# Patient Record
Sex: Female | Born: 1945 | Race: White | Hispanic: No | Marital: Married | Smoking: Never smoker
Health system: Southern US, Community
[De-identification: ages and names within clinical notes are randomized; demographics above are authoritative.]

## PROBLEM LIST (undated history)

## (undated) DIAGNOSIS — I10 Essential (primary) hypertension: Secondary | ICD-10-CM

## (undated) DIAGNOSIS — I890 Lymphedema, not elsewhere classified: Secondary | ICD-10-CM

## (undated) DIAGNOSIS — E7800 Pure hypercholesterolemia, unspecified: Secondary | ICD-10-CM

## (undated) DIAGNOSIS — E785 Hyperlipidemia, unspecified: Secondary | ICD-10-CM

## (undated) DIAGNOSIS — L03119 Cellulitis of unspecified part of limb: Secondary | ICD-10-CM

## (undated) DIAGNOSIS — I509 Heart failure, unspecified: Secondary | ICD-10-CM

## (undated) DIAGNOSIS — L409 Psoriasis, unspecified: Secondary | ICD-10-CM

## (undated) DIAGNOSIS — R011 Cardiac murmur, unspecified: Secondary | ICD-10-CM

## (undated) DIAGNOSIS — R7989 Other specified abnormal findings of blood chemistry: Secondary | ICD-10-CM

## (undated) HISTORY — DX: Cardiac murmur, unspecified: R01.1

## (undated) HISTORY — PX: ANKLE FRACTURE SURGERY: SHX122

## (undated) HISTORY — DX: Hyperlipidemia, unspecified: E78.5

## (undated) HISTORY — PX: CATARACT EXTRACTION: SUR2

## (undated) HISTORY — DX: Lymphedema, not elsewhere classified: I89.0

## (undated) HISTORY — PX: ABDOMINAL HERNIA REPAIR: SHX539

## (undated) HISTORY — PX: CHOLECYSTECTOMY: SHX55

## (undated) HISTORY — DX: Essential (primary) hypertension: I10

## (undated) HISTORY — DX: Heart failure, unspecified: I50.9

## (undated) HISTORY — DX: Pure hypercholesterolemia, unspecified: E78.00

## (undated) HISTORY — DX: Psoriasis, unspecified: L40.9

## (undated) HISTORY — DX: Cellulitis of unspecified part of limb: L03.119

## (undated) HISTORY — DX: Other specified abnormal findings of blood chemistry: R79.89

## (undated) HISTORY — PX: ABDOMINAL HYSTERECTOMY: SHX81

---

## 2009-09-24 ENCOUNTER — Inpatient Hospital Stay (HOSPITAL_COMMUNITY): Admission: EM | Admit: 2009-09-24 | Discharge: 2009-09-29 | Payer: Self-pay | Admitting: Emergency Medicine

## 2011-02-09 LAB — COMPREHENSIVE METABOLIC PANEL WITH GFR
AST: 22 U/L (ref 0–37)
Albumin: 3.7 g/dL (ref 3.5–5.2)
Alkaline Phosphatase: 84 U/L (ref 39–117)
BUN: 8 mg/dL (ref 6–23)
Chloride: 107 meq/L (ref 96–112)
GFR calc Af Amer: >60 mL/min (ref >60)
Potassium: 3.4 meq/L — ABNORMAL LOW (ref 3.5–5.1)
Total Bilirubin: 1.8 mg/dL — ABNORMAL HIGH (ref 0.3–1.2)

## 2011-02-09 LAB — MAGNESIUM: Magnesium: 2 mg/dL (ref 1.5–2.5)

## 2011-02-09 LAB — DIFFERENTIAL
Basophils Absolute: 0 10*3/uL (ref 0.0–0.1)
Basophils Relative: 0 % (ref 0–1)
Eosinophils Absolute: 0 10*3/uL (ref 0.0–0.7)
Eosinophils Relative: 0 % (ref 0–5)
Lymphocytes Relative: 11 % — ABNORMAL LOW (ref 12–46)
Lymphs Abs: 1 10*3/uL (ref 0.7–4.0)
Monocytes Absolute: 0.7 10*3/uL (ref 0.1–1.0)
Monocytes Relative: 8 % (ref 3–12)

## 2011-02-09 LAB — COMPREHENSIVE METABOLIC PANEL
ALT: 25 U/L (ref 0–35)
CO2: 26 mEq/L (ref 19–32)
Calcium: 10.1 mg/dL (ref 8.4–10.5)
Creatinine, Ser: 0.66 mg/dL (ref 0.4–1.2)
GFR calc non Af Amer: >60 mL/min (ref >60)
Glucose, Bld: 135 mg/dL — ABNORMAL HIGH (ref 70–99)
Sodium: 140 mEq/L (ref 135–145)
Total Protein: 7.8 g/dL (ref 6.0–8.3)

## 2011-02-09 LAB — URINE CULTURE

## 2011-02-09 LAB — CBC
HCT: 42 % (ref 36.0–46.0)
Hemoglobin: 14.5 g/dL (ref 12.0–15.0)
MCV: 91.9 fL (ref 78.0–100.0)
MCV: 91.9 fL (ref 78.0–100.0)
Platelets: 160 10*3/uL (ref 150–400)
RBC: 4.56 MIL/uL (ref 3.87–5.11)
RDW: 14.1 % (ref 11.5–15.5)
WBC: 8.7 10*3/uL (ref 4.0–10.5)
WBC: 9 10*3/uL (ref 4.0–10.5)

## 2011-02-09 LAB — BASIC METABOLIC PANEL
BUN: 8 mg/dL (ref 6–23)
Creatinine, Ser: 0.63 mg/dL (ref 0.4–1.2)
GFR calc non Af Amer: >60 mL/min (ref >60)

## 2011-02-09 LAB — URINALYSIS, ROUTINE W REFLEX MICROSCOPIC
Nitrite: POSITIVE — AB
Specific Gravity, Urine: 1.023 (ref 1.005–1.030)
pH: 6 (ref 5.0–8.0)

## 2011-02-09 LAB — PROTIME-INR: INR: 1.11 (ref 0.00–1.49)

## 2011-02-09 LAB — URINE MICROSCOPIC-ADD ON

## 2011-02-09 LAB — APTT: aPTT: 28 seconds (ref 24–37)

## 2011-11-05 IMAGING — CR DG ABDOMEN 1V
1 series · 1 of 1 positions shown · non-contrast
Comparison: 09/27/2009

CLINICAL DATA: Small bowel obstruction.

ABDOMEN - 1 VIEW

[w abdomen upright *]
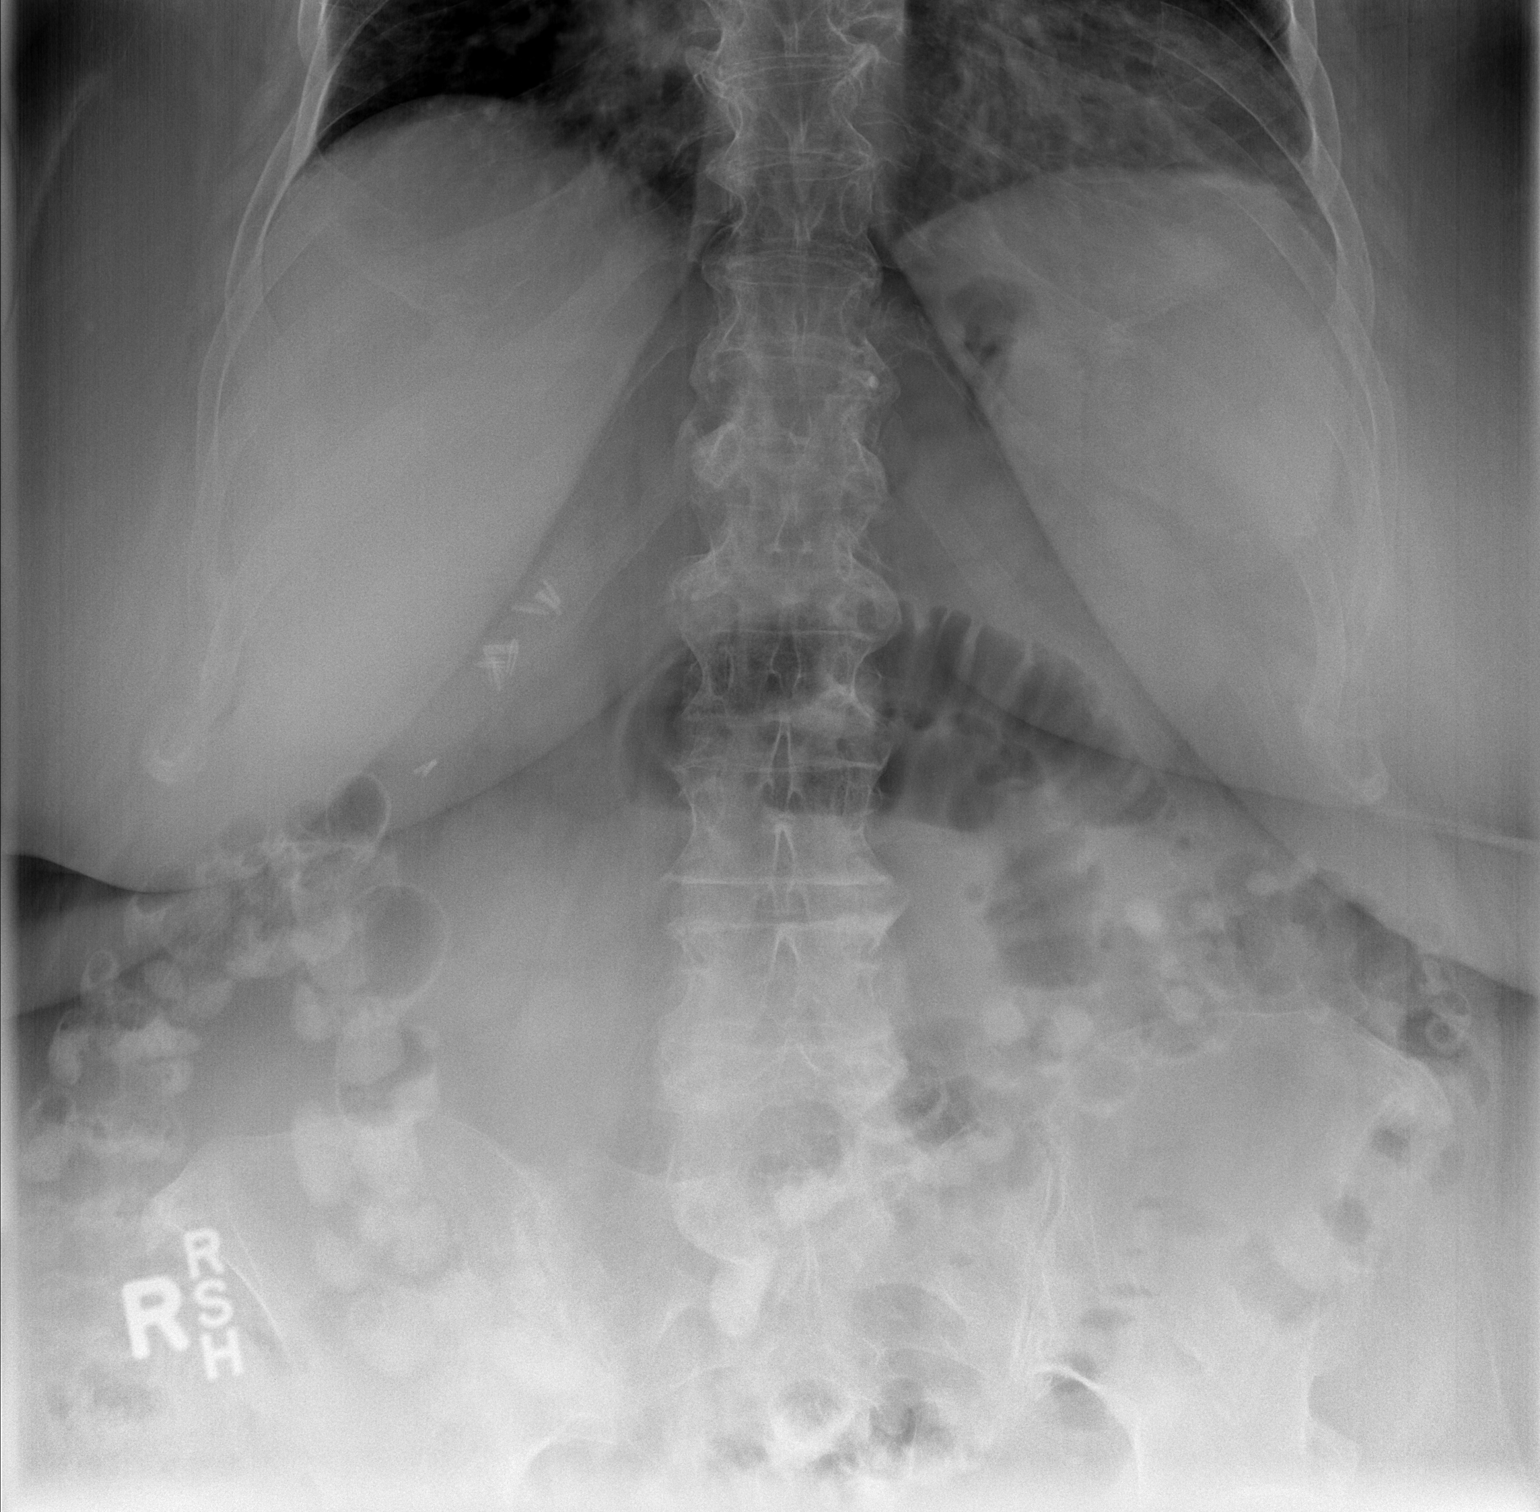

[1 of 1 positions shown; findings below may reference images not displayed]

FINDINGS: Dilated loops of small bowel are seen in the mid abdomen,
with associated air fluid levels.  Colon is nondistended and
contains oral contrast.  Surgical clips are seen in the right upper
quadrant.
IMPRESSION: Bowel gas pattern suggests persistent partial small bowel
obstruction.

## 2021-05-12 ENCOUNTER — Ambulatory Visit (INDEPENDENT_AMBULATORY_CARE_PROVIDER_SITE_OTHER): Payer: Medicare Other | Admitting: Dermatology

## 2021-05-12 ENCOUNTER — Other Ambulatory Visit: Payer: Self-pay

## 2021-05-12 ENCOUNTER — Encounter: Payer: Self-pay | Admitting: Dermatology

## 2021-05-12 DIAGNOSIS — C4492 Squamous cell carcinoma of skin, unspecified: Secondary | ICD-10-CM

## 2021-05-12 DIAGNOSIS — L409 Psoriasis, unspecified: Secondary | ICD-10-CM | POA: Diagnosis not present

## 2021-05-12 DIAGNOSIS — L281 Prurigo nodularis: Secondary | ICD-10-CM | POA: Diagnosis not present

## 2021-05-12 DIAGNOSIS — D0472 Carcinoma in situ of skin of left lower limb, including hip: Secondary | ICD-10-CM

## 2021-05-12 DIAGNOSIS — D485 Neoplasm of uncertain behavior of skin: Secondary | ICD-10-CM

## 2021-05-12 HISTORY — DX: Squamous cell carcinoma of skin, unspecified: C44.92

## 2021-05-12 MED ORDER — CLOBETASOL PROPIONATE 0.05 % EX LOTN: 1.0000 "application " | TOPICAL_LOTION | Freq: Every day | CUTANEOUS | 0 refills | Status: DC

## 2021-05-12 MED ORDER — MUPIROCIN 2 % EX OINT: 1.0000 "application " | TOPICAL_OINTMENT | Freq: Every day | CUTANEOUS | 0 refills | Status: DC

## 2021-05-12 MED ORDER — CALCIPOTRIENE 0.005 % EX CREA: TOPICAL_CREAM | Freq: Every day | CUTANEOUS | 0 refills | Status: DC

## 2021-05-12 NOTE — Patient Instructions (Signed)

## 2021-05-20 ENCOUNTER — Telehealth: Payer: Self-pay | Admitting: *Deleted

## 2021-05-20 NOTE — Telephone Encounter (Signed)
Path to patient made surgery with Dr.Tafeen.

## 2021-05-20 NOTE — Telephone Encounter (Signed)
-----   Message from [name], MD sent at 05/20/2021  6:37 AM EDT ----- Schedule surgery with Dr. Darene Lamer

## 2021-05-20 NOTE — Telephone Encounter (Signed)
No Voicemail set up.

## 2021-05-26 ENCOUNTER — Encounter: Payer: Self-pay | Admitting: Dermatology

## 2021-05-26 NOTE — Progress Notes (Signed)
   New Patient   Subjective  Carrie Guzman is a 75 y.o. female who presents for the following: New Patient (Initial Visit) (Patient here today for psoriasis x 10 years current treatment Clobetasol Lotion and Calcipotriene Cream (given by Dermatologist in [place]) per patient the treatment does help. Psoriasis on upper back. No personal history or family history of atypical moles, melanoma or non mole skin cancer. ).  Psoriasis mainly on back of scalp neck, nonhealing crust on left leg. Location:  Duration:  Quality:  Associated Signs/Symptoms: Modifying Factors:  Severity:  Timing: Context:    The following portions of the chart were reviewed this encounter and updated as appropriate:      Objective  Well appearing patient in no apparent distress; mood and affect are within normal limits. 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     A full examination was performed including scalp, head, eyes, ears, nose, lips, neck, chest, axillae, abdomen, back, buttocks, bilateral upper extremities, bilateral lower extremities, hands, feet, fingers, toes, fingernails, and toenails. All findings within normal limits unless otherwise noted below.  Is beneath undergarments not fully examined.   Assessment & Plan  Neoplasm of uncertain behavior of skin Left Lower Leg - Anterior  Skin / nail biopsy Type of biopsy: tangential   Informed consent: discussed and consent obtained   Timeout: patient name, date of birth, surgical site, and procedure verified   Procedure prep:  Patient was prepped and draped in usual sterile fashion (Non sterile) Prep type:  Chlorhexidine Anesthesia: the lesion was anesthetized in a standard fashion   Anesthetic:  1%  lidocaine w/ epinephrine 1-100,000 local infiltration Instrument used: flexible razor blade   Hemostasis achieved with: ferric subsulfate   Outcome: patient tolerated procedure well   Post-procedure details: sterile dressing applied and wound care instructions given   Dressing type: pressure dressing and petrolatum    Specimen 1 - Surgical pathology Differential Diagnosis: R/O BCC vs SCC  Check Margins: No  Related Medications mupirocin ointment (BACTROBAN) 2 % Apply 1 application topically daily.  Prurigo nodularis Left Cymba  Discussed the difficulty of stopping a chronic scratch-itch cycle.  Use topical samples of Pandel x 3 samples given Lot # [id number] Exp 08/22  Psoriasis Neck - Posterior  Again discussed how scratching will prevent psoriasis from improving.  Okay use of clobetasol daily for 1 month and taper if improved.  Avoid use on face.  Related Medications Clobetasol Propionate 0.05 % lotion Apply 1 application topically daily.  calcipotriene (DOVONOX) 0.005 % cream Apply topically daily.

## 2021-07-22 ENCOUNTER — Other Ambulatory Visit: Payer: Self-pay

## 2021-07-22 ENCOUNTER — Encounter: Payer: Self-pay | Admitting: Dermatology

## 2021-07-22 ENCOUNTER — Ambulatory Visit (INDEPENDENT_AMBULATORY_CARE_PROVIDER_SITE_OTHER): Payer: Medicare Other | Admitting: Dermatology

## 2021-07-22 DIAGNOSIS — D099 Carcinoma in situ, unspecified: Secondary | ICD-10-CM

## 2021-07-22 DIAGNOSIS — D0472 Carcinoma in situ of skin of left lower limb, including hip: Secondary | ICD-10-CM

## 2021-07-22 NOTE — Patient Instructions (Signed)

## 2021-08-02 ENCOUNTER — Encounter: Payer: Self-pay | Admitting: Dermatology

## 2021-08-02 NOTE — Progress Notes (Signed)
   Follow-Up Visit   Subjective  Carrie Guzman is a 75 y.o. female who presents for the following: Procedure (Here for treatment- left lower leg- anterior- cis x 1).  Carcinoma in situ left shin Location:  Duration:  Quality:  Associated Signs/Symptoms: Modifying Factors:  Severity:  Timing: Context:   Objective  Well appearing patient in no apparent distress; mood and affect are within normal limits. Left Lower Leg - Anterior Lesion identified by Dr.Adayah Arocho and nurse in room.      A focused examination was performed including legs, arms.. Relevant physical exam findings are noted in the Assessment and Plan.   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  Destruction of lesion Complexity: simple   Destruction method: electrodesiccation and curettage   Informed consent: discussed and consent obtained   Timeout:  patient name, date of birth, surgical site, and procedure verified Anesthesia: the lesion was anesthetized in a standard fashion   Anesthetic:  1% lidocaine w/ epinephrine 1-100,000 local infiltration Curettage performed in three different directions: Yes   Curettage cycles:  3 Lesion length (cm):  1.5 Lesion width (cm):  1.5 Margin per side (cm):  0 Final wound size (cm):  1.5 Hemostasis achieved with:  ferric subsulfate Outcome: patient tolerated procedure well with no complications   Post-procedure details: sterile dressing applied and wound care instructions given   Dressing type: pressure dressing and petrolatum   Additional details:  Wound innoculated with 5 fluorouracil solution.      I, [name], MD, have reviewed all documentation for this visit.  The documentation on 08/02/21 for the exam, diagnosis, procedures, and orders are all accurate and complete.

## 2021-08-04 ENCOUNTER — Telehealth: Payer: Self-pay | Admitting: Dermatology

## 2021-08-04 DIAGNOSIS — L409 Psoriasis, unspecified: Secondary | ICD-10-CM

## 2021-08-04 NOTE — Telephone Encounter (Signed)
At 07/22/21 ov asked ST for refill of Calcipotriene but it wasn't sent in to [facility], [employer].

## 2021-08-05 MED ORDER — CALCIPOTRIENE 0.005 % EX CREA: TOPICAL_CREAM | Freq: Every day | CUTANEOUS | 0 refills | Status: DC

## 2021-08-26 ENCOUNTER — Ambulatory Visit (INDEPENDENT_AMBULATORY_CARE_PROVIDER_SITE_OTHER): Payer: Medicare Other | Admitting: Dermatology

## 2021-08-26 ENCOUNTER — Encounter: Payer: Self-pay | Admitting: Dermatology

## 2021-08-26 ENCOUNTER — Other Ambulatory Visit: Payer: Self-pay

## 2021-08-26 DIAGNOSIS — S81802D Unspecified open wound, left lower leg, subsequent encounter: Secondary | ICD-10-CM

## 2021-09-11 ENCOUNTER — Encounter: Payer: Self-pay | Admitting: Dermatology

## 2021-09-11 NOTE — Progress Notes (Signed)
   Follow-Up Visit   Subjective  Carrie Guzman is a 74 y.o. female who presents for the following: Follow-up (4 week Follow up left lower leg 9/15 treatment cis ).  Check healing of CIS surgery and chronic leg swelling on my exam Location:  Duration:  Quality:  Associated Signs/Symptoms: Modifying Factors:  Severity:  Timing: Context:   Objective  Well appearing patient in no apparent distress; mood and affect are within normal limits. Left Lower Leg - Anterior Follow up cis surgery from September        A focused examination was performed including the leg. Relevant physical exam findings are noted in the Assessment and Plan.   Assessment & Plan    Wound of left leg, subsequent encounter Left Lower Leg - Anterior  No signs of infection continue use of Mupirocin. Follow up in 1 month      I, [name], MD, have reviewed all documentation for this visit.  The documentation on 09/11/21 for the exam, diagnosis, procedures, and orders are all accurate and complete.

## 2021-10-04 ENCOUNTER — Ambulatory Visit (INDEPENDENT_AMBULATORY_CARE_PROVIDER_SITE_OTHER): Payer: Medicare Other | Admitting: Dermatology

## 2021-10-04 ENCOUNTER — Other Ambulatory Visit: Payer: Self-pay

## 2021-10-04 ENCOUNTER — Encounter: Payer: Self-pay | Admitting: Dermatology

## 2021-10-04 DIAGNOSIS — L409 Psoriasis, unspecified: Secondary | ICD-10-CM | POA: Diagnosis not present

## 2021-10-04 DIAGNOSIS — Z4800 Encounter for change or removal of nonsurgical wound dressing: Secondary | ICD-10-CM

## 2021-10-04 MED ORDER — CLOBETASOL PROP EMOLLIENT BASE 0.05 % EX CREA: TOPICAL_CREAM | CUTANEOUS | 6 refills | Status: DC

## 2021-10-22 ENCOUNTER — Encounter: Payer: Self-pay | Admitting: Dermatology

## 2021-10-22 NOTE — Progress Notes (Signed)
° °  Follow-Up Visit   Subjective  Carrie Guzman is a 75 y.o. female who presents for the following: Skin Problem (Follow up on left lower leg- seems better Tx-mupirocin cream-).  Recheck spot on lower leg, discussed psoriasis. Location:  Duration:  Quality:  Associated Signs/Symptoms: Modifying Factors:  Severity:  Timing: Context:   Objective  Well appearing patient in no apparent distress; mood and affect are within normal limits. Mid Back Patchy small plaque psoriasis, less than 5% body surface area.  Left Lower Leg - Anterior No ulcerations- wound improved    A focused examination was performed including arms, legs, head, neck, back,. Relevant physical exam findings are noted in the Assessment and Plan.   Assessment & Plan    Psoriasis Mid Back  Clobetasol daily after bathing for up to 4 weeks; avoid use on face and body folds.  If improves taper use.  Follow-up for status report by phone in 1 month.  Clobetasol Prop Emollient Base (CLOBETASOL PROPIONATE E) 0.05 % emollient cream - Mid Back Apply to affected area qd- not for face, folds or groin  Related Medications Clobetasol Propionate 0.05 % lotion Apply 1 application topically daily.  calcipotriene (DOVONOX) 0.005 % cream Apply topically daily.  Encounter for change or removal of nonsurgical wound dressing Left Lower Leg - Anterior  Recheck if there is recurrent ulceration, bleeding, growth.      I, Carrie Monarch, MD, have reviewed all documentation for this visit.  The documentation on 10/22/21 for the exam, diagnosis, procedures, and orders are all accurate and complete.

## 2023-06-21 ENCOUNTER — Encounter (HOSPITAL_BASED_OUTPATIENT_CLINIC_OR_DEPARTMENT_OTHER): Payer: Self-pay | Admitting: Emergency Medicine

## 2023-06-21 ENCOUNTER — Other Ambulatory Visit: Payer: Self-pay

## 2023-06-21 ENCOUNTER — Inpatient Hospital Stay (HOSPITAL_BASED_OUTPATIENT_CLINIC_OR_DEPARTMENT_OTHER)
Admission: EM | Admit: 2023-06-21 | Discharge: 2023-07-06 | DRG: 291 | Disposition: A | Discharge status: Home / Self Care | Payer: Medicare Other | Attending: Internal Medicine | Admitting: Internal Medicine

## 2023-06-21 ENCOUNTER — Emergency Department (HOSPITAL_BASED_OUTPATIENT_CLINIC_OR_DEPARTMENT_OTHER): Payer: Medicare Other | Admitting: Radiology

## 2023-06-21 DIAGNOSIS — I83893 Varicose veins of bilateral lower extremities with other complications: Secondary | ICD-10-CM | POA: Diagnosis present

## 2023-06-21 DIAGNOSIS — Z79899 Other long term (current) drug therapy: Secondary | ICD-10-CM

## 2023-06-21 DIAGNOSIS — L03115 Cellulitis of right lower limb: Secondary | ICD-10-CM | POA: Diagnosis present

## 2023-06-21 DIAGNOSIS — I110 Hypertensive heart disease with heart failure: Principal | ICD-10-CM | POA: Diagnosis present

## 2023-06-21 DIAGNOSIS — I700 Atherosclerosis of aorta: Secondary | ICD-10-CM | POA: Diagnosis present

## 2023-06-21 DIAGNOSIS — I5031 Acute diastolic (congestive) heart failure: Secondary | ICD-10-CM

## 2023-06-21 DIAGNOSIS — D6959 Other secondary thrombocytopenia: Secondary | ICD-10-CM | POA: Diagnosis present

## 2023-06-21 DIAGNOSIS — I5021 Acute systolic (congestive) heart failure: Secondary | ICD-10-CM | POA: Diagnosis present

## 2023-06-21 DIAGNOSIS — I959 Hypotension, unspecified: Secondary | ICD-10-CM | POA: Diagnosis not present

## 2023-06-21 DIAGNOSIS — E662 Morbid (severe) obesity with alveolar hypoventilation: Secondary | ICD-10-CM | POA: Diagnosis present

## 2023-06-21 DIAGNOSIS — L89151 Pressure ulcer of sacral region, stage 1: Secondary | ICD-10-CM | POA: Diagnosis not present

## 2023-06-21 DIAGNOSIS — E0590 Thyrotoxicosis, unspecified without thyrotoxic crisis or storm: Secondary | ICD-10-CM | POA: Diagnosis present

## 2023-06-21 DIAGNOSIS — K760 Fatty (change of) liver, not elsewhere classified: Secondary | ICD-10-CM | POA: Diagnosis present

## 2023-06-21 DIAGNOSIS — I50813 Acute on chronic right heart failure: Secondary | ICD-10-CM | POA: Diagnosis present

## 2023-06-21 DIAGNOSIS — J9601 Acute respiratory failure with hypoxia: Secondary | ICD-10-CM | POA: Diagnosis present

## 2023-06-21 DIAGNOSIS — Z86711 Personal history of pulmonary embolism: Secondary | ICD-10-CM | POA: Diagnosis not present

## 2023-06-21 DIAGNOSIS — I890 Lymphedema, not elsewhere classified: Secondary | ICD-10-CM | POA: Diagnosis present

## 2023-06-21 DIAGNOSIS — L03116 Cellulitis of left lower limb: Secondary | ICD-10-CM | POA: Diagnosis present

## 2023-06-21 DIAGNOSIS — E8770 Fluid overload, unspecified: Secondary | ICD-10-CM | POA: Diagnosis not present

## 2023-06-21 DIAGNOSIS — E876 Hypokalemia: Secondary | ICD-10-CM | POA: Diagnosis not present

## 2023-06-21 DIAGNOSIS — I2489 Other forms of acute ischemic heart disease: Secondary | ICD-10-CM | POA: Diagnosis present

## 2023-06-21 DIAGNOSIS — J810 Acute pulmonary edema: Secondary | ICD-10-CM

## 2023-06-21 DIAGNOSIS — K761 Chronic passive congestion of liver: Secondary | ICD-10-CM | POA: Diagnosis present

## 2023-06-21 DIAGNOSIS — I272 Pulmonary hypertension, unspecified: Secondary | ICD-10-CM | POA: Diagnosis not present

## 2023-06-21 DIAGNOSIS — R0902 Hypoxemia: Secondary | ICD-10-CM

## 2023-06-21 DIAGNOSIS — Z1152 Encounter for screening for COVID-19: Secondary | ICD-10-CM | POA: Diagnosis not present

## 2023-06-21 DIAGNOSIS — I5032 Chronic diastolic (congestive) heart failure: Secondary | ICD-10-CM

## 2023-06-21 DIAGNOSIS — I2609 Other pulmonary embolism with acute cor pulmonale: Secondary | ICD-10-CM | POA: Diagnosis present

## 2023-06-21 DIAGNOSIS — I440 Atrioventricular block, first degree: Secondary | ICD-10-CM | POA: Diagnosis present

## 2023-06-21 DIAGNOSIS — R17 Unspecified jaundice: Secondary | ICD-10-CM

## 2023-06-21 DIAGNOSIS — J9811 Atelectasis: Secondary | ICD-10-CM | POA: Diagnosis not present

## 2023-06-21 DIAGNOSIS — I3139 Other pericardial effusion (noninflammatory): Secondary | ICD-10-CM | POA: Diagnosis present

## 2023-06-21 DIAGNOSIS — Z7901 Long term (current) use of anticoagulants: Secondary | ICD-10-CM

## 2023-06-21 DIAGNOSIS — D696 Thrombocytopenia, unspecified: Secondary | ICD-10-CM | POA: Diagnosis not present

## 2023-06-21 DIAGNOSIS — I452 Bifascicular block: Secondary | ICD-10-CM | POA: Diagnosis present

## 2023-06-21 DIAGNOSIS — Z85828 Personal history of other malignant neoplasm of skin: Secondary | ICD-10-CM

## 2023-06-21 DIAGNOSIS — I50811 Acute right heart failure: Secondary | ICD-10-CM | POA: Diagnosis not present

## 2023-06-21 DIAGNOSIS — D7589 Other specified diseases of blood and blood-forming organs: Secondary | ICD-10-CM | POA: Diagnosis present

## 2023-06-21 DIAGNOSIS — I509 Heart failure, unspecified: Secondary | ICD-10-CM | POA: Diagnosis not present

## 2023-06-21 DIAGNOSIS — Z6841 Body Mass Index (BMI) 40.0 and over, adult: Secondary | ICD-10-CM

## 2023-06-21 DIAGNOSIS — E7800 Pure hypercholesterolemia, unspecified: Secondary | ICD-10-CM | POA: Diagnosis present

## 2023-06-21 DIAGNOSIS — T501X5A Adverse effect of loop [high-ceiling] diuretics, initial encounter: Secondary | ICD-10-CM | POA: Diagnosis present

## 2023-06-21 DIAGNOSIS — I2721 Secondary pulmonary arterial hypertension: Secondary | ICD-10-CM | POA: Diagnosis present

## 2023-06-21 DIAGNOSIS — R0602 Shortness of breath: Secondary | ICD-10-CM | POA: Diagnosis present

## 2023-06-21 DIAGNOSIS — E0781 Sick-euthyroid syndrome: Secondary | ICD-10-CM | POA: Diagnosis present

## 2023-06-21 DIAGNOSIS — I10 Essential (primary) hypertension: Secondary | ICD-10-CM | POA: Diagnosis not present

## 2023-06-21 DIAGNOSIS — I2510 Atherosclerotic heart disease of native coronary artery without angina pectoris: Secondary | ICD-10-CM | POA: Diagnosis present

## 2023-06-21 LAB — PROTIME-INR
INR: 1.3 — ABNORMAL HIGH (ref 0.8–1.2)
Prothrombin Time: 16.2 seconds — ABNORMAL HIGH (ref 11.4–15.2)

## 2023-06-21 LAB — I-STAT VENOUS BLOOD GAS, ED
Acid-Base Excess: 2 mmol/L (ref 0.0–2.0)
Bicarbonate: 29 mmol/L — ABNORMAL HIGH (ref 20.0–28.0)
Calcium, Ion: 1.39 mmol/L (ref 1.15–1.40)
HCT: 45 % (ref 36.0–46.0)
Hemoglobin: 15.3 g/dL — ABNORMAL HIGH (ref 12.0–15.0)
O2 Saturation: 46 %
Patient temperature: 97.9
Potassium: 3.1 mmol/L — ABNORMAL LOW (ref 3.5–5.1)
Sodium: 146 mmol/L — ABNORMAL HIGH (ref 135–145)
TCO2: 31 mmol/L (ref 22–32)
pCO2, Ven: 53.2 mmHg (ref 44–60)
pH, Ven: 7.342 (ref 7.25–7.43)
pO2, Ven: 27 mmHg — CL (ref 32–45)

## 2023-06-21 LAB — BASIC METABOLIC PANEL
Anion gap: 13 (ref 5–15)
BUN: 28 mg/dL — ABNORMAL HIGH (ref 8–23)
CO2: 22 mmol/L (ref 22–32)
Calcium: 11.5 mg/dL — ABNORMAL HIGH (ref 8.9–10.3)
Chloride: 109 mmol/L (ref 98–111)
Creatinine, Ser: 0.83 mg/dL (ref 0.44–1.00)
GFR, Estimated: >60 mL/min (ref >60)
Glucose, Bld: 131 mg/dL — ABNORMAL HIGH (ref 70–99)
Potassium: 4.3 mmol/L (ref 3.5–5.1)
Sodium: 144 mmol/L (ref 135–145)

## 2023-06-21 LAB — HEPATIC FUNCTION PANEL
ALT: 14 U/L (ref 0–44)
AST: 43 U/L — ABNORMAL HIGH (ref 15–41)
Albumin: 4 g/dL (ref 3.5–5.0)
Alkaline Phosphatase: 77 U/L (ref 38–126)
Bilirubin, Direct: 1.4 mg/dL — ABNORMAL HIGH (ref 0.0–0.2)
Indirect Bilirubin: 4.4 mg/dL — ABNORMAL HIGH (ref 0.3–0.9)
Total Bilirubin: 5.8 mg/dL — ABNORMAL HIGH (ref 0.3–1.2)
Total Protein: 7.5 g/dL (ref 6.5–8.1)

## 2023-06-21 LAB — CBC
HCT: 49.2 % — ABNORMAL HIGH (ref 36.0–46.0)
Hemoglobin: 15.9 g/dL — ABNORMAL HIGH (ref 12.0–15.0)
MCH: 32.6 pg (ref 26.0–34.0)
MCHC: 32.3 g/dL (ref 30.0–36.0)
MCV: 100.8 fL — ABNORMAL HIGH (ref 80.0–100.0)
Platelets: 111 10*3/uL — ABNORMAL LOW (ref 150–400)
RBC: 4.88 MIL/uL (ref 3.87–5.11)
RDW: 18.4 % — ABNORMAL HIGH (ref 11.5–15.5)
WBC: 7.6 10*3/uL (ref 4.0–10.5)
nRBC: 0 % (ref 0.0–0.2)

## 2023-06-21 LAB — TROPONIN I (HIGH SENSITIVITY)
Troponin I (High Sensitivity): 22 ng/L — ABNORMAL HIGH (ref <18)
Troponin I (High Sensitivity): 27 ng/L — ABNORMAL HIGH (ref <18)

## 2023-06-21 LAB — AMMONIA: Ammonia: 24 umol/L (ref 9–35)

## 2023-06-21 LAB — TSH: TSH: 0.252 u[IU]/mL — ABNORMAL LOW (ref 0.350–4.500)

## 2023-06-21 LAB — SARS CORONAVIRUS 2 BY RT PCR: SARS Coronavirus 2 by RT PCR: NEGATIVE

## 2023-06-21 LAB — ACETAMINOPHEN LEVEL: Acetaminophen (Tylenol), Serum: 16 ug/mL (ref 10–30)

## 2023-06-21 LAB — BRAIN NATRIURETIC PEPTIDE: B Natriuretic Peptide: 1771.2 pg/mL — ABNORMAL HIGH (ref 0.0–100.0)

## 2023-06-21 MED ORDER — ONDANSETRON HCL 4 MG/2ML IJ SOLN: 4.0000 mg | Freq: Four times a day (QID) | INTRAMUSCULAR | Status: DC | PRN

## 2023-06-21 MED ORDER — POTASSIUM CHLORIDE CRYS ER 20 MEQ PO TBCR
40.0000 meq | EXTENDED_RELEASE_TABLET | ORAL | Status: AC
  Administered 2023-06-21 (×2): 40 meq via ORAL
  Filled 2023-06-21 (×2): qty 2

## 2023-06-21 MED ORDER — ACETAMINOPHEN 650 MG RE SUPP: 650.0000 mg | Freq: Four times a day (QID) | RECTAL | Status: DC | PRN

## 2023-06-21 MED ORDER — LOSARTAN POTASSIUM 50 MG PO TABS
50.0000 mg | ORAL_TABLET | Freq: Every day | ORAL | Status: DC
  Administered 2023-06-21 – 2023-06-22 (×2): 50 mg via ORAL
  Filled 2023-06-21 (×2): qty 1

## 2023-06-21 MED ORDER — FUROSEMIDE 10 MG/ML IJ SOLN
40.0000 mg | Freq: Two times a day (BID) | INTRAMUSCULAR | Status: DC
  Administered 2023-06-21 – 2023-06-22 (×2): 40 mg via INTRAVENOUS
  Filled 2023-06-21 (×2): qty 4

## 2023-06-21 MED ORDER — ASPIRIN 81 MG PO TBEC
81.0000 mg | DELAYED_RELEASE_TABLET | Freq: Every day | ORAL | Status: DC
  Administered 2023-06-21 – 2023-06-23 (×3): 81 mg via ORAL
  Filled 2023-06-21 (×3): qty 1

## 2023-06-21 MED ORDER — ACETAMINOPHEN 325 MG PO TABS: 650.0000 mg | ORAL_TABLET | Freq: Four times a day (QID) | ORAL | Status: DC | PRN

## 2023-06-21 MED ORDER — ROSUVASTATIN CALCIUM 10 MG PO TABS
10.0000 mg | ORAL_TABLET | Freq: Every day | ORAL | Status: DC
  Administered 2023-06-21 – 2023-07-06 (×16): 10 mg via ORAL
  Filled 2023-06-21 (×16): qty 1

## 2023-06-21 MED ORDER — CARVEDILOL 6.25 MG PO TABS
6.2500 mg | ORAL_TABLET | Freq: Two times a day (BID) | ORAL | Status: DC
  Administered 2023-06-23 (×2): 6.25 mg via ORAL
  Filled 2023-06-21 (×4): qty 1

## 2023-06-21 MED ORDER — ENOXAPARIN SODIUM 60 MG/0.6ML IJ SOSY
60.0000 mg | PREFILLED_SYRINGE | INTRAMUSCULAR | Status: DC
  Administered 2023-06-21 – 2023-06-22 (×2): 60 mg via SUBCUTANEOUS
  Filled 2023-06-21 (×2): qty 0.6

## 2023-06-21 MED ORDER — ONDANSETRON HCL 4 MG PO TABS: 4.0000 mg | ORAL_TABLET | Freq: Four times a day (QID) | ORAL | Status: DC | PRN

## 2023-06-21 MED ORDER — POTASSIUM CHLORIDE CRYS ER 20 MEQ PO TBCR: 40.0000 meq | EXTENDED_RELEASE_TABLET | Freq: Every day | ORAL | Status: DC

## 2023-06-21 MED ORDER — TRAZODONE HCL 50 MG PO TABS: 25.0000 mg | ORAL_TABLET | Freq: Every evening | ORAL | Status: DC | PRN

## 2023-06-21 MED ORDER — ALBUTEROL SULFATE (2.5 MG/3ML) 0.083% IN NEBU: 2.5000 mg | INHALATION_SOLUTION | RESPIRATORY_TRACT | Status: DC | PRN

## 2023-06-21 MED ORDER — FUROSEMIDE 10 MG/ML IJ SOLN
40.0000 mg | INTRAMUSCULAR | Status: AC
  Administered 2023-06-21: 40 mg via INTRAVENOUS
  Filled 2023-06-21: qty 4

## 2023-06-21 NOTE — ED Notes (Signed)
Thomas at [facility] will send transport for Bed Ready at [id number](NS)

## 2023-06-21 NOTE — ED Triage Notes (Signed)
Presents for SOB x 1 week. Was diagnosed with "fluid on lungs and an enlarged heart" and given referral for cardiology but no medications at PCP 2 days ago. Unable to get a reading with pulse oximeter at home which prompted ER visit.   Pt takes 40 mg lasix daily with some missed doses.   Denies orthopnea, fever, CP Endorses dry cough,

## 2023-06-21 NOTE — ED Provider Notes (Addendum)
[place] [facility] AT [facility] Provider Note   CSN: [id number] Arrival date & time: 06/21/23  1136     History  Chief Complaint  Patient presents with   Shortness of Breath    Carrie Guzman is a 77 y.o. female.  77 year old female with a history of elevated BMI, hyperlipidemia, hypertension, and lower extremity swelling on Lasix who presents to the emergency department with shortness of breath.  For the past week the patient reports that she has been short of breath with a dry cough.  Has also noted some worsening lower extremity swelling.  2 days ago went to [facility] and was diagnosed with some fluid on the lungs and an enlarged heart.  They told her to follow-up with cardiology in 3 days.  Says that she has been taking her Lasix 40 mg daily but did miss several doses recently.  No fevers, chest pain, runny nose, or sore throat.  Has 3 pillow orthopnea that is at baseline.  No diagnosis of heart failure.       Home Medications Prior to Admission medications   Medication Sig Start Date End Date Taking? Authorizing Provider  atenolol (TENORMIN) 50 MG tablet Take by mouth. 03/02/18   [provider]  calcipotriene (DOVONOX) 0.005 % cream Apply topically daily. 08/05/21   Janalyn Harder, MD  Clobetasol Prop Emollient Base (CLOBETASOL PROPIONATE E) 0.05 % emollient cream Apply to affected area qd- not for face, folds or groin 10/04/21   Janalyn Harder, MD  Clobetasol Propionate 0.05 % lotion Apply 1 application topically daily. 05/12/21   Janalyn Harder, MD  clotrimazole-betamethasone (LOTRISONE) lotion Apply topically. 03/02/18   [provider]  furosemide (LASIX) 40 MG tablet Take 1 tablet by mouth daily. 03/02/18   [provider]  losartan (COZAAR) 50 MG tablet Take by mouth. 03/02/18   [provider]  mupirocin ointment (BACTROBAN) 2 % Apply 1 application topically daily. 05/12/21   Janalyn Harder, MD   potassium chloride (KLOR-CON) 10 MEQ tablet Take by mouth. 03/02/18   [provider]  rosuvastatin (CRESTOR) 10 MG tablet Take by mouth. 03/02/18   [provider]      Allergies    Patient has no known allergies.    Review of Systems   Review of Systems  Physical Exam Updated Vital Signs BP (!) 124/55   Pulse (!) 54   Temp 97.9 F (36.6 C)   Resp (!) 26   Wt 113.4 kg   SpO2 98%  Physical Exam Vitals and nursing note reviewed.  Constitutional:      General: She is not in acute distress.    Appearance: She is well-developed.     Comments: Appears dyspneic placed on 2 L nasal cannula due to hypoxia  HENT:     Head: Normocephalic and atraumatic.     Right Ear: External ear normal.     Left Ear: External ear normal.     Nose: Nose normal.  Eyes:     General: Scleral icterus present.     Extraocular Movements: Extraocular movements intact.     Pupils: Pupils are equal, round, and reactive to light.  Cardiovascular:     Rate and Rhythm: Normal rate and regular rhythm.     Heart sounds: No murmur heard. Pulmonary:     Effort: No respiratory distress.     Breath sounds: Normal breath sounds.     Comments: Somewhat limited due to habitus Abdominal:  General: Abdomen is flat. There is no distension.     Palpations: Abdomen is soft. There is no mass.     Tenderness: There is no abdominal tenderness. There is no guarding.  Musculoskeletal:     Cervical back: Normal range of motion and neck supple.     Right lower leg: Edema present.     Left lower leg: Edema present.     Comments: Anasarca  Skin:    General: Skin is warm and dry.     Coloration: Skin is jaundiced.  Neurological:     Mental Status: She is alert and oriented to person, place, and time. Mental status is at baseline.  Psychiatric:        Mood and Affect: Mood normal.     ED Results / Procedures / Treatments   Labs (all labs ordered are listed, but only abnormal results are  displayed) Labs Reviewed  BASIC METABOLIC PANEL - Abnormal; Notable for the following components:      Result Value   Glucose, Bld 131 (*)    BUN 28 (*)    Calcium 11.5 (*)    All other components within normal limits  CBC - Abnormal; Notable for the following components:   Hemoglobin 15.9 (*)    HCT 49.2 (*)    MCV 100.8 (*)    RDW 18.4 (*)    Platelets 111 (*)    All other components within normal limits  BRAIN NATRIURETIC PEPTIDE - Abnormal; Notable for the following components:   B Natriuretic Peptide 1,771.2 (*)    All other components within normal limits  HEPATIC FUNCTION PANEL - Abnormal; Notable for the following components:   AST 43 (*)    Total Bilirubin 5.8 (*)    Bilirubin, Direct 1.4 (*)    Indirect Bilirubin 4.4 (*)    All other components within normal limits  PROTIME-INR - Abnormal; Notable for the following components:   Prothrombin Time 16.2 (*)    INR 1.3 (*)    All other components within normal limits  I-STAT VENOUS BLOOD GAS, ED - Abnormal; Notable for the following components:   pO2, Ven 27 (*)    Bicarbonate 29.0 (*)    Sodium 146 (*)    Potassium 3.1 (*)    Hemoglobin 15.3 (*)    All other components within normal limits  TROPONIN I (HIGH SENSITIVITY) - Abnormal; Notable for the following components:   Troponin I (High Sensitivity) 22 (*)    All other components within normal limits  TROPONIN I (HIGH SENSITIVITY) - Abnormal; Notable for the following components:   Troponin I (High Sensitivity) 27 (*)    All other components within normal limits  SARS CORONAVIRUS 2 BY RT PCR  AMMONIA  ACETAMINOPHEN LEVEL    EKG EKG Interpretation Date/Time:  Wednesday June 21 2023 11:47:12 EDT Ventricular Rate:  62 PR Interval:    QRS Duration:  166 QT Interval:  474 QTC Calculation: 481 R Axis:   98  Text Interpretation: sinus rhythm with premature atrial contraction Right bundle branch block T wave abnormality, consider inferolateral ischemia  Abnormal ECG No previous ECGs available Confirmed by Vonita Moss [phone]) on 06/21/2023 12:28:54 PM  Radiology DG Chest Port 1 View  Result Date: 06/21/2023 CLINICAL DATA:  Provided history: Shortness of breath. EXAM: PORTABLE CHEST 1 VIEW COMPARISON:  Chest radiograph 09/27/2009. FINDINGS: Cardiomegaly. Aortic atherosclerosis. Central pulmonary vascular congestion. Prominence of the interstitial lung markings consistent with interstitial edema. Ill-defined opacity within the right lung  base. Trace right pleural effusion. No appreciable airspace consolidation on left. No evidence of pneumothorax. No acute osseous abnormality identified. Spondylosis and dextrocurvature of the thoracic spine. Degenerative changes of the left glenohumeral and bilateral acromioclavicular joints. IMPRESSION: 1. Cardiomegaly with central pulmonary vascular congestion and interstitial edema. 2. Ill-defined opacity within the right lung base, which may reflect atelectasis and/or airspace consolidation. 3. Trace right pleural effusion. 4. Aortic Atherosclerosis (ICD10-I70.0). Electronically Signed   By: Jackey Loge D.O.   On: 06/21/2023 13:47    Procedures Procedures    Medications Ordered in ED Medications  furosemide (LASIX) injection 40 mg (40 mg Intravenous Given 06/21/23 1253)    ED Course/ Medical Decision Making/ A&P Clinical Course as of 06/21/23 1625  Wed Jun 21, 2023  1445 Dr Irene Limbo to admit [RP]    Clinical Course User Index [RP] Rondel Baton, MD                                 Medical Decision Making Amount and/or Complexity of Data Reviewed Labs: ordered. Radiology: ordered.  Risk Prescription drug management. Decision regarding hospitalization.   Carrie Guzman is a 77 y.o. female with comorbidities that complicate the patient evaluation including BMI, hyperlipidemia, hypertension, and lower extremity swelling on Lasix who presents to the emergency department with shortness of  breath.   Initial Ddx:  Heart failure exacerbation, liver failure, volume overload, hypoxia, COPD exacerbation, URI, pneumonia  MDM/Course:  Patient presented to the emergency department with lower extremity swelling, dry cough, and shortness of breath.  Appears grossly volume overloaded on exam and has a new 2 L oxygen requirement.  Labs returned and showed that she had an elevated BNP and pulmonary edema with bilateral pleural effusions.  Was given 40 mg of Lasix and had approximately 400 mL of urine output while in the emergency department.  Given her oxygen requirement we will admit her.  Did become sleepy while in the emergency department so she had a VBG sent along with pneumonia after her bilirubin was found to be elevated.  These were unremarkable.  Unclear exactly what is causing her elevated bilirubin since her other LFTs do not appear to be elevated at this time.  May represent undiagnosed malignancy.  Patient does not drink any alcohol but does use Tylenol.  Tylenol level was sent and was 16.  Patient was notified of these findings and will require additional workup.  Upon re-evaluation remained stable on 2 L.  He was admitted to medicine for further management.  This patient presents to the ED for concern of complaints listed in HPI, this involves an extensive number of treatment options, and is a complaint that carries with it a high risk of complications and morbidity. Disposition including potential need for admission considered.   Dispo: Admit to Floor  Additional history obtained from spouse Records reviewed Outpatient Clinic Notes The following labs were independently interpreted: Chemistry and show  elevated bilirubin I independently reviewed the following imaging with scope of interpretation limited to determining acute life threatening conditions related to emergency care: Chest x-ray and agree with the radiologist interpretation with the following exceptions: none I personally  reviewed and interpreted cardiac monitoring: normal sinus rhythm  I personally reviewed and interpreted the pt's EKG: see above for interpretation  I have reviewed the patients home medications and made adjustments as needed Consults: Hospitalist Social Determinants of health:  Elderly  Final Clinical Impression(s) / ED Diagnoses Final diagnoses:  Hypervolemia, unspecified hypervolemia type  Acute pulmonary edema (HCC)  Hypoxia  Jaundice    Rx / DC Orders ED Discharge Orders     None         Rondel Baton, MD 06/21/23 1624    Rondel Baton, MD 06/21/23 [phone]

## 2023-06-21 NOTE — Progress Notes (Signed)
Plan of Care Note for accepted transfer   Patient: Carrie Guzman MRN: [id number]   DOA: 06/21/2023  Facility requesting transfer: [facility] Requesting Provider: Dr. Eloise Harman Reason for transfer: CHF Facility course:   76yow woman presented w/ anasarca, shortness of breath.  Workup suggested acute CHF which would be a new diagnosis.  Treated with Lasix.  Screening laboratory studies notable for hyperbilirubinemia of unclear etiology.  Elevated BNP 1771, minimal troponin elevation likely related to CHF.  Chest x-ray suggestive of CHF.  EKG sinus rhythm with right bundle branch block.  No old for comparison.  Plan of care: The patient is accepted for admission to Telemetry unit, at [facility]..   Remains under EDP care until arrival to [facility]  Author: Brendia Sacks, MD 06/21/2023  Check [email or web address] for on-call coverage.  Nursing staff, Please call [facility] Admits & Consults System-Wide number on Amion as soon as patient's arrival, so appropriate admitting provider can evaluate the pt.

## 2023-06-21 NOTE — H&P (Addendum)
History and Physical  Carrie Guzman LKG:[id number] DOB: 1946-01-02 DOA: 06/21/2023  PCP: Melida Quitter, MD   Chief Complaint: Worsening leg swelling, short of breath, dry cough  HPI: Carrie Guzman is a 77 y.o. female with medical history significant for lymphedema, hypertension, obesity admitted to the hospital with concerns for acute heart failure.  Patient states that she has had a nonproductive cough, lethargy, shortness of breath with exertion, and orthopnea for the last 2 to 3 weeks.  Does chronically take 40 mg of Lasix daily due to her lower extremity edema.  States that edema has been stable, has not noticed any worsening leg swelling.  Adamantly denies any chest pain, nausea.  Has never had shortness of breath like this before.  Days ago she went to [facility] they told her that she had an enlarged heart, some fluid on her lungs and that she needs to follow-up with cardiology.  ED Course: Today [facility], she was afebrile, heart rate in the 50s, saturating 86% on room air.  She was placed on 2 L nasal cannula oxygen, given a dose of 40 mg IV Lasix.  Lab work in the ER including CMP significant for total bilirubin 5.8, indirect bilirubin 4.4, BNP 1771, troponin 22, 27.  She was accepted for admission to the telemetry unit at [facility] for suspected heart failure and further workup.  Currently, she states that she did urinate quite a bit of receiving IV Lasix and feels like maybe she is breathing a little bit easier.  Review of Systems: Please see HPI for pertinent positives and negatives. A complete 10 system review of systems are otherwise negative.  Past Medical History:  Diagnosis Date   Elevated cholesterol    Hypertension    Squamous cell carcinoma of skin 05/12/2021   Left Lower Leg - anterior (in situ) (curet and 5FU)   History reviewed. No pertinent surgical history.  Social History:  reports that she has never smoked. She has never been  exposed to tobacco smoke. She has never used smokeless tobacco. She reports that she does not drink alcohol and does not use drugs.   No Known Allergies  History reviewed. No pertinent family history.   Prior to Admission medications   Medication Sig Start Date End Date Taking? Authorizing Provider  atenolol (TENORMIN) 50 MG tablet Take by mouth. 03/02/18   [provider]  calcipotriene (DOVONOX) 0.005 % cream Apply topically daily. 08/05/21   Janalyn Harder, MD  Clobetasol Prop Emollient Base (CLOBETASOL PROPIONATE E) 0.05 % emollient cream Apply to affected area qd- not for face, folds or groin 10/04/21   Janalyn Harder, MD  Clobetasol Propionate 0.05 % lotion Apply 1 application topically daily. 05/12/21   Janalyn Harder, MD  clotrimazole-betamethasone (LOTRISONE) lotion Apply topically. 03/02/18   [provider]  furosemide (LASIX) 40 MG tablet Take 1 tablet by mouth daily. 03/02/18   [provider]  losartan (COZAAR) 50 MG tablet Take by mouth. 03/02/18   [provider]  mupirocin ointment (BACTROBAN) 2 % Apply 1 application topically daily. 05/12/21   Janalyn Harder, MD  potassium chloride (KLOR-CON) 10 MEQ tablet Take by mouth. 03/02/18   [provider]  rosuvastatin (CRESTOR) 10 MG tablet Take by mouth. 03/02/18   [provider]    Physical Exam: BP (!) 148/64 (BP Location: Left Arm)   Pulse (!) 54   Temp 97.6 F (36.4 C) (Axillary)   Resp (!) 22   Wt 113.4  kg   SpO2 96%   General:  Alert, oriented, calm, in no acute distress wearing 2 L nasal cannula oxygen.  Daughter and husband are at the bedside with her. Eyes: EOMI, clear conjuctivae, white sclerea Neck: supple, no masses, trachea mildline  Cardiovascular: RRR, no murmurs or rubs, she has nonpitting nontender bilateral lower extremity edema with chronic appearing erythema Respiratory: clear to auscultation bilaterally, no obvious crackles but diminished at the right base,  no wheezes Abdomen: soft, nontender, nondistended, normal bowel tones heard  Skin: dry, no rashes  Musculoskeletal: no joint effusions, normal range of motion  Psychiatric: appropriate affect, normal speech  Neurologic: extraocular muscles intact, clear speech, moving all extremities with intact sensorium          Labs on Admission:  Basic Metabolic Panel: Recent Labs  Lab 06/21/23 1153 06/21/23 1332  NA 144 146*  K 4.3 3.1*  CL 109  --   CO2 22  --   GLUCOSE 131*  --   BUN 28*  --   CREATININE 0.83  --   CALCIUM 11.5*  --    Liver Function Tests: Recent Labs  Lab 06/21/23 1153  AST 43*  ALT 14  ALKPHOS 77  BILITOT 5.8*  PROT 7.5  ALBUMIN 4.0   No results for input(s): "LIPASE", "AMYLASE" in the last 168 hours. Recent Labs  Lab 06/21/23 1331  AMMONIA 24   CBC: Recent Labs  Lab 06/21/23 1153 06/21/23 1332  WBC 7.6  --   HGB 15.9* 15.3*  HCT 49.2* 45.0  MCV 100.8*  --   PLT 111*  --    Cardiac Enzymes: No results for input(s): "CKTOTAL", "CKMB", "CKMBINDEX", "TROPONINI" in the last 168 hours.  BNP (last 3 results) Recent Labs    06/21/23 1203  BNP 1,771.2*    ProBNP (last 3 results) No results for input(s): "PROBNP" in the last 8760 hours.  CBG: No results for input(s): "GLUCAP" in the last 168 hours.  Radiological Exams on Admission: DG Chest Port 1 View  Result Date: 06/21/2023 CLINICAL DATA:  Provided history: Shortness of breath. EXAM: PORTABLE CHEST 1 VIEW COMPARISON:  Chest radiograph 09/27/2009. FINDINGS: Cardiomegaly. Aortic atherosclerosis. Central pulmonary vascular congestion. Prominence of the interstitial lung markings consistent with interstitial edema. Ill-defined opacity within the right lung base. Trace right pleural effusion. No appreciable airspace consolidation on left. No evidence of pneumothorax. No acute osseous abnormality identified. Spondylosis and dextrocurvature of the thoracic spine. Degenerative changes of the left  glenohumeral and bilateral acromioclavicular joints. IMPRESSION: 1. Cardiomegaly with central pulmonary vascular congestion and interstitial edema. 2. Ill-defined opacity within the right lung base, which may reflect atelectasis and/or airspace consolidation. 3. Trace right pleural effusion. 4. Aortic Atherosclerosis (ICD10-I70.0). Electronically Signed   By: Jackey Loge D.O.   On: 06/21/2023 13:47    Assessment/Plan This is a pleasant 77 year old female with a history of obesity, lymphedema, hypertension being admitted to the hospital with several weeks of progressive dyspnea, orthopnea, nonproductive cough being admitted to the hospital with suspected acute systolic congestive heart failure.  Suspected acute systolic congestive heart failure-elevated BNP, orthopnea, pulmonary edema, chest x-ray with possible cardiomegaly -Inpatient admission with telemetry -Heart healthy diet -Continue Lasix milligram IV twice daily with scheduled potassium -2D echo -Beta-blocker (though may have to hold due to bradycardia), ARB, statin -Consider inpatient cardiology consultation in the morning, pending the above  Abnormal troponin-minimal elevation, trend is flat, likely due to CHF  Lymphedema-this appears to be chronic  Hypokalemia-due to Lasix,  will replete with 80 meq PO now, recheck in the morning  Hyperlipidemia-continue home Crestor  Acute hypoxic respiratory failure-likely due to pulmonary edema from congestive heart failure -Heart failure as above -Continue supplemental oxygen, wean as tolerated  Hypertension-continue losartan  Morbid obesity-BMI 42.9, complicating all aspects of care  DVT prophylaxis: Lovenox     Code Status: Full Code  Consults called: None  Admission status: The appropriate patient status for this patient is INPATIENT. Inpatient status is judged to be reasonable and necessary in order to provide the required intensity of service to ensure the patient's safety. The  patient's presenting symptoms, physical exam findings, and initial radiographic and laboratory data in the context of their chronic comorbidities is felt to place them at high risk for further clinical deterioration. Furthermore, it is not anticipated that the patient will be medically stable for discharge from the hospital within 2 midnights of admission.    I certify that at the point of admission it is my clinical judgment that the patient will require inpatient hospital care spanning beyond 2 midnights from the point of admission due to high intensity of service, high risk for further deterioration and high frequency of surveillance required  Time spent: 53 minutes  Carrie Guzman Sharlette Dense MD [facility] Pager [phone]  If 7PM-7AM, please contact night-coverage [email or web address] Password [email or web address]  06/21/2023, 5:13 PM

## 2023-06-21 NOTE — Progress Notes (Signed)
RT evaluated the Pt and her lungs were clear and diminished. Pt's feet and legs are very swollen. Pt was placed on 2l due to 88-92% SATS

## 2023-06-22 ENCOUNTER — Inpatient Hospital Stay (HOSPITAL_COMMUNITY): Payer: Medicare Other

## 2023-06-22 ENCOUNTER — Telehealth: Payer: Self-pay | Admitting: Acute Care

## 2023-06-22 ENCOUNTER — Ambulatory Visit: Payer: Medicare Other | Admitting: Cardiology

## 2023-06-22 DIAGNOSIS — I3139 Other pericardial effusion (noninflammatory): Secondary | ICD-10-CM

## 2023-06-22 DIAGNOSIS — I890 Lymphedema, not elsewhere classified: Secondary | ICD-10-CM

## 2023-06-22 DIAGNOSIS — I10 Essential (primary) hypertension: Secondary | ICD-10-CM

## 2023-06-22 DIAGNOSIS — I509 Heart failure, unspecified: Secondary | ICD-10-CM | POA: Diagnosis not present

## 2023-06-22 DIAGNOSIS — R0602 Shortness of breath: Secondary | ICD-10-CM

## 2023-06-22 DIAGNOSIS — I2609 Other pulmonary embolism with acute cor pulmonale: Secondary | ICD-10-CM | POA: Diagnosis not present

## 2023-06-22 DIAGNOSIS — I5021 Acute systolic (congestive) heart failure: Secondary | ICD-10-CM | POA: Diagnosis not present

## 2023-06-22 DIAGNOSIS — I5031 Acute diastolic (congestive) heart failure: Secondary | ICD-10-CM | POA: Diagnosis not present

## 2023-06-22 LAB — ECHOCARDIOGRAM COMPLETE
AR max vel: 2.99 cm2
AV Area VTI: 2.79 cm2
AV Area mean vel: 2.97 cm2
AV Mean grad: 7 mmHg
AV Peak grad: 13.2 mmHg
Ao pk vel: 1.82 m/s
Area-P 1/2: 2.5 cm2
Calc EF: 72.6 %
Height: 64 in
S' Lateral: 2.8 cm
Single Plane A2C EF: 78.8 %
Single Plane A4C EF: 68.1 %
Weight: 4366.87 [oz_av]

## 2023-06-22 LAB — BASIC METABOLIC PANEL
Anion gap: 6 (ref 5–15)
BUN: 28 mg/dL — ABNORMAL HIGH (ref 8–23)
CO2: 29 mmol/L (ref 22–32)
Calcium: 10.2 mg/dL (ref 8.9–10.3)
Chloride: 109 mmol/L (ref 98–111)
Creatinine, Ser: 0.8 mg/dL (ref 0.44–1.00)
GFR, Estimated: >60 mL/min (ref >60)
Glucose, Bld: 92 mg/dL (ref 70–99)
Potassium: 3.6 mmol/L (ref 3.5–5.1)
Sodium: 144 mmol/L (ref 135–145)

## 2023-06-22 LAB — GLUCOSE, CAPILLARY: Glucose-Capillary: 139 mg/dL — ABNORMAL HIGH (ref 70–99)

## 2023-06-22 LAB — T4, FREE: Free T4: 1.57 ng/dL — ABNORMAL HIGH (ref 0.61–1.12)

## 2023-06-22 LAB — SEDIMENTATION RATE: Sed Rate: 0 mm/hr (ref 0–22)

## 2023-06-22 MED ORDER — IOHEXOL 350 MG/ML SOLN
75.0000 mL | Freq: Once | INTRAVENOUS | Status: AC | PRN
  Administered 2023-06-22: 75 mL via INTRAVENOUS

## 2023-06-22 MED ORDER — HYDRALAZINE HCL 20 MG/ML IJ SOLN: 10.0000 mg | INTRAMUSCULAR | Status: DC | PRN

## 2023-06-22 MED ORDER — HEPARIN (PORCINE) 25000 UT/250ML-% IV SOLN
1400.0000 [IU]/h | INTRAVENOUS | Status: DC
  Administered 2023-06-22: 1400 [IU]/h via INTRAVENOUS
  Filled 2023-06-22: qty 250

## 2023-06-22 MED ORDER — TRAZODONE HCL 50 MG PO TABS
50.0000 mg | ORAL_TABLET | Freq: Every evening | ORAL | Status: DC | PRN
  Administered 2023-06-27 – 2023-06-28 (×2): 50 mg via ORAL
  Filled 2023-06-22 (×2): qty 1

## 2023-06-22 MED ORDER — SENNOSIDES-DOCUSATE SODIUM 8.6-50 MG PO TABS
1.0000 | ORAL_TABLET | Freq: Every evening | ORAL | Status: DC | PRN
  Administered 2023-06-28 – 2023-07-01 (×2): 1 via ORAL
  Filled 2023-06-22 (×2): qty 1

## 2023-06-22 MED ORDER — CHLORHEXIDINE GLUCONATE CLOTH 2 % EX PADS
6.0000 | MEDICATED_PAD | Freq: Every day | CUTANEOUS | Status: DC
  Administered 2023-06-22 – 2023-06-26 (×5): 6 via TOPICAL

## 2023-06-22 MED ORDER — POTASSIUM CHLORIDE CRYS ER 20 MEQ PO TBCR
40.0000 meq | EXTENDED_RELEASE_TABLET | Freq: Once | ORAL | Status: AC
  Administered 2023-06-22: 40 meq via ORAL
  Filled 2023-06-22: qty 2

## 2023-06-22 MED ORDER — METOPROLOL TARTRATE 5 MG/5ML IV SOLN: 5.0000 mg | INTRAVENOUS | Status: DC | PRN

## 2023-06-22 MED ORDER — FUROSEMIDE 10 MG/ML IJ SOLN
40.0000 mg | Freq: Three times a day (TID) | INTRAMUSCULAR | Status: DC
  Administered 2023-06-22 (×2): 40 mg via INTRAVENOUS
  Filled 2023-06-22 (×2): qty 4

## 2023-06-22 MED ORDER — IPRATROPIUM-ALBUTEROL 0.5-2.5 (3) MG/3ML IN SOLN: 3.0000 mL | RESPIRATORY_TRACT | Status: DC | PRN

## 2023-06-22 MED ORDER — HEPARIN BOLUS VIA INFUSION
2500.0000 [IU] | Freq: Once | INTRAVENOUS | Status: AC
  Administered 2023-06-22: 2500 [IU] via INTRAVENOUS
  Filled 2023-06-22: qty 2500

## 2023-06-22 MED ORDER — ORAL CARE MOUTH RINSE: 15.0000 mL | OROMUCOSAL | Status: DC | PRN

## 2023-06-22 MED ORDER — GUAIFENESIN 100 MG/5ML PO LIQD: 5.0000 mL | ORAL | Status: DC | PRN

## 2023-06-22 NOTE — Progress Notes (Signed)
ANTICOAGULATION CONSULT NOTE - Initial Consult  Pharmacy Consult for Heparin  Indication: pulmonary embolus  No Known Allergies  Patient Measurements: Height: 5\' 4"  (162.6 cm) Weight: 123.8 kg (272 lb 14.9 oz) IBW/kg (Calculated) : 54.7 Heparin Dosing Weight: 82 kg  Vital Signs: Temp: 97.6 F (36.4 C) (08/15 2038) Temp Source: Oral (08/15 2038) BP: 92/47 (08/15 2038) Pulse Rate: 62 (08/15 1749)  Labs: Recent Labs    06/21/23 1153 06/21/23 1331 06/21/23 1332 06/22/23 0452  HGB 15.9*  --  15.3*  --   HCT 49.2*  --  45.0  --   PLT 111*  --   --   --   LABPROT 16.2*  --   --   --   INR 1.3*  --   --   --   CREATININE 0.83  --   --  0.80  TROPONINIHS 22* 27*  --   --     Estimated Creatinine Clearance: 78.9 mL/min (by C-G formula based on SCr of 0.8 mg/dL).   Medical History: Past Medical History:  Diagnosis Date   Elevated cholesterol    Hypertension    Squamous cell carcinoma of skin 05/12/2021   Left Lower Leg - anterior (in situ) (curet and 5FU)    Medications:  Scheduled:   aspirin EC  81 mg Oral Daily   carvedilol  6.25 mg Oral BID WC   Chlorhexidine Gluconate Cloth  6 each Topical Daily   enoxaparin (LOVENOX) injection  60 mg Subcutaneous Q24H   furosemide  40 mg Intravenous TID   losartan  50 mg Oral Daily   rosuvastatin  10 mg Oral Daily   Infusions:   Assessment: 47 yoF admitted on 8/14 with CHF exacerbation.  On 8/15 CT is positive for PE, pharmacy is consulted to dose heparin.  She has been on prophylactic Lovenox 0.5 mg/kg daily - most recent dose on 8/15 at 18:00 Baseline INR elevated at 1.3 (8/14) CBC: Hgb 15.3, Plt 111 (last on 06/21/23)  Goal of Therapy:  Heparin level 0.3-0.7 units/ml aPTT 66-102 seconds Monitor platelets by anticoagulation protocol: Yes   Plan:  Decreased heparin bolus d/t recent Lovenox, 2500 units IV x1 Start heparin IV infusion at 1400 units/hr Heparin level 8 hours after starting Daily heparin level and  CBC   Lynann Beaver PharmD, BCPS [facility] main pharmacy [phone] 06/22/2023 9:02 PM

## 2023-06-22 NOTE — Telephone Encounter (Signed)
Hi, Dr Wynona Neat! Anders Simmonds is wanting for this patient to have a hospital followup with you in the next 2-4 weeks. She has sleep apnea and pulm hypertension. Unfortunately, you are booked. Could we double book you for the patient to be seen?

## 2023-06-22 NOTE — Telephone Encounter (Signed)
Okay to double book so that patient can be seen soonest

## 2023-06-22 NOTE — Progress Notes (Addendum)
PROGRESS NOTE    Carrie Guzman  ZOX:[id number] DOB: 11/21/1945 DOA: 06/21/2023 PCP: Melida Quitter, MD   Brief Narrative:  77 year old with history of lymphedema, HTN, obesity admitted to the hospital with concerns of shortness of breath ongoing for 2-3 weeks.  Patient was diagnosed with CHF exacerbation.  She had elevated BNP, total bilirubin was elevated.  Upon admission started on diuretics.   Assessment & Plan:  Principal Problem:   Acute CHF (congestive heart failure) (HCC) Active Problems:   Acute systolic (congestive) heart failure (HCC)   Acute respiratory distress with mild hypoxia Acute congestive heart failure, unspecified.  Class III -No prior echocardiogram available, currently on IV Lasix - Echocardiogram - Fluid restriction, monitor and replete electrolytes as necessary - Out of bed to chair  Addendum Echo shows ef60% with elevated PASP, cor pulmonale, pericardiac effusion without tamponade.  Will consult cardiology, wonder if he will need RHC/med management.  Spoke with Pulm team who will help for outpatient follow up as well.    Abnormal troponin -Suspect mild demand ischemia.  No obvious evidence of ischemic changes noted.  Low TSH - Will check free T4  Elevated total bilirubin - Unclear etiology.  Possible hepatic congestion?  Will trend for now and obtain right upper quadrant ultrasound   Lymphedema -this appears to be chronic   Hypokalemia -Replete as needed   Hyperlipidemia -continue home Crestor    Hypertension -continue losartan and Lasix.  IV as needed   Morbid obesity-BMI 42.9, complicating all aspects of care    DVT prophylaxis: SCDs Start: 06/21/23 1713 Code Status: Full code Family Communication: Husband present at bedside Status is: Inpatient Remains inpatient appropriate because: Ongoing management for fluid overload and CHF exacerbation       Diet Orders (From admission, onward)     Start     Ordered   06/21/23 1709   Diet Heart Room service appropriate? Yes; Fluid consistency: Thin  Diet effective now       Question Answer Comment  Room service appropriate? Yes   Fluid consistency: Thin      06/21/23 1710            Subjective: Tells me her breathing is somewhat better this morning.  Does not have any other complaints at this time.   Examination:  General exam: Appears calm and comfortable, morbid obesity Respiratory system: Bilateral crackles at the bases Cardiovascular system: S1 & S2 heard, RRR. No JVD, murmurs, rubs, gallops or clicks. No pedal edema. Gastrointestinal system: Abdomen is nondistended, soft and nontender. No organomegaly or masses felt. Normal bowel sounds heard. Central nervous system: Alert and oriented. No focal neurological deficits. Extremities: Symmetric 5 x 5 power. Skin: No rashes, lesions or ulcers Psychiatry: Judgement and insight appear normal. Mood & affect appropriate.  Objective: Vitals:   06/21/23 2155 06/22/23 0135 06/22/23 0500 06/22/23 0633  BP: (!) 122/56 117/86  109/64  Pulse: (!) 47 (!) 50  (!) 52  Resp: 20 17  16   Temp: (!) 97.3 F (36.3 C) 97.8 F (36.6 C)  97.7 F (36.5 C)  TempSrc: Oral Oral  Oral  SpO2: 99% 97%  98%  Weight:   123.8 kg   Height:        Intake/Output Summary (Last 24 hours) at 06/22/2023 0804 Last data filed at 06/22/2023 0649 Gross per 24 hour  Intake 120 ml  Output 1300 ml  Net -1180 ml   Filed Weights   06/21/23 1143 06/21/23 1700 06/22/23 0500  Weight: 113.4 kg 113.4 kg 123.8 kg    Scheduled Meds:  aspirin EC  81 mg Oral Daily   carvedilol  6.25 mg Oral BID WC   enoxaparin (LOVENOX) injection  60 mg Subcutaneous Q24H   furosemide  40 mg Intravenous BID   losartan  50 mg Oral Daily   potassium chloride  40 mEq Oral Daily   rosuvastatin  10 mg Oral Daily   Continuous Infusions:  Nutritional status     Body mass index is 46.85 kg/m.  Data Reviewed:   CBC: Recent Labs  Lab 06/21/23 1153  06/21/23 1332  WBC 7.6  --   HGB 15.9* 15.3*  HCT 49.2* 45.0  MCV 100.8*  --   PLT 111*  --    Basic Metabolic Panel: Recent Labs  Lab 06/21/23 1153 06/21/23 1332 06/22/23 0452  NA 144 146* 144  K 4.3 3.1* 3.6  CL 109  --  109  CO2 22  --  29  GLUCOSE 131*  --  92  BUN 28*  --  28*  CREATININE 0.83  --  0.80  CALCIUM 11.5*  --  10.2   GFR: Estimated Creatinine Clearance: 78.9 mL/min (by C-G formula based on SCr of 0.8 mg/dL). Liver Function Tests: Recent Labs  Lab 06/21/23 1153  AST 43*  ALT 14  ALKPHOS 77  BILITOT 5.8*  PROT 7.5  ALBUMIN 4.0   No results for input(s): "LIPASE", "AMYLASE" in the last 168 hours. Recent Labs  Lab 06/21/23 1331  AMMONIA 24   Coagulation Profile: Recent Labs  Lab 06/21/23 1153  INR 1.3*   Cardiac Enzymes: No results for input(s): "CKTOTAL", "CKMB", "CKMBINDEX", "TROPONINI" in the last 168 hours. BNP (last 3 results) No results for input(s): "PROBNP" in the last 8760 hours. HbA1C: No results for input(s): "HGBA1C" in the last 72 hours. CBG: No results for input(s): "GLUCAP" in the last 168 hours. Lipid Profile: No results for input(s): "CHOL", "HDL", "LDLCALC", "TRIG", "CHOLHDL", "LDLDIRECT" in the last 72 hours. Thyroid Function Tests: Recent Labs    06/21/23 1852  TSH 0.252*   Anemia Panel: No results for input(s): "VITAMINB12", "FOLATE", "FERRITIN", "TIBC", "IRON", "RETICCTPCT" in the last 72 hours. Sepsis Labs: No results for input(s): "PROCALCITON", "LATICACIDVEN" in the last 168 hours.  Recent Results (from the past 240 hour(s))  SARS Coronavirus 2 by RT PCR (hospital order, performed in [facility] hospital lab) *cepheid single result test* Anterior Nasal Swab     Status: None   Collection Time: 06/21/23 12:07 PM   Specimen: Anterior Nasal Swab  Result Value Ref Range Status   SARS Coronavirus 2 by RT PCR NEGATIVE NEGATIVE Final    Comment: (NOTE) SARS-CoV-2 target nucleic acids are NOT DETECTED.  The  SARS-CoV-2 RNA is generally detectable in upper and lower respiratory specimens during the acute phase of infection. The lowest concentration of SARS-CoV-2 viral copies this assay can detect is 250 copies / mL. A negative result does not preclude SARS-CoV-2 infection and should not be used as the sole basis for treatment or other patient management decisions.  A negative result may occur with improper specimen collection / handling, submission of specimen other than nasopharyngeal swab, presence of viral mutation(s) within the areas targeted by this assay, and inadequate number of viral copies (<250 copies / mL). A negative result must be combined with clinical observations, patient history, and epidemiological information.  Fact Sheet for Patients:   RoadLapTop.co.za  Fact Sheet for Healthcare Providers: http://kim-miller.com/  This  test is not yet approved or  cleared by the [place] and has been authorized for detection and/or diagnosis of SARS-CoV-2 by FDA under an Emergency Use Authorization (EUA).  This EUA will remain in effect (meaning this test can be used) for the duration of the COVID-19 declaration under Section 564(b)(1) of the Act, 21 U.S.C. section 360bbb-3(b)(1), unless the authorization is terminated or revoked sooner.  Performed at [facility], [address], [place], [place] [address]          Radiology Studies: [facility] Chest [facility] 1 View  Result Date: 06/21/2023 CLINICAL DATA:  Provided history: Shortness of breath. EXAM: PORTABLE CHEST 1 VIEW COMPARISON:  Chest radiograph 09/27/2009. FINDINGS: Cardiomegaly. Aortic atherosclerosis. Central pulmonary vascular congestion. Prominence of the interstitial lung markings consistent with interstitial edema. Ill-defined opacity within the right lung base. Trace right pleural effusion. No appreciable airspace consolidation on left. No evidence of  pneumothorax. No acute osseous abnormality identified. Spondylosis and dextrocurvature of the thoracic spine. Degenerative changes of the left glenohumeral and bilateral acromioclavicular joints. IMPRESSION: 1. Cardiomegaly with central pulmonary vascular congestion and interstitial edema. 2. Ill-defined opacity within the right lung base, which may reflect atelectasis and/or airspace consolidation. 3. Trace right pleural effusion. 4. Aortic Atherosclerosis (ICD10-I70.0). Electronically Signed   By: Jackey Loge D.O.   On: 06/21/2023 13:47           LOS: 1 day   Time spent= 35 mins    Merwyn Hodapp Joline Maxcy, MD [facility]  If 7PM-7AM, please contact night-coverage  06/22/2023, 8:04 AM

## 2023-06-22 NOTE — Progress Notes (Signed)
Patient Name: Arilyn Mischler           DOB: [birth year]  MRN: [id number]      Admission Date: 06/21/2023  Attending Provider: Miguel Rota, MD  Primary Diagnosis: Acute CHF (congestive heart failure) (HCC)   Level of care: Telemetry Cardiac    CROSS COVER NOTE   Date of Service   06/22/2023   Lynnell Catalan, 77 y.o. female, was admitted on 06/21/2023 for Acute CHF (congestive heart failure) (HCC).    HPI/Events of Note   Notified by radiology (Dr. Ladona Ridgel) of chest CT angio result.   CT angio chest PE with or without contrast IMPRESSION: 1. RUL segmental and subsegmental PE with evidence of right heart strain. 2. Cardiomegaly with large pericardial effusion. 3. Moderate right pleural effusion. 4. Patchy airspace disease bilaterally with consolidation in the RLL, possible atelectasis or infiltrate. 5. Aortic atherosclerosis and coronary artery calcifications. 6. Complex hyperdense lesion in the mid left kidney.  7. Partial visualization of herniated small bowel in the right upper quadrant.   Interventions/ Plan   IV heparin started for PE with right heart strain-pharmacy consulted Patient updated regarding PE finding         Anthoney Harada, DNP, ACNPC- AG [facility] [place]

## 2023-06-22 NOTE — Telephone Encounter (Signed)
Scheduled for HFU on 8/28 with AO

## 2023-06-22 NOTE — TOC Initial Note (Addendum)
Transition of Care [facility]) - Initial/Assessment Note    Patient Details  Name: Carrie Guzman MRN: [id number] Date of Birth: 12-06-1945  Transition of Care [facility]) CM/SW Contact:    Lanier Clam, RN Phone Number: 06/22/2023, 10:28 AM  Clinical Narrative: d/c plan home. Await PT recc.Monitor for d/c needs.                  Expected Discharge Plan: Home/Self Care Barriers to Discharge: Continued Medical Work up   Patient Goals and CMS Choice Patient states their goals for this hospitalization and ongoing recovery are:: Home CMS Medicare.gov Compare Post Acute Care list provided to:: Patient Choice offered to / list presented to : Patient [place] ownership interest in [facility].provided to:: Patient    Expected Discharge Plan and Services   Discharge Planning Services: CM Consult   Living arrangements for the past 2 months: Single Family Home                                      Prior Living Arrangements/Services Living arrangements for the past 2 months: Single Family Home Lives with:: Spouse Patient language and need for interpreter reviewed:: Yes Do you feel safe going back to the place where you live?: Yes      Need for Family Participation in Patient Care: Yes (Comment) Care giver support system in place?: Yes (comment)   Criminal Activity/Legal Involvement Pertinent to Current Situation/Hospitalization: No - Comment as needed  Activities of Daily Living Home Assistive Devices/Equipment: Walker (specify type) ADL Screening (condition at time of admission) Patient's cognitive ability adequate to safely complete daily activities?: No Is the patient deaf or have difficulty hearing?: No Does the patient have difficulty seeing, even when wearing glasses/contacts?: No Does the patient have difficulty concentrating, remembering, or making decisions?: No Patient able to express need for assistance with ADLs?: No Does the patient have difficulty  dressing or bathing?: Yes Independently performs ADLs?: No Communication: Independent Dressing (OT): Dependent Is this a change from baseline?: Pre-admission baseline Grooming: Dependent Is this a change from baseline?: Pre-admission baseline Feeding: Independent Bathing: Dependent Is this a change from baseline?: Pre-admission baseline Toileting: Dependent Is this a change from baseline?: Pre-admission baseline In/Out Bed: Needs assistance Is this a change from baseline?: Pre-admission baseline Walks in Home: Needs assistance Is this a change from baseline?: Pre-admission baseline Does the patient have difficulty walking or climbing stairs?: Yes Weakness of Legs: Both Weakness of Arms/Hands: Both  Permission Sought/Granted Permission sought to share information with : Case Manager Permission granted to share information with : Yes, Verbal Permission Granted  Share Information with NAME: Case Manager           Emotional Assessment Appearance:: Appears stated age Attitude/Demeanor/Rapport: Gracious Affect (typically observed): Accepting Orientation: : Oriented to Self, Oriented to Place, Oriented to  Time, Oriented to Situation Alcohol / Substance Use: Not Applicable Psych Involvement: No (comment)  Admission diagnosis:  Acute pulmonary edema (HCC) [J81.0] Jaundice [R17] Hypoxia [R09.02] Acute CHF (congestive heart failure) (HCC) [I50.9] Hypervolemia, unspecified hypervolemia type [E87.70] Acute systolic (congestive) heart failure (HCC) [I50.21] Patient Active Problem List   Diagnosis Date Noted   Acute CHF (congestive heart failure) (HCC) 06/21/2023   Acute systolic (congestive) heart failure (HCC) 06/21/2023   PCP:  Melida Quitter, MD Pharmacy:   [facility] [phone] - [place], [place] - [address] AT [address] OF  [address] [facility] [address] [place] [address] Phone: [phone] Fax: [phone]     Social Determinants of Health  (SDOH) Social History: SDOH Screenings   Food Insecurity: No Food Insecurity (06/21/2023)  Housing: Low Risk  (06/21/2023)  Transportation Needs: No Transportation Needs (06/21/2023)  Utilities: Not At Risk (06/21/2023)  Tobacco Use: Low Risk  (06/21/2023)  Recent Concern: Tobacco Use - Medium Risk (06/07/2023)   Received from [facility]   SDOH Interventions:     Readmission Risk Interventions     No data to display

## 2023-06-22 NOTE — Consult Note (Signed)
Cardiology Consultation   Patient ID: Carrie Guzman MRN: [id number]; DOB: [birth year]  Admit date: 06/21/2023 Date of Consult: 06/22/2023  PCP:  Melida Quitter, MD   [facility] Providers Cardiologist:  None   -- New to Dr. Mayford Knife    Patient Profile:   Carrie Guzman is a 77 y.o. female with a hx of lymphedema, htn, obesity (BMI 46), HLD who is being seen 06/22/2023 for the evaluation of pulmonary HTN at the request of Dr. Nelson Chimes.  History of Present Illness:   Carrie Guzman is a 77 year old female with above medical history. Per chart review, patient does not appear to have any past cardiac history and does not follow with a cardiologist.   Patient presented to the ED at [facility] on 8/14 complaining of shortness of breath. At that time, patient reported that a few days prior, she had been diagnosed with "fluid on lungs and an enlarged heart" and was referred to cardiology. Had not yet been seen by a cardiologist. In the ED, initial vital signs showed oxygen saturation 86% on room air, BP 119/54, pulse 53 BPM. CXR showed cardiomegaly with central pulmonary vascular congestion and interstitial edema. BNP 1771. hsTn 22, 27. She was admitted to the hospitalist service for CHF and was started on IV lasix 40 mg BID. She underwent echocardiogram on 06/22/23 that showed EF 60-65%, no regional wall motion abnormalities, grade I DD, abnormal septal motion consistent with RV pressure overload. RV function was severely reduced and there was severely elevated pulmonary artery systolic pressure. There was a moderate pericardial effusion.   On interview, patient reports that about 2 weeks ago, she had a persistent post nasal drip. She did not have cough, shortness of breath at that time. She went to an urgent care at that time. Since then, she has been very fatigued and has had low energy. Has also noticed intermittent shortness of breath on exertion. She has chronic lymphedema and she cannot  really tell if her lower extremity edema has worsened. Also has abdominal distention. She went to her PCP for evaluation of her fatigue, and was told she had fluid in her lungs and an enlarged heart. She was referred to cardiology, but has not yet been seen. Her husband brought her to the ED due to her fatigue. She was given lasix in the ED and she reports significant urine output. She denies ever being tested for sleep apnea. She does not lay flat on her back.    Past Medical History:  Diagnosis Date   Elevated cholesterol    Hypertension    Squamous cell carcinoma of skin 05/12/2021   Left Lower Leg - anterior (in situ) (curet and 5FU)    History reviewed. No pertinent surgical history.   Home Medications:  Prior to Admission medications   Medication Sig Start Date End Date Taking? Authorizing Provider  acetaminophen (TYLENOL) 650 MG CR tablet Take 1,300 mg by mouth as needed for pain.   Yes [provider]  atenolol (TENORMIN) 50 MG tablet Take by mouth. 03/02/18  Yes [provider]  furosemide (LASIX) 40 MG tablet Take 1 tablet by mouth daily. 03/02/18  Yes [provider]  losartan (COZAAR) 50 MG tablet Take by mouth. 03/02/18  Yes [provider]  Menthol, Topical Analgesic, (BIOFREEZE EX) Apply 1 Application topically daily.   Yes [provider]  Multiple Vitamins-Minerals (CENTRUM SILVER 50+WOMEN) TABS Take 1 tablet by mouth daily.   Yes Provider, Historical,  Cardiology Consultation   Patient ID: Carrie Guzman MRN: [id number]; DOB: [birth year]  Admit date: 06/21/2023 Date of Consult: 06/22/2023  PCP:  Melida Quitter, MD   [facility] Providers Cardiologist:  None   -- New to Dr. Mayford Knife    Patient Profile:   Carrie Guzman is a 77 y.o. female with a hx of lymphedema, htn, obesity (BMI 46), HLD who is being seen 06/22/2023 for the evaluation of pulmonary HTN at the request of Dr. Nelson Chimes.  History of Present Illness:   Carrie Guzman is a 77 year old female with above medical history. Per chart review, patient does not appear to have any past cardiac history and does not follow with a cardiologist.   Patient presented to the ED at [facility] on 8/14 complaining of shortness of breath. At that time, patient reported that a few days prior, she had been diagnosed with "fluid on lungs and an enlarged heart" and was referred to cardiology. Had not yet been seen by a cardiologist. In the ED, initial vital signs showed oxygen saturation 86% on room air, BP 119/54, pulse 53 BPM. CXR showed cardiomegaly with central pulmonary vascular congestion and interstitial edema. BNP 1771. hsTn 22, 27. She was admitted to the hospitalist service for CHF and was started on IV lasix 40 mg BID. She underwent echocardiogram on 06/22/23 that showed EF 60-65%, no regional wall motion abnormalities, grade I DD, abnormal septal motion consistent with RV pressure overload. RV function was severely reduced and there was severely elevated pulmonary artery systolic pressure. There was a moderate pericardial effusion.   On interview, patient reports that about 2 weeks ago, she had a persistent post nasal drip. She did not have cough, shortness of breath at that time. She went to an urgent care at that time. Since then, she has been very fatigued and has had low energy. Has also noticed intermittent shortness of breath on exertion. She has chronic lymphedema and she cannot  really tell if her lower extremity edema has worsened. Also has abdominal distention. She went to her PCP for evaluation of her fatigue, and was told she had fluid in her lungs and an enlarged heart. She was referred to cardiology, but has not yet been seen. Her husband brought her to the ED due to her fatigue. She was given lasix in the ED and she reports significant urine output. She denies ever being tested for sleep apnea. She does not lay flat on her back.    Past Medical History:  Diagnosis Date   Elevated cholesterol    Hypertension    Squamous cell carcinoma of skin 05/12/2021   Left Lower Leg - anterior (in situ) (curet and 5FU)    History reviewed. No pertinent surgical history.   Home Medications:  Prior to Admission medications   Medication Sig Start Date End Date Taking? Authorizing Provider  acetaminophen (TYLENOL) 650 MG CR tablet Take 1,300 mg by mouth as needed for pain.   Yes [provider]  atenolol (TENORMIN) 50 MG tablet Take by mouth. 03/02/18  Yes [provider]  furosemide (LASIX) 40 MG tablet Take 1 tablet by mouth daily. 03/02/18  Yes [provider]  losartan (COZAAR) 50 MG tablet Take by mouth. 03/02/18  Yes [provider]  Menthol, Topical Analgesic, (BIOFREEZE EX) Apply 1 Application topically daily.   Yes [provider]  Multiple Vitamins-Minerals (CENTRUM SILVER 50+WOMEN) TABS Take 1 tablet by mouth daily.   Yes Provider, Historical,  1153  PROT 7.5  ALBUMIN 4.0  AST 43*  ALT 14  ALKPHOS 77  BILITOT 5.8*   Lipids No results for input(s): "CHOL", "TRIG", "HDL", "LABVLDL", "LDLCALC", "CHOLHDL" in the last 168 hours.  Hematology Recent Labs  Lab 06/21/23 1153 06/21/23 1332  WBC 7.6  --   RBC 4.88  --   HGB 15.9* 15.3*  HCT 49.2* 45.0  MCV 100.8*  --   MCH 32.6  --   MCHC 32.3  --   RDW 18.4*  --   PLT 111*  --    Thyroid  Recent Labs  Lab 06/21/23 1852  TSH 0.252*    BNP Recent Labs  Lab 06/21/23 1203  BNP 1,771.2*    DDimer No results for input(s): "DDIMER" in the last 168 hours.   Radiology/Studies:  ECHOCARDIOGRAM COMPLETE  Result Date: 06/22/2023    ECHOCARDIOGRAM REPORT   Patient Name:   IYSIS ABEGG Pavlak Date of Exam: 06/22/2023 Medical Rec #:  [id number]           Height:       64.0 in Accession #:    [id number]          Weight:       272.9 lb Date of Birth:  1946-08-30           BSA:          2.233 m Patient Age:    76 years            BP:           100/64 mmHg Patient Gender: F                   HR:           58 bpm. Exam Location:  Inpatient Procedure: 2D Echo, 3D Echo, Cardiac Doppler and Color Doppler Indications:    I50.40* Unspecified combined systolic (congestive) and diastolic                 (congestive) heart failure  History:        Patient has no prior history of Echocardiogram examinations.                 CHF, Signs/Symptoms:Edema, Shortness of Breath and Dyspnea; Risk                 Factors:Hypertension.  Sonographer:    Sheralyn Boatman RDCS Referring Phys: [id number] MIR [facility]  Sonographer Comments: Patient is obese and suboptimal subcostal window. Image acquisition challenging due to patient body habitus. IMPRESSIONS  1. Left ventricular ejection fraction, by estimation, is 60 to 65%. The left ventricle has normal function. The left ventricle has no regional wall motion abnormalities. Left ventricular  diastolic parameters are consistent with Grade I diastolic dysfunction (impaired relaxation). There is abnormal (paradoxical) septal motion, consistent with right ventricular volume overload and the interventricular septum is flattened in systole and diastole, consistent with right ventricular pressure and volume overload.  2. Right ventricular systolic function is severely reduced. The right ventricular size is severely enlarged. There is severely elevated pulmonary artery systolic pressure. The estimated right ventricular systolic pressure is 94.2 mmHg.  3. Left atrial size was moderately dilated.  4. Right atrial size was severely dilated.  5. Moderate pericardial effusion. The pericardial effusion is posterior to the left ventricle and localized near the right atrium. There is no evidence of cardiac tamponade.  6. The mitral valve is abnormal. Trivial mitral valve regurgitation. No evidence  Cardiology Consultation   Patient ID: Carrie Guzman MRN: [id number]; DOB: [birth year]  Admit date: 06/21/2023 Date of Consult: 06/22/2023  PCP:  Melida Quitter, MD   [facility] Providers Cardiologist:  None   -- New to Dr. Mayford Knife    Patient Profile:   Carrie Guzman is a 77 y.o. female with a hx of lymphedema, htn, obesity (BMI 46), HLD who is being seen 06/22/2023 for the evaluation of pulmonary HTN at the request of Dr. Nelson Chimes.  History of Present Illness:   Carrie Guzman is a 77 year old female with above medical history. Per chart review, patient does not appear to have any past cardiac history and does not follow with a cardiologist.   Patient presented to the ED at [facility] on 8/14 complaining of shortness of breath. At that time, patient reported that a few days prior, she had been diagnosed with "fluid on lungs and an enlarged heart" and was referred to cardiology. Had not yet been seen by a cardiologist. In the ED, initial vital signs showed oxygen saturation 86% on room air, BP 119/54, pulse 53 BPM. CXR showed cardiomegaly with central pulmonary vascular congestion and interstitial edema. BNP 1771. hsTn 22, 27. She was admitted to the hospitalist service for CHF and was started on IV lasix 40 mg BID. She underwent echocardiogram on 06/22/23 that showed EF 60-65%, no regional wall motion abnormalities, grade I DD, abnormal septal motion consistent with RV pressure overload. RV function was severely reduced and there was severely elevated pulmonary artery systolic pressure. There was a moderate pericardial effusion.   On interview, patient reports that about 2 weeks ago, she had a persistent post nasal drip. She did not have cough, shortness of breath at that time. She went to an urgent care at that time. Since then, she has been very fatigued and has had low energy. Has also noticed intermittent shortness of breath on exertion. She has chronic lymphedema and she cannot  really tell if her lower extremity edema has worsened. Also has abdominal distention. She went to her PCP for evaluation of her fatigue, and was told she had fluid in her lungs and an enlarged heart. She was referred to cardiology, but has not yet been seen. Her husband brought her to the ED due to her fatigue. She was given lasix in the ED and she reports significant urine output. She denies ever being tested for sleep apnea. She does not lay flat on her back.    Past Medical History:  Diagnosis Date   Elevated cholesterol    Hypertension    Squamous cell carcinoma of skin 05/12/2021   Left Lower Leg - anterior (in situ) (curet and 5FU)    History reviewed. No pertinent surgical history.   Home Medications:  Prior to Admission medications   Medication Sig Start Date End Date Taking? Authorizing Provider  acetaminophen (TYLENOL) 650 MG CR tablet Take 1,300 mg by mouth as needed for pain.   Yes [provider]  atenolol (TENORMIN) 50 MG tablet Take by mouth. 03/02/18  Yes [provider]  furosemide (LASIX) 40 MG tablet Take 1 tablet by mouth daily. 03/02/18  Yes [provider]  losartan (COZAAR) 50 MG tablet Take by mouth. 03/02/18  Yes [provider]  Menthol, Topical Analgesic, (BIOFREEZE EX) Apply 1 Application topically daily.   Yes [provider]  Multiple Vitamins-Minerals (CENTRUM SILVER 50+WOMEN) TABS Take 1 tablet by mouth daily.   Yes Provider, Historical,  Cardiology Consultation   Patient ID: Carrie Guzman MRN: [id number]; DOB: [birth year]  Admit date: 06/21/2023 Date of Consult: 06/22/2023  PCP:  Melida Quitter, MD   [facility] Providers Cardiologist:  None   -- New to Dr. Mayford Knife    Patient Profile:   Carrie Guzman is a 77 y.o. female with a hx of lymphedema, htn, obesity (BMI 46), HLD who is being seen 06/22/2023 for the evaluation of pulmonary HTN at the request of Dr. Nelson Chimes.  History of Present Illness:   Carrie Guzman is a 77 year old female with above medical history. Per chart review, patient does not appear to have any past cardiac history and does not follow with a cardiologist.   Patient presented to the ED at [facility] on 8/14 complaining of shortness of breath. At that time, patient reported that a few days prior, she had been diagnosed with "fluid on lungs and an enlarged heart" and was referred to cardiology. Had not yet been seen by a cardiologist. In the ED, initial vital signs showed oxygen saturation 86% on room air, BP 119/54, pulse 53 BPM. CXR showed cardiomegaly with central pulmonary vascular congestion and interstitial edema. BNP 1771. hsTn 22, 27. She was admitted to the hospitalist service for CHF and was started on IV lasix 40 mg BID. She underwent echocardiogram on 06/22/23 that showed EF 60-65%, no regional wall motion abnormalities, grade I DD, abnormal septal motion consistent with RV pressure overload. RV function was severely reduced and there was severely elevated pulmonary artery systolic pressure. There was a moderate pericardial effusion.   On interview, patient reports that about 2 weeks ago, she had a persistent post nasal drip. She did not have cough, shortness of breath at that time. She went to an urgent care at that time. Since then, she has been very fatigued and has had low energy. Has also noticed intermittent shortness of breath on exertion. She has chronic lymphedema and she cannot  really tell if her lower extremity edema has worsened. Also has abdominal distention. She went to her PCP for evaluation of her fatigue, and was told she had fluid in her lungs and an enlarged heart. She was referred to cardiology, but has not yet been seen. Her husband brought her to the ED due to her fatigue. She was given lasix in the ED and she reports significant urine output. She denies ever being tested for sleep apnea. She does not lay flat on her back.    Past Medical History:  Diagnosis Date   Elevated cholesterol    Hypertension    Squamous cell carcinoma of skin 05/12/2021   Left Lower Leg - anterior (in situ) (curet and 5FU)    History reviewed. No pertinent surgical history.   Home Medications:  Prior to Admission medications   Medication Sig Start Date End Date Taking? Authorizing Provider  acetaminophen (TYLENOL) 650 MG CR tablet Take 1,300 mg by mouth as needed for pain.   Yes [provider]  atenolol (TENORMIN) 50 MG tablet Take by mouth. 03/02/18  Yes [provider]  furosemide (LASIX) 40 MG tablet Take 1 tablet by mouth daily. 03/02/18  Yes [provider]  losartan (COZAAR) 50 MG tablet Take by mouth. 03/02/18  Yes [provider]  Menthol, Topical Analgesic, (BIOFREEZE EX) Apply 1 Application topically daily.   Yes [provider]  Multiple Vitamins-Minerals (CENTRUM SILVER 50+WOMEN) TABS Take 1 tablet by mouth daily.   Yes Provider, Historical,  1153  PROT 7.5  ALBUMIN 4.0  AST 43*  ALT 14  ALKPHOS 77  BILITOT 5.8*   Lipids No results for input(s): "CHOL", "TRIG", "HDL", "LABVLDL", "LDLCALC", "CHOLHDL" in the last 168 hours.  Hematology Recent Labs  Lab 06/21/23 1153 06/21/23 1332  WBC 7.6  --   RBC 4.88  --   HGB 15.9* 15.3*  HCT 49.2* 45.0  MCV 100.8*  --   MCH 32.6  --   MCHC 32.3  --   RDW 18.4*  --   PLT 111*  --    Thyroid  Recent Labs  Lab 06/21/23 1852  TSH 0.252*    BNP Recent Labs  Lab 06/21/23 1203  BNP 1,771.2*    DDimer No results for input(s): "DDIMER" in the last 168 hours.   Radiology/Studies:  ECHOCARDIOGRAM COMPLETE  Result Date: 06/22/2023    ECHOCARDIOGRAM REPORT   Patient Name:   IYSIS ABEGG Pavlak Date of Exam: 06/22/2023 Medical Rec #:  [id number]           Height:       64.0 in Accession #:    [id number]          Weight:       272.9 lb Date of Birth:  1946-08-30           BSA:          2.233 m Patient Age:    76 years            BP:           100/64 mmHg Patient Gender: F                   HR:           58 bpm. Exam Location:  Inpatient Procedure: 2D Echo, 3D Echo, Cardiac Doppler and Color Doppler Indications:    I50.40* Unspecified combined systolic (congestive) and diastolic                 (congestive) heart failure  History:        Patient has no prior history of Echocardiogram examinations.                 CHF, Signs/Symptoms:Edema, Shortness of Breath and Dyspnea; Risk                 Factors:Hypertension.  Sonographer:    Sheralyn Boatman RDCS Referring Phys: [id number] MIR M [facility]  Sonographer Comments: Patient is obese and suboptimal subcostal window. Image acquisition challenging due to patient body habitus. IMPRESSIONS  1. Left ventricular ejection fraction, by estimation, is 60 to 65%. The left ventricle has normal function. The left ventricle has no regional wall motion abnormalities. Left ventricular  diastolic parameters are consistent with Grade I diastolic dysfunction (impaired relaxation). There is abnormal (paradoxical) septal motion, consistent with right ventricular volume overload and the interventricular septum is flattened in systole and diastole, consistent with right ventricular pressure and volume overload.  2. Right ventricular systolic function is severely reduced. The right ventricular size is severely enlarged. There is severely elevated pulmonary artery systolic pressure. The estimated right ventricular systolic pressure is 94.2 mmHg.  3. Left atrial size was moderately dilated.  4. Right atrial size was severely dilated.  5. Moderate pericardial effusion. The pericardial effusion is posterior to the left ventricle and localized near the right atrium. There is no evidence of cardiac tamponade.  6. The mitral valve is abnormal. Trivial mitral valve regurgitation. No evidence  Cardiology Consultation   Patient ID: Carrie Guzman MRN: [id number]; DOB: [birth year]  Admit date: 06/21/2023 Date of Consult: 06/22/2023  PCP:  Melida Quitter, MD   [facility] Providers Cardiologist:  None   -- New to Dr. Mayford Knife    Patient Profile:   Carrie Guzman is a 77 y.o. female with a hx of lymphedema, htn, obesity (BMI 46), HLD who is being seen 06/22/2023 for the evaluation of pulmonary HTN at the request of Dr. Nelson Chimes.  History of Present Illness:   Carrie Guzman is a 77 year old female with above medical history. Per chart review, patient does not appear to have any past cardiac history and does not follow with a cardiologist.   Patient presented to the ED at [facility] on 8/14 complaining of shortness of breath. At that time, patient reported that a few days prior, she had been diagnosed with "fluid on lungs and an enlarged heart" and was referred to cardiology. Had not yet been seen by a cardiologist. In the ED, initial vital signs showed oxygen saturation 86% on room air, BP 119/54, pulse 53 BPM. CXR showed cardiomegaly with central pulmonary vascular congestion and interstitial edema. BNP 1771. hsTn 22, 27. She was admitted to the hospitalist service for CHF and was started on IV lasix 40 mg BID. She underwent echocardiogram on 06/22/23 that showed EF 60-65%, no regional wall motion abnormalities, grade I DD, abnormal septal motion consistent with RV pressure overload. RV function was severely reduced and there was severely elevated pulmonary artery systolic pressure. There was a moderate pericardial effusion.   On interview, patient reports that about 2 weeks ago, she had a persistent post nasal drip. She did not have cough, shortness of breath at that time. She went to an urgent care at that time. Since then, she has been very fatigued and has had low energy. Has also noticed intermittent shortness of breath on exertion. She has chronic lymphedema and she cannot  really tell if her lower extremity edema has worsened. Also has abdominal distention. She went to her PCP for evaluation of her fatigue, and was told she had fluid in her lungs and an enlarged heart. She was referred to cardiology, but has not yet been seen. Her husband brought her to the ED due to her fatigue. She was given lasix in the ED and she reports significant urine output. She denies ever being tested for sleep apnea. She does not lay flat on her back.    Past Medical History:  Diagnosis Date   Elevated cholesterol    Hypertension    Squamous cell carcinoma of skin 05/12/2021   Left Lower Leg - anterior (in situ) (curet and 5FU)    History reviewed. No pertinent surgical history.   Home Medications:  Prior to Admission medications   Medication Sig Start Date End Date Taking? Authorizing Provider  acetaminophen (TYLENOL) 650 MG CR tablet Take 1,300 mg by mouth as needed for pain.   Yes [provider]  atenolol (TENORMIN) 50 MG tablet Take by mouth. 03/02/18  Yes [provider]  furosemide (LASIX) 40 MG tablet Take 1 tablet by mouth daily. 03/02/18  Yes [provider]  losartan (COZAAR) 50 MG tablet Take by mouth. 03/02/18  Yes [provider]  Menthol, Topical Analgesic, (BIOFREEZE EX) Apply 1 Application topically daily.   Yes [provider]  Multiple Vitamins-Minerals (CENTRUM SILVER 50+WOMEN) TABS Take 1 tablet by mouth daily.   Yes Provider, Historical,

## 2023-06-22 NOTE — Progress Notes (Signed)
  Echocardiogram 2D Echocardiogram has been performed.  Janalyn Harder 06/22/2023, 11:09 AM

## 2023-06-22 NOTE — Progress Notes (Signed)
Transfer patient to [facility] for RHC tomorrow and CHF team eval per Cardiology recommendations.

## 2023-06-22 NOTE — Evaluation (Signed)
Physical Therapy Evaluation Patient Details Name: Carrie Guzman MRN: [id number] DOB: [birth year] Today's Date: 06/22/2023  History of Present Illness  77 year old with history of lymphedema, HTN, obesity admitted to the hospital with concerns of shortness of breath ongoing for 2-3 weeks.  Patient was diagnosed with CHF exacerbation and severe pulmonary hypertension.  Clinical Impression  Pt admitted with above diagnosis.  Pt currently with functional limitations due to the deficits listed below (see PT Problem List). Pt will benefit from acute skilled PT to increase their independence and safety with mobility to allow discharge.  Pt requiring increased time and effortful movement.  Pt assisted with standing at bedside and marching in place x2.  Pt typically uses cane and does not wear oxygen at baseline.  Pt utilized RW today for safety and improved stability as well as remained on 2L O2 [place] and SpO2 92% with activity.         If plan is discharge home, recommend the following: A little help with walking and/or transfers;A little help with bathing/dressing/bathroom;Assist for transportation;Help with stairs or ramp for entrance   Can travel by private vehicle        Equipment Recommendations Rolling walker (2 wheels)  Recommendations for Other Services       Functional Status Assessment Patient has had a recent decline in their functional status and demonstrates the ability to make significant improvements in function in a reasonable and predictable amount of time.     Precautions / Restrictions Precautions Precautions: Fall Precaution Comments: monitor sats      Mobility  Bed Mobility Overal bed mobility: Needs Assistance Bed Mobility: Supine to Sit, Sit to Supine     Supine to sit: Contact guard Sit to supine: Mod assist   General bed mobility comments: increased time and effort, assist for LEs onto bed    Transfers Overall transfer level: Needs  assistance Equipment used: Rolling walker (2 wheels) Transfers: Sit to/from Stand Sit to Stand: Min assist, From elevated surface           General transfer comment: verbal cues for hand placement, required a boost to rise from elevated bed, utilized wide BOS; performed marching in place x2, SpO2 92% on 2L O2 [place], moderate dyspnea    Ambulation/Gait                  Stairs            Wheelchair Mobility     Tilt Bed    Modified Rankin (Stroke Patients Only)       Balance Overall balance assessment: Mild deficits observed, not formally tested                                           Pertinent Vitals/Pain Pain Assessment Pain Assessment: No/denies pain    Home Living Family/patient expects to be discharged to:: Private residence Living Arrangements: Spouse/significant other   Type of Home: House Home Access: Stairs to enter   Secretary/administrator of Steps: 1   Home Layout: One level Home Equipment: Cane - single point      Prior Function Prior Level of Function : Independent/Modified Independent             Mobility Comments: ambulatory with cane       Extremity/Trunk Assessment        Lower Extremity Assessment Lower Extremity Assessment: Generalized weakness  Communication   Communication Communication: No apparent difficulties  Cognition Arousal: Alert Behavior During Therapy: WFL for tasks assessed/performed Overall Cognitive Status: Within Functional Limits for tasks assessed                                          General Comments      Exercises     Assessment/Plan    PT Assessment Patient needs continued PT services  PT Problem List Decreased strength;Cardiopulmonary status limiting activity;Decreased activity tolerance;Decreased mobility;Decreased balance;Decreased knowledge of use of DME;Obesity       PT Treatment Interventions DME instruction;Gait training;Balance  training;Functional mobility training;Therapeutic activities;Therapeutic exercise;Patient/family education    PT Goals (Current goals can be found in the Care Plan section)  Acute Rehab PT Goals PT Goal Formulation: With patient/family Time For Goal Achievement: 07/06/23 Potential to Achieve Goals: Good    Frequency Min 1X/week     Co-evaluation               AM-PAC PT "6 Clicks" Mobility  Outcome Measure Help needed turning from your back to your side while in a flat bed without using bedrails?: A Little Help needed moving from lying on your back to sitting on the side of a flat bed without using bedrails?: A Lot Help needed moving to and from a bed to a chair (including a wheelchair)?: A Lot Help needed standing up from a chair using your arms (e.g., wheelchair or bedside chair)?: A Lot Help needed to walk in hospital room?: A Little Help needed climbing 3-5 steps with a railing? : A Lot 6 Click Score: 14    End of Session Equipment Utilized During Treatment: Oxygen Activity Tolerance: Patient tolerated treatment well Patient left: in bed;with call bell/phone within reach;with family/visitor present Nurse Communication: Mobility status PT Visit Diagnosis: Difficulty in walking, not elsewhere classified (R26.2)    Time: 6962-9528 PT Time Calculation (min) (ACUTE ONLY): 27 min   Charges:   PT Evaluation $PT Eval Low Complexity: 1 Low PT Treatments $Therapeutic Activity: 8-22 mins PT General Charges $$ ACUTE PT VISIT: 1 Visit       Paulino Door, DPT Physical Therapist Acute Rehabilitation Services Office: [phone]   Janan Halter [place] 06/22/2023, 3:39 PM

## 2023-06-23 ENCOUNTER — Encounter (HOSPITAL_COMMUNITY)
Admission: EM | Disposition: A | Discharge status: Home / Self Care | Payer: Self-pay | Source: Home / Self Care | Attending: Internal Medicine

## 2023-06-23 ENCOUNTER — Inpatient Hospital Stay (HOSPITAL_COMMUNITY): Payer: Medicare Other

## 2023-06-23 ENCOUNTER — Ambulatory Visit (HOSPITAL_COMMUNITY): Admission: RE | Admit: 2023-06-23 | Payer: Medicare Other | Source: Home / Self Care | Admitting: Internal Medicine

## 2023-06-23 DIAGNOSIS — I2609 Other pulmonary embolism with acute cor pulmonale: Secondary | ICD-10-CM | POA: Diagnosis not present

## 2023-06-23 DIAGNOSIS — Z86711 Personal history of pulmonary embolism: Secondary | ICD-10-CM | POA: Diagnosis not present

## 2023-06-23 DIAGNOSIS — D696 Thrombocytopenia, unspecified: Secondary | ICD-10-CM

## 2023-06-23 DIAGNOSIS — I5031 Acute diastolic (congestive) heart failure: Secondary | ICD-10-CM

## 2023-06-23 DIAGNOSIS — I509 Heart failure, unspecified: Secondary | ICD-10-CM | POA: Diagnosis not present

## 2023-06-23 DIAGNOSIS — I10 Essential (primary) hypertension: Secondary | ICD-10-CM | POA: Diagnosis not present

## 2023-06-23 DIAGNOSIS — I272 Pulmonary hypertension, unspecified: Secondary | ICD-10-CM

## 2023-06-23 LAB — CBC
HCT: 44.9 % (ref 36.0–46.0)
Hemoglobin: 14.3 g/dL (ref 12.0–15.0)
MCH: 32.5 pg (ref 26.0–34.0)
MCHC: 31.8 g/dL (ref 30.0–36.0)
MCV: 102 fL — ABNORMAL HIGH (ref 80.0–100.0)
Platelets: 65 10*3/uL — ABNORMAL LOW (ref 150–400)
RBC: 4.4 MIL/uL (ref 3.87–5.11)
RDW: 17.9 % — ABNORMAL HIGH (ref 11.5–15.5)
WBC: 8.8 10*3/uL (ref 4.0–10.5)
nRBC: 0 % (ref 0.0–0.2)

## 2023-06-23 LAB — COMPREHENSIVE METABOLIC PANEL
ALT: 17 U/L (ref 0–44)
AST: 28 U/L (ref 15–41)
Albumin: 2.8 g/dL — ABNORMAL LOW (ref 3.5–5.0)
Alkaline Phosphatase: 76 U/L (ref 38–126)
Anion gap: 9 (ref 5–15)
BUN: 23 mg/dL (ref 8–23)
CO2: 26 mmol/L (ref 22–32)
Calcium: 9.9 mg/dL (ref 8.9–10.3)
Chloride: 106 mmol/L (ref 98–111)
Creatinine, Ser: 0.45 mg/dL (ref 0.44–1.00)
GFR, Estimated: >60 mL/min (ref >60)
Glucose, Bld: 99 mg/dL (ref 70–99)
Potassium: 4.1 mmol/L (ref 3.5–5.1)
Sodium: 141 mmol/L (ref 135–145)
Total Bilirubin: 3.3 mg/dL — ABNORMAL HIGH (ref 0.3–1.2)
Total Protein: 5.7 g/dL — ABNORMAL LOW (ref 6.5–8.1)

## 2023-06-23 LAB — IMMATURE PLATELET FRACTION: Immature Platelet Fraction: 2.6 % (ref 1.2–8.6)

## 2023-06-23 LAB — GLUCOSE, CAPILLARY
Glucose-Capillary: 98 mg/dL (ref 70–99)
Glucose-Capillary: 102 mg/dL — ABNORMAL HIGH (ref 70–99)
Glucose-Capillary: 98 mg/dL (ref 70–99)
Glucose-Capillary: 135 mg/dL — ABNORMAL HIGH (ref 70–99)

## 2023-06-23 LAB — ANTI-SCLERODERMA ANTIBODY: Scleroderma (Scl-70) (ENA) Antibody, IgG: <0.2 AI (ref 0.0–0.9)

## 2023-06-23 LAB — HEPARIN LEVEL (UNFRACTIONATED)
Heparin Unfractionated: 0.84 [IU]/mL — ABNORMAL HIGH (ref 0.30–0.70)
Heparin Unfractionated: 0.66 [IU]/mL (ref 0.30–0.70)

## 2023-06-23 LAB — ANA W/REFLEX IF POSITIVE: Anti Nuclear Antibody (ANA): NEGATIVE

## 2023-06-23 LAB — ANTI-DNA ANTIBODY, DOUBLE-STRANDED: ds DNA Ab: <1 [IU]/mL (ref 0–9)

## 2023-06-23 LAB — PHOSPHORUS: Phosphorus: 2.1 mg/dL — ABNORMAL LOW (ref 2.5–4.6)

## 2023-06-23 LAB — MAGNESIUM: Magnesium: 1.9 mg/dL (ref 1.7–2.4)

## 2023-06-23 SURGERY — RIGHT HEART CATH
Anesthesia: LOCAL

## 2023-06-23 MED ORDER — HEPARIN (PORCINE) 25000 UT/250ML-% IV SOLN
1300.0000 [IU]/h | INTRAVENOUS | Status: DC
  Administered 2023-06-23 – 2023-06-24 (×3): 1200 [IU]/h via INTRAVENOUS
  Filled 2023-06-23 (×3): qty 250

## 2023-06-23 MED ORDER — SODIUM CHLORIDE 0.9 % IV SOLN: INTRAVENOUS | Status: DC

## 2023-06-23 NOTE — Hospital Course (Addendum)
Brief Narrative:  77 year old with history of lymphedema, HTN, obesity admitted to the hospital with concerns of shortness of breath ongoing for 2-3 weeks.  Patient was diagnosed with CHF exacerbation.  She had elevated BNP, total bilirubin was elevated.  Upon admission started on diuretics.  Echocardiogram showed preserved EF with elevated PASP, cor pulmonale and pericardial effusion without tamponade.  CTA chest showed acute pulmonary embolism but right side heart strain, case discussed with pulmonary who recommended heparin otherwise no indication for thrombectomy at this time.  Initial plan was to transfer patient to [facility] for RHC but given acute pulmonary embolism, will monitor the patient.  Overall patient is responding well to IV diuretics but still remains somewhat hypoxic.  PT/OT recommended no further follow-up.  Due to somewhat persistent hypoxia, pulmonary team to evaluate for possible thoracentesis.  Patient is now on heparin drip in anticipation for any procedure.     Assessment & Plan:  Principal Problem:   Acute CHF (congestive heart failure) (HCC) Active Problems:   Acute systolic (congestive) heart failure (HCC)   Acute respiratory distress with mild hypoxia Acute congestive heart failure, preserved ejection fraction" pulmonology, EF 60% Pericardial effusion without tamponade -Patient certainly has right-sided heart failure and signs of peripheral volume overload.e.  Patient seen by cardiology, eventually will need RHC once euvolemic.  Repeat echo in 2 weeks.  Tolerating Lasix well, continue   Acute right upper lobe and subsegmental pulmonary embolism without cor pulmonale Moderate pleural effusion, left - Seen by pulmonary, no indication for thrombectomy at this time.  Lower extremity Dopplers negative for DVT.  Will transition back to heparin drip in anticipation for possible thoracentesis today  Thrombocytopenia - Drifted down but around 66K and stable.  Seen by  hematology.  No need to hold anticoagulation unless if she develops bleeding or platelets are below 30.  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  Will need to repeat in next 3-4 weeks   Elevated total bilirubin, improving - Total bilirubin slightly trending upwards again.  Will obtain right upper quadrant ultrasound   Lymphedema -this appears to be chronic   Hypokalemia -Replete as needed   Hyperlipidemia -continue home Crestor    Hypertension -IV prn and lasix   Morbid obesity -BMI 42.9, complicating all aspects of care    PT/OT-home health, face-to-face completed   DVT prophylaxis: Full dose anticoagulation Code Status: Full code Family Communication: Husband present at bedside Status is: Inpatient Remains inpatient appropriate because: Ongoing management for acute pulmonary embolism and CHF

## 2023-06-23 NOTE — Progress Notes (Signed)
Bilateral lower extremity venous study completed.   Preliminary results relayed to MD.  Please see CV Procedures for preliminary results.  Sajan Cheatwood, RVT  10:00 AM 06/23/23

## 2023-06-23 NOTE — Progress Notes (Signed)
ANTICOAGULATION CONSULT NOTE Pharmacy Consult for Heparin  Indication: pulmonary embolus  No Known Allergies  Patient Measurements: Height: 5\' 4"  (162.6 cm) Weight: 122.4 kg (269 lb 12.8 oz) IBW/kg (Calculated) : 54.7 Heparin Dosing Weight: 82 kg  Vital Signs: Temp: 97.9 F (36.6 C) (08/16 1340) Temp Source: Oral (08/16 1340) BP: 121/53 (08/16 1340) Pulse Rate: 61 (08/16 1340)  Labs: Recent Labs    06/21/23 1153 06/21/23 1331 06/21/23 1332 06/22/23 0452 06/23/23 0549 06/23/23 1541  HGB 15.9*  --  15.3*  --  14.3  --   HCT 49.2*  --  45.0  --  44.9  --   PLT 111*  --   --   --  65*  --   LABPROT 16.2*  --   --   --   --   --   INR 1.3*  --   --   --   --   --   HEPARINUNFRC  --   --   --   --  0.84* 0.66  CREATININE 0.83  --   --  0.80 0.45  --   TROPONINIHS 22* 27*  --   --   --   --     Estimated Creatinine Clearance: 78.5 mL/min (by C-G formula based on SCr of 0.45 mg/dL).   Medications:  Scheduled:   aspirin EC  81 mg Oral Daily   carvedilol  6.25 mg Oral BID WC   Chlorhexidine Gluconate Cloth  6 each Topical Daily   rosuvastatin  10 mg Oral Daily   Infusions:   [START ON 06/24/2023] sodium chloride     heparin 1,200 Units/hr (06/23/23 0836)    Assessment: 76 yoF admitted on 8/14 with CHF exacerbation. 8/15 CT is positive for PE with RH strain; pharmacy consulted to dose heparin. She has been on prophylactic Lovenox 0.5 mg/kg daily - most recent dose on 8/15 at 18:00   Today, 06/23/2023: Most recent heparin level now therapeutic on 1200 units/hr CBC: Hg stable WNL, pltc low on admission and now decreased by almost 50% just 24 hr later [facility] consulting HemOnc to evaluate thrombocytopenia SCr low but consistent with baseline No bleeding or infusion related issues reported by RN   Goal of Therapy:  Heparin level 0.3-0.7 Monitor platelets by anticoagulation protocol: Yes   Plan:  Per Hematologist, continue heparin for now; consider holding if Plt drop  <30k or bleeding occurs Avoid antiplatelet agents >> aspirin discontinued Continue heparin at 1200 units/hr Recheck confirmatory heparin level in 8 hr Daily CBC and heparin level F/U plans for RHC  Bernadene Person, PharmD, BCPS [phone] 06/23/2023, 4:21 PM

## 2023-06-23 NOTE — Progress Notes (Signed)
Pt heparin stopped on previous shift due to last lab reading; restarted at [phone]

## 2023-06-23 NOTE — Progress Notes (Signed)
1852  TSH 0.252*  FREET4 1.57*   Anemia Panel: No results for input(s): "VITAMINB12", "FOLATE", "FERRITIN", "TIBC", "IRON", "RETICCTPCT" in the last 72 hours. Sepsis Labs: No results for input(s): "PROCALCITON", "LATICACIDVEN" in the last 168 hours.  Recent Results (from the past 240 hour(s))  SARS Coronavirus 2 by RT PCR (hospital order, performed in [facility] hospital lab) *cepheid single result test* Anterior Nasal Swab     Status: None   Collection Time: 06/21/23 12:07 PM   Specimen: Anterior Nasal Swab  Result Value Ref Range Status   SARS Coronavirus 2 by RT PCR NEGATIVE NEGATIVE Final    Comment: (NOTE) SARS-CoV-2 target nucleic acids are NOT DETECTED.  The SARS-CoV-2 RNA is generally detectable in upper and lower respiratory specimens during the acute phase of infection. The lowest concentration of SARS-CoV-2 viral copies this assay can detect is 250 copies / mL. A negative result does not preclude SARS-CoV-2 infection and should not be used as the sole basis for treatment or other patient management decisions.  A negative result may occur with improper specimen collection / handling, submission of specimen other than nasopharyngeal swab, presence of viral mutation(s) within the areas targeted by this assay, and inadequate number of viral copies (<250 copies / mL).  A negative result must be combined with clinical observations, patient history, and epidemiological information.  Fact Sheet for Patients:   RoadLapTop.co.za  Fact Sheet for Healthcare Providers: http://kim-miller.com/  This test is not yet approved or  cleared by the [place] FDA and has been authorized for detection and/or diagnosis of SARS-CoV-2 by FDA under an Emergency Use Authorization (EUA).  This EUA will remain in effect (meaning this test can be used) for the duration of the COVID-19 declaration under Section 564(b)(1) of the Act, 21 U.S.C. section 360bbb-3(b)(1), unless the authorization is terminated or revoked sooner.  Performed at [facility], [address] [place], [place] [address]          Radiology Studies: VAS [place] LOWER EXTREMITY VENOUS (DVT)  Result Date: 06/23/2023  Lower Venous DVT Study Patient Name:  Carrie Guzman  Date of Exam:   06/23/2023 Medical Rec #: [id number]            Accession #:    [id number] Date of Birth: 18-Jan-1946            Patient Gender: F Patient Age:   31 years Exam Location:  [facility] Procedure:      VAS [place] LOWER EXTREMITY VENOUS (DVT) Referring Phys: Stephania Fragmin --------------------------------------------------------------------------------  Indications: Pulmonary embolism, and chronic leg swelling.  Limitations: Body habitus and poor ultrasound/tissue interface. Comparison Study: No previous study. Performing Technologist: McKayla Maag RVT, [place]  Examination Guidelines: A complete evaluation includes B-mode imaging, spectral Doppler, color Doppler, and power Doppler as needed of all accessible portions of each vessel. Bilateral testing is considered an integral part of a complete examination. Limited examinations for reoccurring indications may be performed as noted. The reflux portion of the exam is performed with the patient in reverse Trendelenburg.   +--------+---------------+---------+-----------+----------+--------------------+ RIGHT   CompressibilityPhasicitySpontaneityPropertiesThrombus Aging       +--------+---------------+---------+-----------+----------+--------------------+ CFV     Full           Yes      Yes                                       +--------+---------------+---------+-----------+----------+--------------------+  COMPLETE  Result Date: 06/22/2023    ECHOCARDIOGRAM REPORT   Patient Name:   Carrie Guzman Date of Exam: 06/22/2023 Medical Rec #:  [id number]           Height:       64.0 in Accession #:    [id number]          Weight:       272.9 lb Date of Birth:  1946/06/10           BSA:          2.233 m Patient Age:    76 years            BP:           100/64 mmHg Patient Gender: F                   HR:           58 bpm. Exam  Location:  Inpatient Procedure: 2D Echo, 3D Echo, Cardiac Doppler and Color Doppler Indications:    I50.40* Unspecified combined systolic (congestive) and diastolic                 (congestive) heart failure  History:        Patient has no prior history of Echocardiogram examinations.                 CHF, Signs/Symptoms:Edema, Shortness of Breath and Dyspnea; Risk                 Factors:Hypertension.  Sonographer:    Sheralyn Boatman RDCS Referring Phys: [id number] MIR M [facility]  Sonographer Comments: Patient is obese and suboptimal subcostal window. Image acquisition challenging due to patient body habitus. IMPRESSIONS  1. Left ventricular ejection fraction, by estimation, is 60 to 65%. The left ventricle has normal function. The left ventricle has no regional wall motion abnormalities. Left ventricular diastolic parameters are consistent with Grade I diastolic dysfunction (impaired relaxation). There is abnormal (paradoxical) septal motion, consistent with right ventricular volume overload and the interventricular septum is flattened in systole and diastole, consistent with right ventricular pressure and volume overload.  2. Right ventricular systolic function is severely reduced. The right ventricular size is severely enlarged. There is severely elevated pulmonary artery systolic pressure. The estimated right ventricular systolic pressure is 94.2 mmHg.  3. Left atrial size was moderately dilated.  4. Right atrial size was severely dilated.  5. Moderate pericardial effusion. The pericardial effusion is posterior to the left ventricle and localized near the right atrium. There is no evidence of cardiac tamponade.  6. The mitral valve is abnormal. Trivial mitral valve regurgitation. No evidence of mitral stenosis. 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  7. The aortic valve is tricuspid. There is moderate calcification of the aortic valve. Aortic valve regurgitation is not visualized. Aortic valve  sclerosis/calcification is present, without any evidence of aortic stenosis.  8. Severely dilated pulmonary artery.  9. The inferior vena cava is dilated in size with <50% respiratory variability, suggesting right atrial pressure of 15 mmHg. 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 Pulmonary artery pressures are severely elevated. There is a moderate pericardial effusion related to her PAH/RV failure without evidence of tamponade. FINDINGS  Left Ventricle: Left ventricular ejection fraction, by estimation, is 60 to 65%. The left ventricle has normal function. The left ventricle has no regional wall motion abnormalities. The left  PROGRESS NOTE    Carrie Guzman  ZOX:[id number] DOB: 02-23-1946 DOA: 06/21/2023 PCP: Melida Quitter, MD    Brief Narrative:  77 year old with history of lymphedema, HTN, obesity admitted to the hospital with concerns of shortness of breath ongoing for 2-3 weeks.  Patient was diagnosed with CHF exacerbation.  She had elevated BNP, total bilirubin was elevated.  Upon admission started on diuretics.  Echocardiogram showed preserved EF with elevated PASP, cor pulmonale and pericardial effusion without tamponade.  CTA chest showed acute pulmonary embolism but right side heart strain, case discussed with pulmonary who recommended heparin otherwise no indication for thrombectomy at this time.  Initial plan was to transfer patient to [facility] for RHC but given acute pulmonary embolism, will monitor the patient     Assessment & Plan:  Principal Problem:   Acute CHF (congestive heart failure) (HCC) Active Problems:   Acute systolic (congestive) heart failure (HCC)   Acute respiratory distress with mild hypoxia Acute congestive heart failure, preserved ejection fraction" pulmonology, EF 60% Pericardial effusion without tamponade -Patient certainly has right-sided heart failure and signs of peripheral volume overload but given high risk of hypotension holding off on diuretics at this time.  Does have elevated BNP and mildly elevated troponin. Seen By cardiology.  Eventually may require RHC but will hold off on this due to acute pulmonary embolism with cor pulmonale.   Acute right upper lobe and subsegmental pulmonary embolism without cor pulmonale Moderate pleural effusion, left - Discussed case with pulmonary, Dr Isaiah Serge.  Currently there is no indication for thrombectomy.  Continue to closely monitor the patient on heparin drip. - Lower extremity Dopplers -Due to worsening thrombocytopenia, I will also discuss case with hematology, Dr Candise Che notified.  -Will need outpatient sleep  study  Thrombocytopenia - Platelets are dropping, down to 65 today.  No obvious signs of bleeding.  Continue heparin drip but will closely monitor this. Dr Candise Che will see the patient.   Low TSH and elevated free T4 - Likely euthyroid sick syndrome.  Will need to repeat in next 3-4 weeks   Elevated total bilirubin, improving - Unclear etiology.  Mild hepatic congestion secondary to right-sided RV dysfunction   Lymphedema -this appears to be chronic   Hypokalemia -Replete as needed   Hyperlipidemia -continue home Crestor    Hypertension -c currently on Coreg.  IV as needed   Morbid obesity-BMI 42.9, complicating all aspects of care     DVT prophylaxis: Heparin drip Code Status: Full code Family Communication: Husband present at bedside Status is: Inpatient Remains inpatient appropriate because: Ongoing management for acute pulmonary embolism                 Diet Orders (From admission, onward)     Start     Ordered   06/24/23 0001  Diet NPO time specified Except for: Sips with Meds  Diet effective midnight       Question:  Except for  Answer:  Clearance Coots with Meds   06/23/23 8119   06/23/23 0834  Diet Heart Room service appropriate? Yes; Fluid consistency: Thin; Fluid restriction: 1800 mL Fluid  Diet effective now       Question Answer Comment  Room service appropriate? Yes   Fluid consistency: Thin   Fluid restriction: 1800 mL Fluid      06/23/23 0833            Subjective: Feels ok, no new complaints.  No chest pain and SOB at rest.  1852  TSH 0.252*  FREET4 1.57*   Anemia Panel: No results for input(s): "VITAMINB12", "FOLATE", "FERRITIN", "TIBC", "IRON", "RETICCTPCT" in the last 72 hours. Sepsis Labs: No results for input(s): "PROCALCITON", "LATICACIDVEN" in the last 168 hours.  Recent Results (from the past 240 hour(s))  SARS Coronavirus 2 by RT PCR (hospital order, performed in [facility] hospital lab) *cepheid single result test* Anterior Nasal Swab     Status: None   Collection Time: 06/21/23 12:07 PM   Specimen: Anterior Nasal Swab  Result Value Ref Range Status   SARS Coronavirus 2 by RT PCR NEGATIVE NEGATIVE Final    Comment: (NOTE) SARS-CoV-2 target nucleic acids are NOT DETECTED.  The SARS-CoV-2 RNA is generally detectable in upper and lower respiratory specimens during the acute phase of infection. The lowest concentration of SARS-CoV-2 viral copies this assay can detect is 250 copies / mL. A negative result does not preclude SARS-CoV-2 infection and should not be used as the sole basis for treatment or other patient management decisions.  A negative result may occur with improper specimen collection / handling, submission of specimen other than nasopharyngeal swab, presence of viral mutation(s) within the areas targeted by this assay, and inadequate number of viral copies (<250 copies / mL).  A negative result must be combined with clinical observations, patient history, and epidemiological information.  Fact Sheet for Patients:   RoadLapTop.co.za  Fact Sheet for Healthcare Providers: http://kim-miller.com/  This test is not yet approved or  cleared by the [place] FDA and has been authorized for detection and/or diagnosis of SARS-CoV-2 by FDA under an Emergency Use Authorization (EUA).  This EUA will remain in effect (meaning this test can be used) for the duration of the COVID-19 declaration under Section 564(b)(1) of the Act, 21 U.S.C. section 360bbb-3(b)(1), unless the authorization is terminated or revoked sooner.  Performed at [facility], [address] [place], [place] [address]          Radiology Studies: VAS [place] LOWER EXTREMITY VENOUS (DVT)  Result Date: 06/23/2023  Lower Venous DVT Study Patient Name:  Carrie Guzman  Date of Exam:   06/23/2023 Medical Rec #: [id number]            Accession #:    [id number] Date of Birth: 18-Jan-1946            Patient Gender: F Patient Age:   31 years Exam Location:  [facility] Procedure:      VAS [place] LOWER EXTREMITY VENOUS (DVT) Referring Phys: Stephania Fragmin --------------------------------------------------------------------------------  Indications: Pulmonary embolism, and chronic leg swelling.  Limitations: Body habitus and poor ultrasound/tissue interface. Comparison Study: No previous study. Performing Technologist: McKayla Maag RVT, [place]  Examination Guidelines: A complete evaluation includes B-mode imaging, spectral Doppler, color Doppler, and power Doppler as needed of all accessible portions of each vessel. Bilateral testing is considered an integral part of a complete examination. Limited examinations for reoccurring indications may be performed as noted. The reflux portion of the exam is performed with the patient in reverse Trendelenburg.   +--------+---------------+---------+-----------+----------+--------------------+ RIGHT   CompressibilityPhasicitySpontaneityPropertiesThrombus Aging       +--------+---------------+---------+-----------+----------+--------------------+ CFV     Full           Yes      Yes                                       +--------+---------------+---------+-----------+----------+--------------------+  COMPLETE  Result Date: 06/22/2023    ECHOCARDIOGRAM REPORT   Patient Name:   Carrie Guzman Date of Exam: 06/22/2023 Medical Rec #:  [id number]           Height:       64.0 in Accession #:    [id number]          Weight:       272.9 lb Date of Birth:  1946/06/10           BSA:          2.233 m Patient Age:    76 years            BP:           100/64 mmHg Patient Gender: F                   HR:           58 bpm. Exam  Location:  Inpatient Procedure: 2D Echo, 3D Echo, Cardiac Doppler and Color Doppler Indications:    I50.40* Unspecified combined systolic (congestive) and diastolic                 (congestive) heart failure  History:        Patient has no prior history of Echocardiogram examinations.                 CHF, Signs/Symptoms:Edema, Shortness of Breath and Dyspnea; Risk                 Factors:Hypertension.  Sonographer:    Sheralyn Boatman RDCS Referring Phys: [id number] MIR M [facility]  Sonographer Comments: Patient is obese and suboptimal subcostal window. Image acquisition challenging due to patient body habitus. IMPRESSIONS  1. Left ventricular ejection fraction, by estimation, is 60 to 65%. The left ventricle has normal function. The left ventricle has no regional wall motion abnormalities. Left ventricular diastolic parameters are consistent with Grade I diastolic dysfunction (impaired relaxation). There is abnormal (paradoxical) septal motion, consistent with right ventricular volume overload and the interventricular septum is flattened in systole and diastole, consistent with right ventricular pressure and volume overload.  2. Right ventricular systolic function is severely reduced. The right ventricular size is severely enlarged. There is severely elevated pulmonary artery systolic pressure. The estimated right ventricular systolic pressure is 94.2 mmHg.  3. Left atrial size was moderately dilated.  4. Right atrial size was severely dilated.  5. Moderate pericardial effusion. The pericardial effusion is posterior to the left ventricle and localized near the right atrium. There is no evidence of cardiac tamponade.  6. The mitral valve is abnormal. Trivial mitral valve regurgitation. No evidence of mitral stenosis. 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  7. The aortic valve is tricuspid. There is moderate calcification of the aortic valve. Aortic valve regurgitation is not visualized. Aortic valve  sclerosis/calcification is present, without any evidence of aortic stenosis.  8. Severely dilated pulmonary artery.  9. The inferior vena cava is dilated in size with <50% respiratory variability, suggesting right atrial pressure of 15 mmHg. 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 Pulmonary artery pressures are severely elevated. There is a moderate pericardial effusion related to her PAH/RV failure without evidence of tamponade. FINDINGS  Left Ventricle: Left ventricular ejection fraction, by estimation, is 60 to 65%. The left ventricle has normal function. The left ventricle has no regional wall motion abnormalities. The left  PROGRESS NOTE    Carrie Guzman  ZOX:[id number] DOB: 02-23-1946 DOA: 06/21/2023 PCP: Melida Quitter, MD    Brief Narrative:  77 year old with history of lymphedema, HTN, obesity admitted to the hospital with concerns of shortness of breath ongoing for 2-3 weeks.  Patient was diagnosed with CHF exacerbation.  She had elevated BNP, total bilirubin was elevated.  Upon admission started on diuretics.  Echocardiogram showed preserved EF with elevated PASP, cor pulmonale and pericardial effusion without tamponade.  CTA chest showed acute pulmonary embolism but right side heart strain, case discussed with pulmonary who recommended heparin otherwise no indication for thrombectomy at this time.  Initial plan was to transfer patient to [facility] for RHC but given acute pulmonary embolism, will monitor the patient     Assessment & Plan:  Principal Problem:   Acute CHF (congestive heart failure) (HCC) Active Problems:   Acute systolic (congestive) heart failure (HCC)   Acute respiratory distress with mild hypoxia Acute congestive heart failure, preserved ejection fraction" pulmonology, EF 60% Pericardial effusion without tamponade -Patient certainly has right-sided heart failure and signs of peripheral volume overload but given high risk of hypotension holding off on diuretics at this time.  Does have elevated BNP and mildly elevated troponin. Seen By cardiology.  Eventually may require RHC but will hold off on this due to acute pulmonary embolism with cor pulmonale.   Acute right upper lobe and subsegmental pulmonary embolism without cor pulmonale Moderate pleural effusion, left - Discussed case with pulmonary, Dr Isaiah Serge.  Currently there is no indication for thrombectomy.  Continue to closely monitor the patient on heparin drip. - Lower extremity Dopplers -Due to worsening thrombocytopenia, I will also discuss case with hematology, Dr Candise Che notified.  -Will need outpatient sleep  study  Thrombocytopenia - Platelets are dropping, down to 65 today.  No obvious signs of bleeding.  Continue heparin drip but will closely monitor this. Dr Candise Che will see the patient.   Low TSH and elevated free T4 - Likely euthyroid sick syndrome.  Will need to repeat in next 3-4 weeks   Elevated total bilirubin, improving - Unclear etiology.  Mild hepatic congestion secondary to right-sided RV dysfunction   Lymphedema -this appears to be chronic   Hypokalemia -Replete as needed   Hyperlipidemia -continue home Crestor    Hypertension -c currently on Coreg.  IV as needed   Morbid obesity-BMI 42.9, complicating all aspects of care     DVT prophylaxis: Heparin drip Code Status: Full code Family Communication: Husband present at bedside Status is: Inpatient Remains inpatient appropriate because: Ongoing management for acute pulmonary embolism                 Diet Orders (From admission, onward)     Start     Ordered   06/24/23 0001  Diet NPO time specified Except for: Sips with Meds  Diet effective midnight       Question:  Except for  Answer:  Clearance Coots with Meds   06/23/23 8119   06/23/23 0834  Diet Heart Room service appropriate? Yes; Fluid consistency: Thin; Fluid restriction: 1800 mL Fluid  Diet effective now       Question Answer Comment  Room service appropriate? Yes   Fluid consistency: Thin   Fluid restriction: 1800 mL Fluid      06/23/23 0833            Subjective: Feels ok, no new complaints.  No chest pain and SOB at rest.  1852  TSH 0.252*  FREET4 1.57*   Anemia Panel: No results for input(s): "VITAMINB12", "FOLATE", "FERRITIN", "TIBC", "IRON", "RETICCTPCT" in the last 72 hours. Sepsis Labs: No results for input(s): "PROCALCITON", "LATICACIDVEN" in the last 168 hours.  Recent Results (from the past 240 hour(s))  SARS Coronavirus 2 by RT PCR (hospital order, performed in [facility] hospital lab) *cepheid single result test* Anterior Nasal Swab     Status: None   Collection Time: 06/21/23 12:07 PM   Specimen: Anterior Nasal Swab  Result Value Ref Range Status   SARS Coronavirus 2 by RT PCR NEGATIVE NEGATIVE Final    Comment: (NOTE) SARS-CoV-2 target nucleic acids are NOT DETECTED.  The SARS-CoV-2 RNA is generally detectable in upper and lower respiratory specimens during the acute phase of infection. The lowest concentration of SARS-CoV-2 viral copies this assay can detect is 250 copies / mL. A negative result does not preclude SARS-CoV-2 infection and should not be used as the sole basis for treatment or other patient management decisions.  A negative result may occur with improper specimen collection / handling, submission of specimen other than nasopharyngeal swab, presence of viral mutation(s) within the areas targeted by this assay, and inadequate number of viral copies (<250 copies / mL).  A negative result must be combined with clinical observations, patient history, and epidemiological information.  Fact Sheet for Patients:   RoadLapTop.co.za  Fact Sheet for Healthcare Providers: http://kim-miller.com/  This test is not yet approved or  cleared by the [place] FDA and has been authorized for detection and/or diagnosis of SARS-CoV-2 by FDA under an Emergency Use Authorization (EUA).  This EUA will remain in effect (meaning this test can be used) for the duration of the COVID-19 declaration under Section 564(b)(1) of the Act, 21 U.S.C. section 360bbb-3(b)(1), unless the authorization is terminated or revoked sooner.  Performed at [facility], [address] [place], [place] [address]          Radiology Studies: VAS [place] LOWER EXTREMITY VENOUS (DVT)  Result Date: 06/23/2023  Lower Venous DVT Study Patient Name:  Carrie Guzman  Date of Exam:   06/23/2023 Medical Rec #: [id number]            Accession #:    [id number] Date of Birth: 18-Jan-1946            Patient Gender: F Patient Age:   31 years Exam Location:  [facility] Procedure:      VAS [place] LOWER EXTREMITY VENOUS (DVT) Referring Phys: Stephania Fragmin --------------------------------------------------------------------------------  Indications: Pulmonary embolism, and chronic leg swelling.  Limitations: Body habitus and poor ultrasound/tissue interface. Comparison Study: No previous study. Performing Technologist: McKayla Maag RVT, [place]  Examination Guidelines: A complete evaluation includes B-mode imaging, spectral Doppler, color Doppler, and power Doppler as needed of all accessible portions of each vessel. Bilateral testing is considered an integral part of a complete examination. Limited examinations for reoccurring indications may be performed as noted. The reflux portion of the exam is performed with the patient in reverse Trendelenburg.   +--------+---------------+---------+-----------+----------+--------------------+ RIGHT   CompressibilityPhasicitySpontaneityPropertiesThrombus Aging       +--------+---------------+---------+-----------+----------+--------------------+ CFV     Full           Yes      Yes                                       +--------+---------------+---------+-----------+----------+--------------------+  PROGRESS NOTE    Carrie Guzman  ZOX:[id number] DOB: 02-23-1946 DOA: 06/21/2023 PCP: Melida Quitter, MD    Brief Narrative:  77 year old with history of lymphedema, HTN, obesity admitted to the hospital with concerns of shortness of breath ongoing for 2-3 weeks.  Patient was diagnosed with CHF exacerbation.  She had elevated BNP, total bilirubin was elevated.  Upon admission started on diuretics.  Echocardiogram showed preserved EF with elevated PASP, cor pulmonale and pericardial effusion without tamponade.  CTA chest showed acute pulmonary embolism but right side heart strain, case discussed with pulmonary who recommended heparin otherwise no indication for thrombectomy at this time.  Initial plan was to transfer patient to [facility] for RHC but given acute pulmonary embolism, will monitor the patient     Assessment & Plan:  Principal Problem:   Acute CHF (congestive heart failure) (HCC) Active Problems:   Acute systolic (congestive) heart failure (HCC)   Acute respiratory distress with mild hypoxia Acute congestive heart failure, preserved ejection fraction" pulmonology, EF 60% Pericardial effusion without tamponade -Patient certainly has right-sided heart failure and signs of peripheral volume overload but given high risk of hypotension holding off on diuretics at this time.  Does have elevated BNP and mildly elevated troponin. Seen By cardiology.  Eventually may require RHC but will hold off on this due to acute pulmonary embolism with cor pulmonale.   Acute right upper lobe and subsegmental pulmonary embolism without cor pulmonale Moderate pleural effusion, left - Discussed case with pulmonary, Dr Isaiah Serge.  Currently there is no indication for thrombectomy.  Continue to closely monitor the patient on heparin drip. - Lower extremity Dopplers -Due to worsening thrombocytopenia, I will also discuss case with hematology, Dr Candise Che notified.  -Will need outpatient sleep  study  Thrombocytopenia - Platelets are dropping, down to 65 today.  No obvious signs of bleeding.  Continue heparin drip but will closely monitor this. Dr Candise Che will see the patient.   Low TSH and elevated free T4 - Likely euthyroid sick syndrome.  Will need to repeat in next 3-4 weeks   Elevated total bilirubin, improving - Unclear etiology.  Mild hepatic congestion secondary to right-sided RV dysfunction   Lymphedema -this appears to be chronic   Hypokalemia -Replete as needed   Hyperlipidemia -continue home Crestor    Hypertension -c currently on Coreg.  IV as needed   Morbid obesity-BMI 42.9, complicating all aspects of care     DVT prophylaxis: Heparin drip Code Status: Full code Family Communication: Husband present at bedside Status is: Inpatient Remains inpatient appropriate because: Ongoing management for acute pulmonary embolism                 Diet Orders (From admission, onward)     Start     Ordered   06/24/23 0001  Diet NPO time specified Except for: Sips with Meds  Diet effective midnight       Question:  Except for  Answer:  Clearance Coots with Meds   06/23/23 8119   06/23/23 0834  Diet Heart Room service appropriate? Yes; Fluid consistency: Thin; Fluid restriction: 1800 mL Fluid  Diet effective now       Question Answer Comment  Room service appropriate? Yes   Fluid consistency: Thin   Fluid restriction: 1800 mL Fluid      06/23/23 0833            Subjective: Feels ok, no new complaints.  No chest pain and SOB at rest.  PROGRESS NOTE    Carrie Guzman  ZOX:[id number] DOB: 02-23-1946 DOA: 06/21/2023 PCP: Melida Quitter, MD    Brief Narrative:  77 year old with history of lymphedema, HTN, obesity admitted to the hospital with concerns of shortness of breath ongoing for 2-3 weeks.  Patient was diagnosed with CHF exacerbation.  She had elevated BNP, total bilirubin was elevated.  Upon admission started on diuretics.  Echocardiogram showed preserved EF with elevated PASP, cor pulmonale and pericardial effusion without tamponade.  CTA chest showed acute pulmonary embolism but right side heart strain, case discussed with pulmonary who recommended heparin otherwise no indication for thrombectomy at this time.  Initial plan was to transfer patient to [facility] for RHC but given acute pulmonary embolism, will monitor the patient     Assessment & Plan:  Principal Problem:   Acute CHF (congestive heart failure) (HCC) Active Problems:   Acute systolic (congestive) heart failure (HCC)   Acute respiratory distress with mild hypoxia Acute congestive heart failure, preserved ejection fraction" pulmonology, EF 60% Pericardial effusion without tamponade -Patient certainly has right-sided heart failure and signs of peripheral volume overload but given high risk of hypotension holding off on diuretics at this time.  Does have elevated BNP and mildly elevated troponin. Seen By cardiology.  Eventually may require RHC but will hold off on this due to acute pulmonary embolism with cor pulmonale.   Acute right upper lobe and subsegmental pulmonary embolism without cor pulmonale Moderate pleural effusion, left - Discussed case with pulmonary, Dr Isaiah Serge.  Currently there is no indication for thrombectomy.  Continue to closely monitor the patient on heparin drip. - Lower extremity Dopplers -Due to worsening thrombocytopenia, I will also discuss case with hematology, Dr Candise Che notified.  -Will need outpatient sleep  study  Thrombocytopenia - Platelets are dropping, down to 65 today.  No obvious signs of bleeding.  Continue heparin drip but will closely monitor this. Dr Candise Che will see the patient.   Low TSH and elevated free T4 - Likely euthyroid sick syndrome.  Will need to repeat in next 3-4 weeks   Elevated total bilirubin, improving - Unclear etiology.  Mild hepatic congestion secondary to right-sided RV dysfunction   Lymphedema -this appears to be chronic   Hypokalemia -Replete as needed   Hyperlipidemia -continue home Crestor    Hypertension -c currently on Coreg.  IV as needed   Morbid obesity-BMI 42.9, complicating all aspects of care     DVT prophylaxis: Heparin drip Code Status: Full code Family Communication: Husband present at bedside Status is: Inpatient Remains inpatient appropriate because: Ongoing management for acute pulmonary embolism                 Diet Orders (From admission, onward)     Start     Ordered   06/24/23 0001  Diet NPO time specified Except for: Sips with Meds  Diet effective midnight       Question:  Except for  Answer:  Clearance Coots with Meds   06/23/23 8119   06/23/23 0834  Diet Heart Room service appropriate? Yes; Fluid consistency: Thin; Fluid restriction: 1800 mL Fluid  Diet effective now       Question Answer Comment  Room service appropriate? Yes   Fluid consistency: Thin   Fluid restriction: 1800 mL Fluid      06/23/23 0833            Subjective: Feels ok, no new complaints.  No chest pain and SOB at rest.

## 2023-06-23 NOTE — Progress Notes (Signed)
ANTICOAGULATION CONSULT NOTE Pharmacy Consult for Heparin  Indication: pulmonary embolus  No Known Allergies  Patient Measurements: Height: 5\' 4"  (162.6 cm) Weight: 122.4 kg (269 lb 12.8 oz) IBW/kg (Calculated) : 54.7 Heparin Dosing Weight: 82 kg  Vital Signs: Temp: 98.4 F (36.9 C) (08/16 0429) Temp Source: Oral (08/16 0429) BP: 118/51 (08/16 0429) Pulse Rate: 58 (08/16 0429)  Labs: Recent Labs    06/21/23 1153 06/21/23 1331 06/21/23 1332 06/22/23 0452 06/23/23 0549  HGB 15.9*  --  15.3*  --  14.3  HCT 49.2*  --  45.0  --  44.9  PLT 111*  --   --   --  65*  LABPROT 16.2*  --   --   --   --   INR 1.3*  --   --   --   --   HEPARINUNFRC  --   --   --   --  0.84*  CREATININE 0.83  --   --  0.80 0.45  TROPONINIHS 22* 27*  --   --   --     Estimated Creatinine Clearance: 78.5 mL/min (by C-G formula based on SCr of 0.45 mg/dL).   Medical History: Past Medical History:  Diagnosis Date   Elevated cholesterol    Hypertension    Squamous cell carcinoma of skin 05/12/2021   Left Lower Leg - anterior (in situ) (curet and 5FU)    Medications:  Scheduled:   aspirin EC  81 mg Oral Daily   carvedilol  6.25 mg Oral BID WC   Chlorhexidine Gluconate Cloth  6 each Topical Daily   furosemide  40 mg Intravenous TID   losartan  50 mg Oral Daily   rosuvastatin  10 mg Oral Daily   Infusions:   heparin 1,400 Units/hr (06/22/23 2146)    Assessment: 77 yoF admitted on 8/14 with CHF exacerbation.  On 8/15 CT is positive for PE, pharmacy is consulted to dose heparin.  She has been on prophylactic Lovenox 0.5 mg/kg daily - most recent dose on 8/15 at 18:00 Baseline INR elevated at 1.3 (8/14) CBC: Hgb 15.3, Plt 111 (last on 06/21/23)  06/23/2023: Initial heparin level 0.84- elevated on IV heparin 1400 units/hr CBC: Hg WNL, pltc low/decreased 65 No bleeding or infusion related issues reported by RN Confirmed with RN/patient that lab was drawn from opposite arm as heparin  infusion  Goal of Therapy:  Heparin level 0.3-0.7 Monitor platelets by anticoagulation protocol: Yes   Plan:  Hold heparin x1 hour, then resume at 1200 units/hr Recheck heparin level 8 hours after restarting (~1530) Daily heparin level and CBC.  Watch plts closely. F/U plans for RHC  Junita Push, PharmD, BCPS 06/23/2023 6:31 AM

## 2023-06-23 NOTE — Progress Notes (Signed)
Patient has a little nose bleed; not continuous; notified pharmacy of update and messaged doctor; also notified oncoming nurse to assess

## 2023-06-23 NOTE — Progress Notes (Addendum)
Progress Note  Patient Name: Carrie Guzman Date of Encounter: 06/23/2023  [facility] Cardiologist: None   Patient Profile     Subjective   SOB improved. No CP.  On for RHC today to assess severe PHTN but found to have acute PE by CTA last night  Inpatient Medications    Scheduled Meds:  aspirin EC  81 mg Oral Daily   carvedilol  6.25 mg Oral BID WC   Chlorhexidine Gluconate Cloth  6 each Topical Daily   furosemide  40 mg Intravenous TID   losartan  50 mg Oral Daily   rosuvastatin  10 mg Oral Daily   Continuous Infusions:  [START ON 06/24/2023] sodium chloride     heparin     PRN Meds: acetaminophen **OR** acetaminophen, guaiFENesin, hydrALAZINE, ipratropium-albuterol, metoprolol tartrate, ondansetron **OR** ondansetron (ZOFRAN) IV, mouth rinse, senna-docusate, traZODone   Vital Signs    Vitals:   06/22/23 2356 06/23/23 0210 06/23/23 0429 06/23/23 0431  BP: 133/60  (!) 118/51   Pulse: 63  (!) 58   Resp: 18 18 17    Temp: 98.4 F (36.9 C)  98.4 F (36.9 C)   TempSrc: Oral  Oral   SpO2: 94% 94% 93%   Weight:    122.4 kg  Height:        Intake/Output Summary (Last 24 hours) at 06/23/2023 0803 Last data filed at 06/23/2023 0431 Gross per 24 hour  Intake 295.76 ml  Output 3450 ml  Net -3154.24 ml      06/23/2023    4:31 AM 06/22/2023    5:00 AM 06/21/2023    5:00 PM  Last 3 Weights  Weight (lbs) 269 lb 12.8 oz 272 lb 14.9 oz 250 lb  Weight (kg) 122.38 kg 123.8 kg 113.4 kg      Telemetry    NSR with PVCs - Personally Reviewed  ECG    No new EKG to review - Personally Reviewed  Physical Exam   GEN: No acute distress.  Morbidly obese Neck: body habitus limits assessment of JVD Cardiac: RRR, no murmurs, rubs, or gallops.  Respiratory: decreased BS at bases GI: Soft, nontender, non-distended  MS: 2+ bilateral lymphedema with chronic skin thickening; No deformity. Neuro:  Nonfocal  Psych: Normal affect   Labs    High Sensitivity  Troponin:   Recent Labs  Lab 06/21/23 1153 06/21/23 1331  TROPONINIHS 22* 27*      Chemistry Recent Labs  Lab 06/21/23 1153 06/21/23 1332 06/22/23 0452 06/23/23 0549  NA 144 146* 144 141  K 4.3 3.1* 3.6 4.1  CL 109  --  109 106  CO2 22  --  29 26  GLUCOSE 131*  --  92 99  BUN 28*  --  28* 23  CREATININE 0.83  --  0.80 0.45  CALCIUM 11.5*  --  10.2 9.9  PROT 7.5  --   --  5.7*  ALBUMIN 4.0  --   --  2.8*  AST 43*  --   --  28  ALT 14  --   --  17  ALKPHOS 77  --   --  76  BILITOT 5.8*  --   --  3.3*  GFRNONAA >60  --  >60 >60  ANIONGAP 13  --  6 9     Hematology Recent Labs  Lab 06/21/23 1153 06/21/23 1332 06/23/23 0549  WBC 7.6  --  8.8  RBC 4.88  --  4.40  HGB 15.9* 15.3*  14.3  HCT 49.2* 45.0 44.9  MCV 100.8*  --  102.0*  MCH 32.6  --  32.5  MCHC 32.3  --  31.8  RDW 18.4*  --  17.9*  PLT 111*  --  65*    BNP Recent Labs  Lab 06/21/23 1203  BNP 1,771.2*     DDimer No results for input(s): "DDIMER" in the last 168 hours.   Radiology    CT Angio Chest Pulmonary Embolism (PE) W or WO Contrast  Addendum Date: 06/22/2023   ADDENDUM REPORT: 06/22/2023 20:46 ADDENDUM: Critical Value/emergent results were called by telephone at the time of interpretation on 06/22/2023 at 8:45 pm to provider Ruffin Pyo, who verbally acknowledged these results. Electronically Signed   By: Thornell Sartorius M.D.   On: 06/22/2023 20:46   Result Date: 06/22/2023 CLINICAL DATA:  Pulmonary embolism suspected, high probability. EXAM: CT ANGIOGRAPHY CHEST WITH CONTRAST TECHNIQUE: Multidetector CT imaging of the chest was performed using the standard protocol during bolus administration of intravenous contrast. Multiplanar CT image reconstructions and MIPs were obtained to evaluate the vascular anatomy. RADIATION DOSE REDUCTION: This exam was performed according to the departmental dose-optimization program which includes automated exposure control, adjustment of the mA and/or kV according  to patient size and/or use of iterative reconstruction technique. CONTRAST:  75mL OMNIPAQUE IOHEXOL 350 MG/ML SOLN COMPARISON:  None Available. FINDINGS: Cardiovascular: Heart is enlarged and there is a moderate-to-large pericardial effusion. Few scattered coronary artery calcifications are noted. There is atherosclerotic calcification of the aorta without evidence of aneurysm. The pulmonary trunk is distended suggesting underlying pulmonary artery hypertension. No definite evidence of pulmonary embolism. Segmental and subsegmental pulmonary emboli are noted in the right upper lobe. The right ventricle is markedly distended, concerning for right heart strain. Mediastinum/Nodes: No mediastinal, hilar, or axillary lymphadenopathy. There is a 2.5 cm enhancing nodule in the left lobe of the thyroid gland. The trachea and esophagus are within normal limits. Lungs/Pleura: There is a moderate right pleural effusion with compressive atelectasis or infiltrate. Patchy atelectasis or infiltrate is present in the lungs bilaterally. No pneumothorax. Upper Abdomen: The gallbladder is surgically absent. A 1.1 cm exophytic nodule is noted in the mid left kidney with indeterminate imaging characteristics. Mild anasarca is noted. There is partial visualization of herniation of small bowel in the subcutaneous tissues in the right upper quadrant. Musculoskeletal: Degenerative changes are present in the thoracic spine. No acute fracture. Review of the MIP images confirms the above findings. IMPRESSION: 1. Right upper lobe segmental and subsegmental pulmonary emboli. There is evidence of right heart strain. 2. Cardiomegaly with large pericardial effusion. 3. Moderate right pleural effusion. 4. Patchy airspace disease bilaterally with consolidation in the right lower lobe, possible atelectasis or infiltrate. 5. Aortic atherosclerosis and coronary artery calcifications. 6. Complex hyperdense lesion in the mid left kidney. Nonemergent  ultrasound is recommended for further characterization. 7. Partial visualization of herniated small bowel in the right upper quadrant. Electronically Signed: By: Thornell Sartorius M.D. On: 06/22/2023 20:14   ECHOCARDIOGRAM COMPLETE  Result Date: 06/22/2023    ECHOCARDIOGRAM REPORT   Patient Name:   JERRIKA BEAGLEY Helsley Date of Exam: 06/22/2023 Medical Rec #:  [id number]           Height:       64.0 in Accession #:    [id number]          Weight:       272.9 lb Date of Birth:  [birth year]  BSA:          2.233 m Patient Age:    76 years            BP:           100/64 mmHg Patient Gender: F                   HR:           58 bpm. Exam Location:  Inpatient Procedure: 2D Echo, 3D Echo, Cardiac Doppler and Color Doppler Indications:    I50.40* Unspecified combined systolic (congestive) and diastolic                 (congestive) heart failure  History:        Patient has no prior history of Echocardiogram examinations.                 CHF, Signs/Symptoms:Edema, Shortness of Breath and Dyspnea; Risk                 Factors:Hypertension.  Sonographer:    Sheralyn Boatman RDCS Referring Phys: [id number] MIR M [facility]  Sonographer Comments: Patient is obese and suboptimal subcostal window. Image acquisition challenging due to patient body habitus. IMPRESSIONS  1. Left ventricular ejection fraction, by estimation, is 60 to 65%. The left ventricle has normal function. The left ventricle has no regional wall motion abnormalities. Left ventricular diastolic parameters are consistent with Grade I diastolic dysfunction (impaired relaxation). There is abnormal (paradoxical) septal motion, consistent with right ventricular volume overload and the interventricular septum is flattened in systole and diastole, consistent with right ventricular pressure and volume overload.  2. Right ventricular systolic function is severely reduced. The right ventricular size is severely enlarged. There is severely elevated pulmonary artery systolic  pressure. The estimated right ventricular systolic pressure is 94.2 mmHg.  3. Left atrial size was moderately dilated.  4. Right atrial size was severely dilated.  5. Moderate pericardial effusion. The pericardial effusion is posterior to the left ventricle and localized near the right atrium. There is no evidence of cardiac tamponade.  6. The mitral valve is abnormal. Trivial mitral valve regurgitation. No evidence of mitral stenosis. 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  7. The aortic valve is tricuspid. There is moderate calcification of the aortic valve. Aortic valve regurgitation is not visualized. Aortic valve sclerosis/calcification is present, without any evidence of aortic stenosis.  8. Severely dilated pulmonary artery.  9. The inferior vena cava is dilated in size with <50% respiratory variability, suggesting right atrial pressure of 15 mmHg. 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 Pulmonary artery pressures are severely elevated. There is a moderate pericardial effusion related to her PAH/RV failure without evidence of tamponade. FINDINGS  Left Ventricle: Left ventricular ejection fraction, by estimation, is 60 to 65%. The left ventricle has normal function. The left ventricle has no regional wall motion abnormalities. The left ventricular internal cavity size was small. There is no left ventricular hypertrophy. Abnormal (paradoxical) septal motion, consistent with right ventricular volume overload and the interventricular septum is flattened in systole and diastole, consistent with right ventricular pressure and volume overload. Left ventricular diastolic parameters are consistent with Grade I diastolic dysfunction (impaired relaxation). Right Ventricle: The right ventricular size is severely enlarged. No increase in right ventricular wall thickness. Right ventricular systolic function  is severely reduced. There is severely elevated pulmonary artery systolic pressure. The tricuspid  regurgitant velocity is 4.45 m/s, and with an assumed right atrial pressure of 15 mmHg, the estimated right ventricular systolic pressure is 94.2 mmHg. Left Atrium: Left atrial size was moderately dilated. Right Atrium: Right atrial size was severely dilated. Pericardium: A moderately sized pericardial effusion is present. The pericardial effusion is posterior to the left ventricle and localized near the right atrium. There is no evidence of cardiac tamponade. Mitral Valve: The mitral valve is abnormal. 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 There is mild calcification of the mitral valve leaflet(s). Trivial mitral valve regurgitation. No evidence of mitral valve stenosis. Tricuspid Valve: The tricuspid valve is grossly normal. Tricuspid valve regurgitation is mild . No evidence of tricuspid stenosis. Aortic Valve: The aortic valve is tricuspid. There is moderate calcification of the aortic valve. Aortic valve regurgitation is not visualized. Aortic valve sclerosis/calcification is present, without any evidence of aortic stenosis. Aortic valve mean gradient measures 7.0 mmHg. Aortic valve peak gradient measures 13.2 mmHg. Aortic valve area, by VTI measures 2.79 cm. Pulmonic Valve: The pulmonic valve was normal in structure. Pulmonic valve regurgitation is mild. No evidence of pulmonic stenosis. Aorta: The aortic root and ascending aorta are structurally normal, with no evidence of dilitation. Pulmonary Artery: The pulmonary artery is severely dilated. Venous: The inferior vena cava is dilated in size with less than 50% respiratory variability, suggesting right atrial pressure of 15 mmHg. IAS/Shunts: No atrial level shunt detected by color flow Doppler.  LEFT VENTRICLE PLAX 2D LVIDd:         4.00 cm     Diastology LVIDs:         2.80 cm     LV e' medial:    3.92 cm/s LV PW:         1.50 cm     LV  E/e' medial:  24.4 LV IVS:        1.60 cm     LV e' lateral:   5.55 cm/s LVOT diam:     2.20 cm     LV E/e' lateral: 17.2 LV SV:         115 LV SV Index:   51 LVOT Area:     3.80 cm  LV Volumes (MOD) LV vol d, MOD A2C: 81.9 ml LV vol d, MOD A4C: 45.1 ml LV vol s, MOD A2C: 17.4 ml LV vol s, MOD A4C: 14.4 ml LV SV MOD A2C:     64.5 ml LV SV MOD A4C:     45.1 ml LV SV MOD BP:      43.9 ml RIGHT VENTRICLE            IVC RV S prime:     7.18 cm/s  IVC diam: 3.00 cm RVOT diam:      3.60 cm TAPSE (M-mode): 2.1 cm LEFT ATRIUM             Index        RIGHT ATRIUM           Index LA diam:        3.10 cm 1.39 cm/m   RA Area:     29.70 cm LA Vol (A2C):   70.1 ml 31.40 ml/m  RA Volume:   122.00 ml 54.64 ml/m LA Vol (A4C):   54.2 ml 24.28 ml/m LA Biplane Vol: 61.7 ml 27.63 ml/m  AORTIC VALVE  PULMONIC VALVE AV Area (Vmax):    2.99 cm      PR End Diast Vel: 2.54 msec AV Area (Vmean):   2.97 cm AV Area (VTI):     2.79 cm AV Vmax:           182.00 cm/s AV Vmean:          117.000 cm/s AV VTI:            0.412 m AV Peak Grad:      13.2 mmHg AV Mean Grad:      7.0 mmHg LVOT Vmax:         143.00 cm/s LVOT Vmean:        91.500 cm/s LVOT VTI:          0.302 m LVOT/AV VTI ratio: 0.73  AORTA Ao Root diam: 3.00 cm Ao Asc diam:  3.10 cm MITRAL VALVE                TRICUSPID VALVE MV Area (PHT): 2.50 cm     TR Peak grad:   79.2 mmHg MV Decel Time: 303 msec     TR Vmax:        445.00 cm/s MV E velocity: 95.50 cm/s MV A velocity: 112.00 cm/s  SHUNTS MV E/A ratio:  0.85         Systemic VTI:  0.30 m                             Systemic Diam: 2.20 cm                             Pulmonic Diam: 3.60 cm Arvilla Meres MD Electronically signed by Arvilla Meres MD Signature Date/Time: 06/22/2023/11:48:17 AM    Final    DG Chest Port 1 View  Result Date: 06/21/2023 CLINICAL DATA:  Provided history: Shortness of breath. EXAM: PORTABLE CHEST 1 VIEW COMPARISON:  Chest radiograph 09/27/2009. FINDINGS: Cardiomegaly.  Aortic atherosclerosis. Central pulmonary vascular congestion. Prominence of the interstitial lung markings consistent with interstitial edema. Ill-defined opacity within the right lung base. Trace right pleural effusion. No appreciable airspace consolidation on left. No evidence of pneumothorax. No acute osseous abnormality identified. Spondylosis and dextrocurvature of the thoracic spine. Degenerative changes of the left glenohumeral and bilateral acromioclavicular joints. IMPRESSION: 1. Cardiomegaly with central pulmonary vascular congestion and interstitial edema. 2. Ill-defined opacity within the right lung base, which may reflect atelectasis and/or airspace consolidation. 3. Trace right pleural effusion. 4. Aortic Atherosclerosis (ICD10-I70.0). Electronically Signed   By: Jackey Loge D.O.   On: 06/21/2023 13:47    Patient Profile     77 y.o. female  with a hx of lymphedema, htn, obesity (BMI 46), HLD who is being seen 06/22/2023 for the evaluation of pulmonary HTN at the request of Dr. Nelson Chimes.   Assessment & Plan    Severe pulmonary HTN  Right sided heart failure  Acute HFpEF  Moderate pericardial effusion  - Echocardiogram this admission showed EF 60-65%, no regional wall motion abnormalities, grade I DD, severe pulmonary HTN with severe RV dilation and dysfunction consistent with cor pulmonale. There also is a moderate pericardial effusion without tamponade - Patient presented with shortness of breath, fatigue. BNP elevated to 1771.2 CXR with cardiomegaly and central pulmonary vascular congestion and interstitial edema  -suspect this is multifactorial from NEW acute PE as well as a combination of WHO group  2 (pulmonary venous HTN from CHF) and WHO group 3 (undiagnosed OSA and OHS from morbid obesity) - she was on for RHC today but given new dx of acute PE will cancel the cath - currently on IV lasix 40 mg BID and put out 3.4L yesterday and is net neg 4.3L since admit - She got hypotensive  overnight so will stop diuretics for now - chest CT showed acute RUL segmental and subsegmental pulmonary emboli with right heart strain as well as patchy airspace disease bilaterally with possible infiltrate in RLL  - will ask pulmonary to see .  Doubt she is a candidate for thrombectomy as I suspect that the PE is not the primary etiology of her PHTN with cor pulmonale. She does not have a saddle embolism and PE is segmental and subsegmental in only 1 lobe.  She also has morbid obesity and likely has OSA/OHV so hard to tell how much her PHTN/cor pulmonale is from chronic hypoxemia and how much is from acute PE - continue IV heparin per pharmacy - will get LE venous dopplers - Recommended sleep study for OSA outpt - she has a moderate Pericardial effusion on echo (read as severe on CT) with no evidence of tamponade - Will need repeat limited echocardiogram in 2-3 weeks to reassess pericardial effusion    Elevated Troponin  - hsTn 22, 27 -- trend not consistent with ACS and likely demand ischemia in the setting of acute PE - No chest pain  - Echo this admission with normal EF, no WMAs - No plans for ischemic evaluation at this time. Suspect this is demand ischemia in the setting of CHF exacerbation    HTN  - Bp stable this am but hypotensive overnight - Hold Losartan   Obesity  - BMP 46.85 - Surely contributing to pulmonary HTN   Otherwise per primary  - Low TSH  - Elevated bilirubin  - Lymphedema    Total time spent with patient today 40 minutes. This includes reviewing records, evaluating the patient and coordinating care. Face-to-face time >50%.    For questions or updates, please contact [facility] Please consult [email or web address] for contact info under        Signed, Armanda Magic, MD  06/23/2023, 8:03 AM

## 2023-06-23 NOTE — Consult Note (Signed)
NAME:  Carrie Guzman, MRN:  [id number], DOB:  Jan 07, 1946, LOS: 2 ADMISSION DATE:  06/21/2023, CONSULTATION DATE:  06/23/2023 REFERRING MD:  Stephania Fragmin - [facility] , CHIEF COMPLAINT:  PE   History of Present Illness:  Carrie Guzman is a 77yo female with a past medical history significant for HTN, HLD, squamous cell skin cancer, and obesity who presented to the ED 8/14 for worsening lower extremity swelling, dyspnea, and cough.  Workup on admission revealed elevated bilirubin, BNP and elevated troponin.  Patient was admitted per hospitalist for further management of acute failure.   Echocardiogram was completed on arrival and revealed preserved EF of 60 to 65% and no WMA but revealed grade 1 diastolic function with severe pulmonary artery hypertension with severe RA dilation and dysfunction consistent with cor pulmonale.  CTA chest was then obtained  which revealed right upper lobe segmental and subsegmental pulmonary emboli with CT evidence of right heart strain, large pericardial effusion, and moderate right pleural effusion.  Pulmonary critical care consulted for valuation of potential intervention to PE.   Pertinent  Medical History  HTN, HLD, squamous cell skin cancer, and obesity  Significant Hospital Events: Including procedures, antibiotic start and stop dates in addition to other pertinent events   8/15 presented with dyspnea, lower extremity edema, and cough found to be in acute right heart failure 8/16 CTA chest overnight consistent with acute PE, PCCM consulted for assistance in management  Interim History / Subjective:  Seen lying in bed with no acute complaints, Carrie Guzman at bedside and provided majority of history  Objective   Blood pressure (!) 118/51, pulse 60, temperature 98.4 F (36.9 C), temperature source Oral, resp. rate 17, height 5\' 4"  (1.626 m), weight 122.4 kg, SpO2 93%.        Intake/Output Summary (Last 24 hours) at 06/23/2023 0959 Last data filed at 06/23/2023  0431 Gross per 24 hour  Intake 295.76 ml  Output 3450 ml  Net -3154.24 ml   Filed Weights   06/21/23 1700 06/22/23 0500 06/23/23 0431  Weight: 113.4 kg 123.8 kg 122.4 kg    Examination: General: Acute on chronic ill-appearing elderly female lying in bed in no acute distress HEENT: [place]/AT, MM pink/moist, PERRL,  Neuro: Alert and oriented x 3, nonfocal CV: s1s2 regular rate and rhythm, no murmur, rubs, or gallops,  PULM: Clear to auscultation bilaterally, diminished, no increased work of breathing, on 2 L nasal cannula GI: soft, bowel sounds active in all 4 quadrants, non-tender, non-distended, tolerating oral diet Extremities: warm/dry, no edema  Skin: no rashes or lesions  Resolved Hospital Problem list     Assessment & Plan:  Acute right lower lobe pulmonary embolism - CTA chest on admit revealed RUL segmental and subsegmental pulmonary emboli with CT evidence of right heart strain -Patient's underlying lymphedema and venous stasis ulcers Limited mobility, this is likely contributing factor to PE 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  Continue IV heparin drip with close monitoring of platelets.  Consider holding heparin drip if platelet count drops below 50 Cardiology following for right heart failure Continuous telemetry  Heart health diet  Strict intake  and output  Daily weight to assess volume status Daily assessment for need to diurese  Closely monitor renal function and electrolytes  Nocturnal oximetry to assess for  OSA NIV with BIPAP thereapy may be indicated, will need sleep study as outpatient  Best Practice (right click and "Reselect all SmartList Selections" daily)  Per primary  Labs   CBC: Recent Labs  Lab 06/21/23 1153 06/21/23 1332 06/23/23 0549  WBC 7.6  --  8.8  HGB 15.9* 15.3* 14.3  HCT 49.2* 45.0 44.9  MCV 100.8*  --  102.0*  PLT 111*  --  65*    Basic Metabolic Panel: Recent Labs  Lab 06/21/23 1153 06/21/23 1332 06/22/23 0452 06/23/23 0549  NA 144 146* 144 141  K 4.3 3.1* 3.6 4.1  CL 109  --  109 106  CO2 22  --  29 26  GLUCOSE 131*  --  92 99  BUN 28*  --  28* 23  CREATININE 0.83  --  0.80 0.45  CALCIUM 11.5*  --  10.2 9.9  MG  --   --   --  1.9  PHOS  --   --   --  2.1*   GFR: Estimated Creatinine Clearance: 78.5 mL/min (by C-G formula based on SCr of 0.45 mg/dL). Recent Labs  Lab 06/21/23 1153 06/23/23 0549  WBC 7.6 8.8    Liver Function Tests: Recent Labs  Lab 06/21/23 1153 06/23/23 0549  AST 43* 28  ALT 14 17  ALKPHOS 77 76  BILITOT 5.8* 3.3*  PROT 7.5 5.7*  ALBUMIN 4.0 2.8*   No results for input(s): "LIPASE", "AMYLASE" in the last 168 hours. Recent Labs  Lab 06/21/23 1331  AMMONIA 24    ABG    Component Value Date/Time   HCO3 29.0 (H) 06/21/2023 1332   TCO2 31 06/21/2023 1332   O2SAT 46 06/21/2023 1332     Coagulation Profile: Recent Labs  Lab 06/21/23 1153  INR 1.3*    Cardiac Enzymes: No results for input(s): "CKTOTAL", "CKMB", "CKMBINDEX", "TROPONINI" in the last 168 hours.  HbA1C: No results found for: "HGBA1C"  CBG: Recent Labs  Lab 06/22/23 2040 06/23/23 0811  GLUCAP 139* 98    Review of Systems:   Please see the history of present illness. All other systems reviewed and are negative   Past Medical History:  She,  has a past medical history of Elevated cholesterol, Hypertension, and Squamous cell carcinoma of skin (05/12/2021).   Surgical History:  History reviewed. No pertinent surgical history.    Social History:   reports that she has never smoked. She has never been exposed to tobacco smoke. She has never used smokeless tobacco. She reports that she does not drink alcohol and does not use drugs.   Family History:  Her family history is not on file.   Allergies No Known Allergies   Home Medications  Prior to Admission medications   Medication Sig Start Date End Date Taking? Authorizing Provider  acetaminophen (TYLENOL) 650 MG CR tablet Take 1,300 mg by mouth as needed for pain.   Yes [provider]  atenolol (TENORMIN) 50 MG tablet Take by mouth. 03/02/18  Yes [provider]  furosemide (LASIX) 40 MG tablet Take 1 tablet by mouth daily. 03/02/18  Yes [provider]  losartan (COZAAR) 50 MG tablet Take by mouth. 03/02/18  Yes [provider]  Menthol, Topical Analgesic, (BIOFREEZE EX) Apply 1 Application topically daily.   Yes [provider]  Multiple Vitamins-Minerals (CENTRUM SILVER 50+WOMEN) TABS Take 1 tablet by mouth daily.   Yes [provider]  Omega-3 Fatty Acids (FISH OIL BURP-LESS PO) Take 1 capsule by mouth daily.   Yes [provider]  potassium chloride (KLOR-CON) 10 MEQ tablet Take by mouth. 03/02/18  Yes [provider]  rosuvastatin (CRESTOR) 10 MG tablet Take by mouth. 03/02/18  Yes [provider]  Turmeric (QC TUMERIC COMPLEX PO) Take 1 tablet by mouth daily.   Yes [provider]     Critical care time:   Laquincy Eastridge D. Harris, NP-C [facility] Personal contact information can be found on Amion  If no contact or response made please call [phone] 06/23/2023, 12:51 PM

## 2023-06-23 NOTE — Consult Note (Signed)
Body mass index is 46.31 kg/m.  GENERAL:alert, in no acute distress and comfortable SKIN: no acute rashes, no significant lesions EYES: conjunctiva are pink and non-injected, sclera anicteric OROPHARYNX: MMM, no exudates, no oropharyngeal erythema or ulceration NECK: supple, no JVD LYMPH:  no palpable lymphadenopathy in the cervical, axillary or inguinal regions LUNGS: clear to auscultation b/l with normal respiratory effort HEART: regular rate & rhythm ABDOMEN:  normoactive bowel sounds , non tender, not distended. Extremity: Lateral 3+ pedal edema with bilateral induration and erythema suggestive of venous insufficiency with venous stasis dermatitis versus early cellulitis. PSYCH: alert & oriented x 3 with fluent speech NEURO: no focal motor/sensory deficits  LABORATORY DATA:  I have reviewed the data as listed  .    Latest Ref Rng & Units 06/23/2023    5:49 AM 06/21/2023    1:32 PM 06/21/2023   11:53 AM  CBC  WBC 4.0 - 10.5 K/uL 8.8   7.6   Hemoglobin 12.0 - 15.0 g/dL 01.0  27.2  53.6   Hematocrit 36.0 - 46.0 % 44.9  45.0  49.2   Platelets 150 - 400 K/uL 65   111     .    Latest Ref Rng & Units 06/23/2023    5:49 AM 06/22/2023    4:52 AM 06/21/2023    1:32 PM  CMP  Glucose 70 - 99 mg/dL 99  92    BUN 8 - 23 mg/dL 23  28    Creatinine 6.44 - 1.00 mg/dL 0.34  7.42    Sodium 595 - 145 mmol/L 141  144  146    Potassium 3.5 - 5.1 mmol/L 4.1  3.6  3.1   Chloride 98 - 111 mmol/L 106  109    CO2 22 - 32 mmol/L 26  29    Calcium 8.9 - 10.3 mg/dL 9.9  63.8    Total Protein 6.5 - 8.1 g/dL 5.7     Total Bilirubin 0.3 - 1.2 mg/dL 3.3     Alkaline Phos 38 - 126 U/L 76     AST 15 - 41 U/L 28     ALT 0 - 44 U/L 17        RADIOGRAPHIC STUDIES: I have personally reviewed the radiological images as listed and agreed with the findings in the report. VAS [place] LOWER EXTREMITY VENOUS (DVT)  Result Date: 06/23/2023  Lower Venous DVT Study Patient Name:  Carrie Guzman  Date of Exam:   06/23/2023 Medical Rec #: [id number]            Accession #:    [id number] Date of Birth: 1946/09/30            Patient Gender: F Patient Age:   77 years Exam Location:  [facility] Procedure:      VAS [place] LOWER EXTREMITY VENOUS (DVT) Referring Phys: Stephania Fragmin --------------------------------------------------------------------------------  Indications: Pulmonary embolism, and chronic leg swelling.  Limitations: Body habitus and poor ultrasound/tissue interface. Comparison Study: No previous study. Performing Technologist: McKayla Maag RVT, [place]  Examination Guidelines: A complete evaluation includes B-mode imaging, spectral Doppler, color Doppler, and power Doppler as needed of all accessible portions of each vessel. Bilateral testing is considered an integral part of a complete examination. Limited examinations for reoccurring indications may be performed as noted. The reflux portion of the exam is performed with the patient in reverse Trendelenburg.  +--------+---------------+---------+-----------+----------+--------------------+ RIGHT   CompressibilityPhasicitySpontaneityPropertiesThrombus Aging       +--------+---------------+---------+-----------+----------+--------------------+ CFV  Body mass index is 46.31 kg/m.  GENERAL:alert, in no acute distress and comfortable SKIN: no acute rashes, no significant lesions EYES: conjunctiva are pink and non-injected, sclera anicteric OROPHARYNX: MMM, no exudates, no oropharyngeal erythema or ulceration NECK: supple, no JVD LYMPH:  no palpable lymphadenopathy in the cervical, axillary or inguinal regions LUNGS: clear to auscultation b/l with normal respiratory effort HEART: regular rate & rhythm ABDOMEN:  normoactive bowel sounds , non tender, not distended. Extremity: Lateral 3+ pedal edema with bilateral induration and erythema suggestive of venous insufficiency with venous stasis dermatitis versus early cellulitis. PSYCH: alert & oriented x 3 with fluent speech NEURO: no focal motor/sensory deficits  LABORATORY DATA:  I have reviewed the data as listed  .    Latest Ref Rng & Units 06/23/2023    5:49 AM 06/21/2023    1:32 PM 06/21/2023   11:53 AM  CBC  WBC 4.0 - 10.5 K/uL 8.8   7.6   Hemoglobin 12.0 - 15.0 g/dL 01.0  27.2  53.6   Hematocrit 36.0 - 46.0 % 44.9  45.0  49.2   Platelets 150 - 400 K/uL 65   111     .    Latest Ref Rng & Units 06/23/2023    5:49 AM 06/22/2023    4:52 AM 06/21/2023    1:32 PM  CMP  Glucose 70 - 99 mg/dL 99  92    BUN 8 - 23 mg/dL 23  28    Creatinine 6.44 - 1.00 mg/dL 0.34  7.42    Sodium 595 - 145 mmol/L 141  144  146    Potassium 3.5 - 5.1 mmol/L 4.1  3.6  3.1   Chloride 98 - 111 mmol/L 106  109    CO2 22 - 32 mmol/L 26  29    Calcium 8.9 - 10.3 mg/dL 9.9  63.8    Total Protein 6.5 - 8.1 g/dL 5.7     Total Bilirubin 0.3 - 1.2 mg/dL 3.3     Alkaline Phos 38 - 126 U/L 76     AST 15 - 41 U/L 28     ALT 0 - 44 U/L 17        RADIOGRAPHIC STUDIES: I have personally reviewed the radiological images as listed and agreed with the findings in the report. VAS [place] LOWER EXTREMITY VENOUS (DVT)  Result Date: 06/23/2023  Lower Venous DVT Study Patient Name:  Carrie Guzman  Date of Exam:   06/23/2023 Medical Rec #: [id number]            Accession #:    [id number] Date of Birth: 1946/09/30            Patient Gender: F Patient Age:   77 years Exam Location:  [facility] Procedure:      VAS [place] LOWER EXTREMITY VENOUS (DVT) Referring Phys: Stephania Fragmin --------------------------------------------------------------------------------  Indications: Pulmonary embolism, and chronic leg swelling.  Limitations: Body habitus and poor ultrasound/tissue interface. Comparison Study: No previous study. Performing Technologist: McKayla Maag RVT, [place]  Examination Guidelines: A complete evaluation includes B-mode imaging, spectral Doppler, color Doppler, and power Doppler as needed of all accessible portions of each vessel. Bilateral testing is considered an integral part of a complete examination. Limited examinations for reoccurring indications may be performed as noted. The reflux portion of the exam is performed with the patient in reverse Trendelenburg.  +--------+---------------+---------+-----------+----------+--------------------+ RIGHT   CompressibilityPhasicitySpontaneityPropertiesThrombus Aging       +--------+---------------+---------+-----------+----------+--------------------+ CFV  Body mass index is 46.31 kg/m.  GENERAL:alert, in no acute distress and comfortable SKIN: no acute rashes, no significant lesions EYES: conjunctiva are pink and non-injected, sclera anicteric OROPHARYNX: MMM, no exudates, no oropharyngeal erythema or ulceration NECK: supple, no JVD LYMPH:  no palpable lymphadenopathy in the cervical, axillary or inguinal regions LUNGS: clear to auscultation b/l with normal respiratory effort HEART: regular rate & rhythm ABDOMEN:  normoactive bowel sounds , non tender, not distended. Extremity: Lateral 3+ pedal edema with bilateral induration and erythema suggestive of venous insufficiency with venous stasis dermatitis versus early cellulitis. PSYCH: alert & oriented x 3 with fluent speech NEURO: no focal motor/sensory deficits  LABORATORY DATA:  I have reviewed the data as listed  .    Latest Ref Rng & Units 06/23/2023    5:49 AM 06/21/2023    1:32 PM 06/21/2023   11:53 AM  CBC  WBC 4.0 - 10.5 K/uL 8.8   7.6   Hemoglobin 12.0 - 15.0 g/dL 01.0  27.2  53.6   Hematocrit 36.0 - 46.0 % 44.9  45.0  49.2   Platelets 150 - 400 K/uL 65   111     .    Latest Ref Rng & Units 06/23/2023    5:49 AM 06/22/2023    4:52 AM 06/21/2023    1:32 PM  CMP  Glucose 70 - 99 mg/dL 99  92    BUN 8 - 23 mg/dL 23  28    Creatinine 6.44 - 1.00 mg/dL 0.34  7.42    Sodium 595 - 145 mmol/L 141  144  146    Potassium 3.5 - 5.1 mmol/L 4.1  3.6  3.1   Chloride 98 - 111 mmol/L 106  109    CO2 22 - 32 mmol/L 26  29    Calcium 8.9 - 10.3 mg/dL 9.9  63.8    Total Protein 6.5 - 8.1 g/dL 5.7     Total Bilirubin 0.3 - 1.2 mg/dL 3.3     Alkaline Phos 38 - 126 U/L 76     AST 15 - 41 U/L 28     ALT 0 - 44 U/L 17        RADIOGRAPHIC STUDIES: I have personally reviewed the radiological images as listed and agreed with the findings in the report. VAS [place] LOWER EXTREMITY VENOUS (DVT)  Result Date: 06/23/2023  Lower Venous DVT Study Patient Name:  Carrie Guzman  Date of Exam:   06/23/2023 Medical Rec #: [id number]            Accession #:    [id number] Date of Birth: 1946/09/30            Patient Gender: F Patient Age:   77 years Exam Location:  [facility] Procedure:      VAS [place] LOWER EXTREMITY VENOUS (DVT) Referring Phys: Stephania Fragmin --------------------------------------------------------------------------------  Indications: Pulmonary embolism, and chronic leg swelling.  Limitations: Body habitus and poor ultrasound/tissue interface. Comparison Study: No previous study. Performing Technologist: McKayla Maag RVT, [place]  Examination Guidelines: A complete evaluation includes B-mode imaging, spectral Doppler, color Doppler, and power Doppler as needed of all accessible portions of each vessel. Bilateral testing is considered an integral part of a complete examination. Limited examinations for reoccurring indications may be performed as noted. The reflux portion of the exam is performed with the patient in reverse Trendelenburg.  +--------+---------------+---------+-----------+----------+--------------------+ RIGHT   CompressibilityPhasicitySpontaneityPropertiesThrombus Aging       +--------+---------------+---------+-----------+----------+--------------------+ CFV  Body mass index is 46.31 kg/m.  GENERAL:alert, in no acute distress and comfortable SKIN: no acute rashes, no significant lesions EYES: conjunctiva are pink and non-injected, sclera anicteric OROPHARYNX: MMM, no exudates, no oropharyngeal erythema or ulceration NECK: supple, no JVD LYMPH:  no palpable lymphadenopathy in the cervical, axillary or inguinal regions LUNGS: clear to auscultation b/l with normal respiratory effort HEART: regular rate & rhythm ABDOMEN:  normoactive bowel sounds , non tender, not distended. Extremity: Lateral 3+ pedal edema with bilateral induration and erythema suggestive of venous insufficiency with venous stasis dermatitis versus early cellulitis. PSYCH: alert & oriented x 3 with fluent speech NEURO: no focal motor/sensory deficits  LABORATORY DATA:  I have reviewed the data as listed  .    Latest Ref Rng & Units 06/23/2023    5:49 AM 06/21/2023    1:32 PM 06/21/2023   11:53 AM  CBC  WBC 4.0 - 10.5 K/uL 8.8   7.6   Hemoglobin 12.0 - 15.0 g/dL 01.0  27.2  53.6   Hematocrit 36.0 - 46.0 % 44.9  45.0  49.2   Platelets 150 - 400 K/uL 65   111     .    Latest Ref Rng & Units 06/23/2023    5:49 AM 06/22/2023    4:52 AM 06/21/2023    1:32 PM  CMP  Glucose 70 - 99 mg/dL 99  92    BUN 8 - 23 mg/dL 23  28    Creatinine 6.44 - 1.00 mg/dL 0.34  7.42    Sodium 595 - 145 mmol/L 141  144  146    Potassium 3.5 - 5.1 mmol/L 4.1  3.6  3.1   Chloride 98 - 111 mmol/L 106  109    CO2 22 - 32 mmol/L 26  29    Calcium 8.9 - 10.3 mg/dL 9.9  63.8    Total Protein 6.5 - 8.1 g/dL 5.7     Total Bilirubin 0.3 - 1.2 mg/dL 3.3     Alkaline Phos 38 - 126 U/L 76     AST 15 - 41 U/L 28     ALT 0 - 44 U/L 17        RADIOGRAPHIC STUDIES: I have personally reviewed the radiological images as listed and agreed with the findings in the report. VAS [place] LOWER EXTREMITY VENOUS (DVT)  Result Date: 06/23/2023  Lower Venous DVT Study Patient Name:  Carrie Guzman  Date of Exam:   06/23/2023 Medical Rec #: [id number]            Accession #:    [id number] Date of Birth: 1946/09/30            Patient Gender: F Patient Age:   77 years Exam Location:  [facility] Procedure:      VAS [place] LOWER EXTREMITY VENOUS (DVT) Referring Phys: Stephania Fragmin --------------------------------------------------------------------------------  Indications: Pulmonary embolism, and chronic leg swelling.  Limitations: Body habitus and poor ultrasound/tissue interface. Comparison Study: No previous study. Performing Technologist: McKayla Maag RVT, [place]  Examination Guidelines: A complete evaluation includes B-mode imaging, spectral Doppler, color Doppler, and power Doppler as needed of all accessible portions of each vessel. Bilateral testing is considered an integral part of a complete examination. Limited examinations for reoccurring indications may be performed as noted. The reflux portion of the exam is performed with the patient in reverse Trendelenburg.  +--------+---------------+---------+-----------+----------+--------------------+ RIGHT   CompressibilityPhasicitySpontaneityPropertiesThrombus Aging       +--------+---------------+---------+-----------+----------+--------------------+ CFV  Body mass index is 46.31 kg/m.  GENERAL:alert, in no acute distress and comfortable SKIN: no acute rashes, no significant lesions EYES: conjunctiva are pink and non-injected, sclera anicteric OROPHARYNX: MMM, no exudates, no oropharyngeal erythema or ulceration NECK: supple, no JVD LYMPH:  no palpable lymphadenopathy in the cervical, axillary or inguinal regions LUNGS: clear to auscultation b/l with normal respiratory effort HEART: regular rate & rhythm ABDOMEN:  normoactive bowel sounds , non tender, not distended. Extremity: Lateral 3+ pedal edema with bilateral induration and erythema suggestive of venous insufficiency with venous stasis dermatitis versus early cellulitis. PSYCH: alert & oriented x 3 with fluent speech NEURO: no focal motor/sensory deficits  LABORATORY DATA:  I have reviewed the data as listed  .    Latest Ref Rng & Units 06/23/2023    5:49 AM 06/21/2023    1:32 PM 06/21/2023   11:53 AM  CBC  WBC 4.0 - 10.5 K/uL 8.8   7.6   Hemoglobin 12.0 - 15.0 g/dL 01.0  27.2  53.6   Hematocrit 36.0 - 46.0 % 44.9  45.0  49.2   Platelets 150 - 400 K/uL 65   111     .    Latest Ref Rng & Units 06/23/2023    5:49 AM 06/22/2023    4:52 AM 06/21/2023    1:32 PM  CMP  Glucose 70 - 99 mg/dL 99  92    BUN 8 - 23 mg/dL 23  28    Creatinine 6.44 - 1.00 mg/dL 0.34  7.42    Sodium 595 - 145 mmol/L 141  144  146    Potassium 3.5 - 5.1 mmol/L 4.1  3.6  3.1   Chloride 98 - 111 mmol/L 106  109    CO2 22 - 32 mmol/L 26  29    Calcium 8.9 - 10.3 mg/dL 9.9  63.8    Total Protein 6.5 - 8.1 g/dL 5.7     Total Bilirubin 0.3 - 1.2 mg/dL 3.3     Alkaline Phos 38 - 126 U/L 76     AST 15 - 41 U/L 28     ALT 0 - 44 U/L 17        RADIOGRAPHIC STUDIES: I have personally reviewed the radiological images as listed and agreed with the findings in the report. VAS [place] LOWER EXTREMITY VENOUS (DVT)  Result Date: 06/23/2023  Lower Venous DVT Study Patient Name:  Carrie Guzman  Date of Exam:   06/23/2023 Medical Rec #: [id number]            Accession #:    [id number] Date of Birth: 1946/09/30            Patient Gender: F Patient Age:   77 years Exam Location:  [facility] Procedure:      VAS [place] LOWER EXTREMITY VENOUS (DVT) Referring Phys: Stephania Fragmin --------------------------------------------------------------------------------  Indications: Pulmonary embolism, and chronic leg swelling.  Limitations: Body habitus and poor ultrasound/tissue interface. Comparison Study: No previous study. Performing Technologist: McKayla Maag RVT, [place]  Examination Guidelines: A complete evaluation includes B-mode imaging, spectral Doppler, color Doppler, and power Doppler as needed of all accessible portions of each vessel. Bilateral testing is considered an integral part of a complete examination. Limited examinations for reoccurring indications may be performed as noted. The reflux portion of the exam is performed with the patient in reverse Trendelenburg.  +--------+---------------+---------+-----------+----------+--------------------+ RIGHT   CompressibilityPhasicitySpontaneityPropertiesThrombus Aging       +--------+---------------+---------+-----------+----------+--------------------+ CFV  Body mass index is 46.31 kg/m.  GENERAL:alert, in no acute distress and comfortable SKIN: no acute rashes, no significant lesions EYES: conjunctiva are pink and non-injected, sclera anicteric OROPHARYNX: MMM, no exudates, no oropharyngeal erythema or ulceration NECK: supple, no JVD LYMPH:  no palpable lymphadenopathy in the cervical, axillary or inguinal regions LUNGS: clear to auscultation b/l with normal respiratory effort HEART: regular rate & rhythm ABDOMEN:  normoactive bowel sounds , non tender, not distended. Extremity: Lateral 3+ pedal edema with bilateral induration and erythema suggestive of venous insufficiency with venous stasis dermatitis versus early cellulitis. PSYCH: alert & oriented x 3 with fluent speech NEURO: no focal motor/sensory deficits  LABORATORY DATA:  I have reviewed the data as listed  .    Latest Ref Rng & Units 06/23/2023    5:49 AM 06/21/2023    1:32 PM 06/21/2023   11:53 AM  CBC  WBC 4.0 - 10.5 K/uL 8.8   7.6   Hemoglobin 12.0 - 15.0 g/dL 01.0  27.2  53.6   Hematocrit 36.0 - 46.0 % 44.9  45.0  49.2   Platelets 150 - 400 K/uL 65   111     .    Latest Ref Rng & Units 06/23/2023    5:49 AM 06/22/2023    4:52 AM 06/21/2023    1:32 PM  CMP  Glucose 70 - 99 mg/dL 99  92    BUN 8 - 23 mg/dL 23  28    Creatinine 6.44 - 1.00 mg/dL 0.34  7.42    Sodium 595 - 145 mmol/L 141  144  146    Potassium 3.5 - 5.1 mmol/L 4.1  3.6  3.1   Chloride 98 - 111 mmol/L 106  109    CO2 22 - 32 mmol/L 26  29    Calcium 8.9 - 10.3 mg/dL 9.9  63.8    Total Protein 6.5 - 8.1 g/dL 5.7     Total Bilirubin 0.3 - 1.2 mg/dL 3.3     Alkaline Phos 38 - 126 U/L 76     AST 15 - 41 U/L 28     ALT 0 - 44 U/L 17        RADIOGRAPHIC STUDIES: I have personally reviewed the radiological images as listed and agreed with the findings in the report. VAS [place] LOWER EXTREMITY VENOUS (DVT)  Result Date: 06/23/2023  Lower Venous DVT Study Patient Name:  Carrie Guzman  Date of Exam:   06/23/2023 Medical Rec #: [id number]            Accession #:    [id number] Date of Birth: 1946/09/30            Patient Gender: F Patient Age:   77 years Exam Location:  [facility] Procedure:      VAS [place] LOWER EXTREMITY VENOUS (DVT) Referring Phys: Stephania Fragmin --------------------------------------------------------------------------------  Indications: Pulmonary embolism, and chronic leg swelling.  Limitations: Body habitus and poor ultrasound/tissue interface. Comparison Study: No previous study. Performing Technologist: McKayla Maag RVT, [place]  Examination Guidelines: A complete evaluation includes B-mode imaging, spectral Doppler, color Doppler, and power Doppler as needed of all accessible portions of each vessel. Bilateral testing is considered an integral part of a complete examination. Limited examinations for reoccurring indications may be performed as noted. The reflux portion of the exam is performed with the patient in reverse Trendelenburg.  +--------+---------------+---------+-----------+----------+--------------------+ RIGHT   CompressibilityPhasicitySpontaneityPropertiesThrombus Aging       +--------+---------------+---------+-----------+----------+--------------------+ CFV  Body mass index is 46.31 kg/m.  GENERAL:alert, in no acute distress and comfortable SKIN: no acute rashes, no significant lesions EYES: conjunctiva are pink and non-injected, sclera anicteric OROPHARYNX: MMM, no exudates, no oropharyngeal erythema or ulceration NECK: supple, no JVD LYMPH:  no palpable lymphadenopathy in the cervical, axillary or inguinal regions LUNGS: clear to auscultation b/l with normal respiratory effort HEART: regular rate & rhythm ABDOMEN:  normoactive bowel sounds , non tender, not distended. Extremity: Lateral 3+ pedal edema with bilateral induration and erythema suggestive of venous insufficiency with venous stasis dermatitis versus early cellulitis. PSYCH: alert & oriented x 3 with fluent speech NEURO: no focal motor/sensory deficits  LABORATORY DATA:  I have reviewed the data as listed  .    Latest Ref Rng & Units 06/23/2023    5:49 AM 06/21/2023    1:32 PM 06/21/2023   11:53 AM  CBC  WBC 4.0 - 10.5 K/uL 8.8   7.6   Hemoglobin 12.0 - 15.0 g/dL 01.0  27.2  53.6   Hematocrit 36.0 - 46.0 % 44.9  45.0  49.2   Platelets 150 - 400 K/uL 65   111     .    Latest Ref Rng & Units 06/23/2023    5:49 AM 06/22/2023    4:52 AM 06/21/2023    1:32 PM  CMP  Glucose 70 - 99 mg/dL 99  92    BUN 8 - 23 mg/dL 23  28    Creatinine 6.44 - 1.00 mg/dL 0.34  7.42    Sodium 595 - 145 mmol/L 141  144  146    Potassium 3.5 - 5.1 mmol/L 4.1  3.6  3.1   Chloride 98 - 111 mmol/L 106  109    CO2 22 - 32 mmol/L 26  29    Calcium 8.9 - 10.3 mg/dL 9.9  63.8    Total Protein 6.5 - 8.1 g/dL 5.7     Total Bilirubin 0.3 - 1.2 mg/dL 3.3     Alkaline Phos 38 - 126 U/L 76     AST 15 - 41 U/L 28     ALT 0 - 44 U/L 17        RADIOGRAPHIC STUDIES: I have personally reviewed the radiological images as listed and agreed with the findings in the report. VAS [place] LOWER EXTREMITY VENOUS (DVT)  Result Date: 06/23/2023  Lower Venous DVT Study Patient Name:  Carrie Guzman  Date of Exam:   06/23/2023 Medical Rec #: [id number]            Accession #:    [id number] Date of Birth: 1946/09/30            Patient Gender: F Patient Age:   77 years Exam Location:  [facility] Procedure:      VAS [place] LOWER EXTREMITY VENOUS (DVT) Referring Phys: Stephania Fragmin --------------------------------------------------------------------------------  Indications: Pulmonary embolism, and chronic leg swelling.  Limitations: Body habitus and poor ultrasound/tissue interface. Comparison Study: No previous study. Performing Technologist: McKayla Maag RVT, [place]  Examination Guidelines: A complete evaluation includes B-mode imaging, spectral Doppler, color Doppler, and power Doppler as needed of all accessible portions of each vessel. Bilateral testing is considered an integral part of a complete examination. Limited examinations for reoccurring indications may be performed as noted. The reflux portion of the exam is performed with the patient in reverse Trendelenburg.  +--------+---------------+---------+-----------+----------+--------------------+ RIGHT   CompressibilityPhasicitySpontaneityPropertiesThrombus Aging       +--------+---------------+---------+-----------+----------+--------------------+ CFV  Body mass index is 46.31 kg/m.  GENERAL:alert, in no acute distress and comfortable SKIN: no acute rashes, no significant lesions EYES: conjunctiva are pink and non-injected, sclera anicteric OROPHARYNX: MMM, no exudates, no oropharyngeal erythema or ulceration NECK: supple, no JVD LYMPH:  no palpable lymphadenopathy in the cervical, axillary or inguinal regions LUNGS: clear to auscultation b/l with normal respiratory effort HEART: regular rate & rhythm ABDOMEN:  normoactive bowel sounds , non tender, not distended. Extremity: Lateral 3+ pedal edema with bilateral induration and erythema suggestive of venous insufficiency with venous stasis dermatitis versus early cellulitis. PSYCH: alert & oriented x 3 with fluent speech NEURO: no focal motor/sensory deficits  LABORATORY DATA:  I have reviewed the data as listed  .    Latest Ref Rng & Units 06/23/2023    5:49 AM 06/21/2023    1:32 PM 06/21/2023   11:53 AM  CBC  WBC 4.0 - 10.5 K/uL 8.8   7.6   Hemoglobin 12.0 - 15.0 g/dL 01.0  27.2  53.6   Hematocrit 36.0 - 46.0 % 44.9  45.0  49.2   Platelets 150 - 400 K/uL 65   111     .    Latest Ref Rng & Units 06/23/2023    5:49 AM 06/22/2023    4:52 AM 06/21/2023    1:32 PM  CMP  Glucose 70 - 99 mg/dL 99  92    BUN 8 - 23 mg/dL 23  28    Creatinine 6.44 - 1.00 mg/dL 0.34  7.42    Sodium 595 - 145 mmol/L 141  144  146    Potassium 3.5 - 5.1 mmol/L 4.1  3.6  3.1   Chloride 98 - 111 mmol/L 106  109    CO2 22 - 32 mmol/L 26  29    Calcium 8.9 - 10.3 mg/dL 9.9  63.8    Total Protein 6.5 - 8.1 g/dL 5.7     Total Bilirubin 0.3 - 1.2 mg/dL 3.3     Alkaline Phos 38 - 126 U/L 76     AST 15 - 41 U/L 28     ALT 0 - 44 U/L 17        RADIOGRAPHIC STUDIES: I have personally reviewed the radiological images as listed and agreed with the findings in the report. VAS [place] LOWER EXTREMITY VENOUS (DVT)  Result Date: 06/23/2023  Lower Venous DVT Study Patient Name:  Carrie Guzman  Date of Exam:   06/23/2023 Medical Rec #: [id number]            Accession #:    [id number] Date of Birth: 1946/09/30            Patient Gender: F Patient Age:   77 years Exam Location:  [facility] Procedure:      VAS [place] LOWER EXTREMITY VENOUS (DVT) Referring Phys: Stephania Fragmin --------------------------------------------------------------------------------  Indications: Pulmonary embolism, and chronic leg swelling.  Limitations: Body habitus and poor ultrasound/tissue interface. Comparison Study: No previous study. Performing Technologist: McKayla Maag RVT, [place]  Examination Guidelines: A complete evaluation includes B-mode imaging, spectral Doppler, color Doppler, and power Doppler as needed of all accessible portions of each vessel. Bilateral testing is considered an integral part of a complete examination. Limited examinations for reoccurring indications may be performed as noted. The reflux portion of the exam is performed with the patient in reverse Trendelenburg.  +--------+---------------+---------+-----------+----------+--------------------+ RIGHT   CompressibilityPhasicitySpontaneityPropertiesThrombus Aging       +--------+---------------+---------+-----------+----------+--------------------+ CFV  confirms the above findings. IMPRESSION: 1. Right upper lobe segmental and subsegmental pulmonary emboli. There is evidence of right heart strain. 2. Cardiomegaly with large pericardial effusion. 3. Moderate right pleural effusion. 4. Patchy airspace disease bilaterally with consolidation in the right lower lobe, possible atelectasis or infiltrate. 5. Aortic atherosclerosis and coronary artery calcifications. 6. Complex hyperdense lesion in the mid left kidney. Nonemergent ultrasound is recommended for further characterization. 7. Partial visualization of herniated small bowel in the right upper quadrant. Electronically Signed: By: Thornell Sartorius M.D. On: 06/22/2023 20:14   ECHOCARDIOGRAM COMPLETE  Result Date: 06/22/2023    ECHOCARDIOGRAM REPORT   Patient Name:   Carrie Guzman Date of Exam: 06/22/2023 Medical Rec #:  [id number]           Height:       64.0 in Accession #:    [id number]          Weight:       272.9 lb Date of Birth:  01/19/1946           BSA:          2.233 m Patient Age:    76 years            BP:           100/64 mmHg Patient Gender: F                   HR:           58 bpm. Exam Location:  Inpatient Procedure: 2D Echo, 3D Echo, Cardiac Doppler and Color Doppler Indications:    I50.40* Unspecified combined systolic (congestive) and diastolic                 (congestive) heart failure  History:        Patient has no prior history of Echocardiogram examinations.                 CHF, Signs/Symptoms:Edema,  Shortness of Breath and Dyspnea; Risk                 Factors:Hypertension.  Sonographer:    Sheralyn Boatman RDCS Referring Phys: [id number] MIR M [facility]  Sonographer Comments: Patient is obese and suboptimal subcostal window. Image acquisition challenging due to patient body habitus. IMPRESSIONS  1. Left ventricular ejection fraction, by estimation, is 60 to 65%. The left ventricle has normal function. The left ventricle has no regional wall motion abnormalities. Left ventricular diastolic parameters are consistent with Grade I diastolic dysfunction (impaired relaxation). There is abnormal (paradoxical) septal motion, consistent with right ventricular volume overload and the interventricular septum is flattened in systole and diastole, consistent with right ventricular pressure and volume overload.  2. Right ventricular systolic function is severely reduced. The right ventricular size is severely enlarged. There is severely elevated pulmonary artery systolic pressure. The estimated right ventricular systolic pressure is 94.2 mmHg.  3. Left atrial size was moderately dilated.  4. Right atrial size was severely dilated.  5. Moderate pericardial effusion. The pericardial effusion is posterior to the left ventricle and localized near the right atrium. There is no evidence of cardiac tamponade.  6. The mitral valve is abnormal. Trivial mitral valve regurgitation. No evidence of mitral stenosis. 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  7. The aortic valve is tricuspid. There is moderate calcification of the aortic valve. Aortic valve regurgitation is not visualized. Aortic valve sclerosis/calcification is present, without any evidence of aortic stenosis.  confirms the above findings. IMPRESSION: 1. Right upper lobe segmental and subsegmental pulmonary emboli. There is evidence of right heart strain. 2. Cardiomegaly with large pericardial effusion. 3. Moderate right pleural effusion. 4. Patchy airspace disease bilaterally with consolidation in the right lower lobe, possible atelectasis or infiltrate. 5. Aortic atherosclerosis and coronary artery calcifications. 6. Complex hyperdense lesion in the mid left kidney. Nonemergent ultrasound is recommended for further characterization. 7. Partial visualization of herniated small bowel in the right upper quadrant. Electronically Signed: By: Thornell Sartorius M.D. On: 06/22/2023 20:14   ECHOCARDIOGRAM COMPLETE  Result Date: 06/22/2023    ECHOCARDIOGRAM REPORT   Patient Name:   Carrie Guzman Date of Exam: 06/22/2023 Medical Rec #:  [id number]           Height:       64.0 in Accession #:    [id number]          Weight:       272.9 lb Date of Birth:  01/19/1946           BSA:          2.233 m Patient Age:    76 years            BP:           100/64 mmHg Patient Gender: F                   HR:           58 bpm. Exam Location:  Inpatient Procedure: 2D Echo, 3D Echo, Cardiac Doppler and Color Doppler Indications:    I50.40* Unspecified combined systolic (congestive) and diastolic                 (congestive) heart failure  History:        Patient has no prior history of Echocardiogram examinations.                 CHF, Signs/Symptoms:Edema,  Shortness of Breath and Dyspnea; Risk                 Factors:Hypertension.  Sonographer:    Sheralyn Boatman RDCS Referring Phys: [id number] MIR M [facility]  Sonographer Comments: Patient is obese and suboptimal subcostal window. Image acquisition challenging due to patient body habitus. IMPRESSIONS  1. Left ventricular ejection fraction, by estimation, is 60 to 65%. The left ventricle has normal function. The left ventricle has no regional wall motion abnormalities. Left ventricular diastolic parameters are consistent with Grade I diastolic dysfunction (impaired relaxation). There is abnormal (paradoxical) septal motion, consistent with right ventricular volume overload and the interventricular septum is flattened in systole and diastole, consistent with right ventricular pressure and volume overload.  2. Right ventricular systolic function is severely reduced. The right ventricular size is severely enlarged. There is severely elevated pulmonary artery systolic pressure. The estimated right ventricular systolic pressure is 94.2 mmHg.  3. Left atrial size was moderately dilated.  4. Right atrial size was severely dilated.  5. Moderate pericardial effusion. The pericardial effusion is posterior to the left ventricle and localized near the right atrium. There is no evidence of cardiac tamponade.  6. The mitral valve is abnormal. Trivial mitral valve regurgitation. No evidence of mitral stenosis. 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  7. The aortic valve is tricuspid. There is moderate calcification of the aortic valve. Aortic valve regurgitation is not visualized. Aortic valve sclerosis/calcification is present, without any evidence of aortic stenosis.

## 2023-06-23 NOTE — Progress Notes (Signed)
Pt placed on overnight pulse ox. RN and pt aware not to remove the pulse ox. Pt is on 3L [place] at this time.

## 2023-06-23 NOTE — TOC Progression Note (Signed)
Transition of Care [facility]) - Progression Note    Patient Details  Name: Carrie Guzman MRN: [id number] Date of Birth: 1946-06-21  [facility] CM/SW Contact  Phiona Ramnauth, Olegario Messier, RN Phone Number: 06/23/2023, 3:05 PM  Clinical Narrative: Patient/spouse has no preference. [facility] HHPT rep Cheryl;[employer] rep Jermaine rw to deliver to rm prior d/c.Has own transport home.     Expected Discharge Plan: Home w Home Health Services Barriers to Discharge: Continued Medical Work up  Expected Discharge Plan and Services   Discharge Planning Services: CM Consult   Living arrangements for the past 2 months: Single Family Home                 DME Arranged: Walker rolling DME Agency: [employer] Date DME Agency Contacted: 06/23/23 Time DME Agency Contacted: 1504 Representative spoke with at DME Agency: Vaughan Basta HH Arranged: PT HH Agency: [facility] Date [facility] Agency Contacted: 06/23/23 Time HH Agency Contacted: 1504 Representative spoke with at [facility] Agency: Elnita Maxwell   Social Determinants of Health (SDOH) Interventions SDOH Screenings   Food Insecurity: No Food Insecurity (06/21/2023)  Housing: Low Risk  (06/21/2023)  Transportation Needs: No Transportation Needs (06/21/2023)  Utilities: Not At Risk (06/21/2023)  Tobacco Use: Low Risk  (06/21/2023)  Recent Concern: Tobacco Use - Medium Risk (06/07/2023)   Received from [facility]    Readmission Risk Interventions     No data to display

## 2023-06-24 DIAGNOSIS — I5031 Acute diastolic (congestive) heart failure: Secondary | ICD-10-CM | POA: Diagnosis not present

## 2023-06-24 DIAGNOSIS — I50811 Acute right heart failure: Secondary | ICD-10-CM | POA: Diagnosis not present

## 2023-06-24 LAB — CBC
HCT: 46.1 % — ABNORMAL HIGH (ref 36.0–46.0)
Hemoglobin: 14.2 g/dL (ref 12.0–15.0)
MCH: 32.6 pg (ref 26.0–34.0)
MCHC: 30.8 g/dL (ref 30.0–36.0)
MCV: 105.7 fL — ABNORMAL HIGH (ref 80.0–100.0)
Platelets: 66 10*3/uL — ABNORMAL LOW (ref 150–400)
RBC: 4.36 MIL/uL (ref 3.87–5.11)
RDW: 17.6 % — ABNORMAL HIGH (ref 11.5–15.5)
WBC: 6.5 10*3/uL (ref 4.0–10.5)
nRBC: 0 % (ref 0.0–0.2)

## 2023-06-24 LAB — COMPREHENSIVE METABOLIC PANEL
ALT: 14 U/L (ref 0–44)
AST: 17 U/L (ref 15–41)
Albumin: 2.7 g/dL — ABNORMAL LOW (ref 3.5–5.0)
Alkaline Phosphatase: 79 U/L (ref 38–126)
Anion gap: 7 (ref 5–15)
BUN: 23 mg/dL (ref 8–23)
CO2: 27 mmol/L (ref 22–32)
Calcium: 9.6 mg/dL (ref 8.9–10.3)
Chloride: 109 mmol/L (ref 98–111)
Creatinine, Ser: 0.61 mg/dL (ref 0.44–1.00)
GFR, Estimated: >60 mL/min (ref >60)
Glucose, Bld: 112 mg/dL — ABNORMAL HIGH (ref 70–99)
Potassium: 3.4 mmol/L — ABNORMAL LOW (ref 3.5–5.1)
Sodium: 143 mmol/L (ref 135–145)
Total Bilirubin: 3.1 mg/dL — ABNORMAL HIGH (ref 0.3–1.2)
Total Protein: 5.7 g/dL — ABNORMAL LOW (ref 6.5–8.1)

## 2023-06-24 LAB — HEPARIN LEVEL (UNFRACTIONATED): Heparin Unfractionated: 0.51 [IU]/mL (ref 0.30–0.70)

## 2023-06-24 LAB — GLUCOSE, CAPILLARY: Glucose-Capillary: 111 mg/dL — ABNORMAL HIGH (ref 70–99)

## 2023-06-24 LAB — RHEUMATOID FACTOR: Rheumatoid fact SerPl-aCnc: 10.4 [IU]/mL (ref <14.0)

## 2023-06-24 LAB — MAGNESIUM: Magnesium: 2.1 mg/dL (ref 1.7–2.4)

## 2023-06-24 LAB — HIGH SENSITIVITY CRP: CRP, High Sensitivity: 2.03 mg/L (ref 0.00–3.00)

## 2023-06-24 MED ORDER — POTASSIUM CHLORIDE CRYS ER 20 MEQ PO TBCR
40.0000 meq | EXTENDED_RELEASE_TABLET | Freq: Once | ORAL | Status: AC
  Administered 2023-06-24: 40 meq via ORAL
  Filled 2023-06-24: qty 2

## 2023-06-24 MED ORDER — FUROSEMIDE 10 MG/ML IJ SOLN
40.0000 mg | Freq: Once | INTRAMUSCULAR | Status: AC
  Administered 2023-06-24: 40 mg via INTRAVENOUS
  Filled 2023-06-24: qty 4

## 2023-06-24 NOTE — Progress Notes (Signed)
to evaluate the vascular anatomy. RADIATION DOSE REDUCTION: This exam was performed according to the departmental dose-optimization program which includes automated exposure control, adjustment of the mA and/or kV according to patient size and/or use of iterative reconstruction technique. CONTRAST:  75mL OMNIPAQUE IOHEXOL 350 MG/ML SOLN COMPARISON:  None Available. FINDINGS: Cardiovascular: Heart is enlarged and there is a moderate-to-large pericardial effusion. Few scattered coronary artery calcifications are noted. There is atherosclerotic calcification of the  aorta without evidence of aneurysm. The pulmonary trunk is distended suggesting underlying pulmonary artery hypertension. No definite evidence of pulmonary embolism. Segmental and subsegmental pulmonary emboli are noted in the right upper lobe. The right ventricle is markedly distended, concerning for right heart strain. Mediastinum/Nodes: No mediastinal, hilar, or axillary lymphadenopathy. There is a 2.5 cm enhancing nodule in the left lobe of the thyroid gland. The trachea and esophagus are within normal limits. Lungs/Pleura: There is a moderate right pleural effusion with compressive atelectasis or infiltrate. Patchy atelectasis or infiltrate is present in the lungs bilaterally. No pneumothorax. Upper Abdomen: The gallbladder is surgically absent. A 1.1 cm exophytic nodule is noted in the mid left kidney with indeterminate imaging characteristics. Mild anasarca is noted. There is partial visualization of herniation of small bowel in the subcutaneous tissues in the right upper quadrant. Musculoskeletal: Degenerative changes are present in the thoracic spine. No acute fracture. Review of the MIP images confirms the above findings. IMPRESSION: 1. Right upper lobe segmental and subsegmental pulmonary emboli. There is evidence of right heart strain. 2. Cardiomegaly with large pericardial effusion. 3. Moderate right pleural effusion. 4. Patchy airspace disease bilaterally with consolidation in the right lower lobe, possible atelectasis or infiltrate. 5. Aortic atherosclerosis and coronary artery calcifications. 6. Complex hyperdense lesion in the mid left kidney. Nonemergent ultrasound is recommended for further characterization. 7. Partial visualization of herniated small bowel in the right upper quadrant. Electronically Signed: By: Thornell Sartorius M.D. On: 06/22/2023 20:14   ECHOCARDIOGRAM COMPLETE  Result Date: 06/22/2023    ECHOCARDIOGRAM REPORT   Patient Name:   Carrie Guzman Schaper Date of Exam: 06/22/2023  Medical Rec #:  [id number]           Height:       64.0 in Accession #:    [id number]          Weight:       272.9 lb Date of Birth:  [birth year]           BSA:          2.233 m Patient Age:    76 years            BP:           100/64 mmHg Patient Gender: F                   HR:           58 bpm. Exam Location:  Inpatient Procedure: 2D Echo, 3D Echo, Cardiac Doppler and Color Doppler Indications:    I50.40* Unspecified combined systolic (congestive) and diastolic                 (congestive) heart failure  History:        Patient has no prior history of Echocardiogram examinations.                 CHF, Signs/Symptoms:Edema, Shortness of Breath and Dyspnea; Risk                 Factors:Hypertension.  to evaluate the vascular anatomy. RADIATION DOSE REDUCTION: This exam was performed according to the departmental dose-optimization program which includes automated exposure control, adjustment of the mA and/or kV according to patient size and/or use of iterative reconstruction technique. CONTRAST:  75mL OMNIPAQUE IOHEXOL 350 MG/ML SOLN COMPARISON:  None Available. FINDINGS: Cardiovascular: Heart is enlarged and there is a moderate-to-large pericardial effusion. Few scattered coronary artery calcifications are noted. There is atherosclerotic calcification of the  aorta without evidence of aneurysm. The pulmonary trunk is distended suggesting underlying pulmonary artery hypertension. No definite evidence of pulmonary embolism. Segmental and subsegmental pulmonary emboli are noted in the right upper lobe. The right ventricle is markedly distended, concerning for right heart strain. Mediastinum/Nodes: No mediastinal, hilar, or axillary lymphadenopathy. There is a 2.5 cm enhancing nodule in the left lobe of the thyroid gland. The trachea and esophagus are within normal limits. Lungs/Pleura: There is a moderate right pleural effusion with compressive atelectasis or infiltrate. Patchy atelectasis or infiltrate is present in the lungs bilaterally. No pneumothorax. Upper Abdomen: The gallbladder is surgically absent. A 1.1 cm exophytic nodule is noted in the mid left kidney with indeterminate imaging characteristics. Mild anasarca is noted. There is partial visualization of herniation of small bowel in the subcutaneous tissues in the right upper quadrant. Musculoskeletal: Degenerative changes are present in the thoracic spine. No acute fracture. Review of the MIP images confirms the above findings. IMPRESSION: 1. Right upper lobe segmental and subsegmental pulmonary emboli. There is evidence of right heart strain. 2. Cardiomegaly with large pericardial effusion. 3. Moderate right pleural effusion. 4. Patchy airspace disease bilaterally with consolidation in the right lower lobe, possible atelectasis or infiltrate. 5. Aortic atherosclerosis and coronary artery calcifications. 6. Complex hyperdense lesion in the mid left kidney. Nonemergent ultrasound is recommended for further characterization. 7. Partial visualization of herniated small bowel in the right upper quadrant. Electronically Signed: By: Thornell Sartorius M.D. On: 06/22/2023 20:14   ECHOCARDIOGRAM COMPLETE  Result Date: 06/22/2023    ECHOCARDIOGRAM REPORT   Patient Name:   Carrie Guzman Schaper Date of Exam: 06/22/2023  Medical Rec #:  [id number]           Height:       64.0 in Accession #:    [id number]          Weight:       272.9 lb Date of Birth:  [birth year]           BSA:          2.233 m Patient Age:    76 years            BP:           100/64 mmHg Patient Gender: F                   HR:           58 bpm. Exam Location:  Inpatient Procedure: 2D Echo, 3D Echo, Cardiac Doppler and Color Doppler Indications:    I50.40* Unspecified combined systolic (congestive) and diastolic                 (congestive) heart failure  History:        Patient has no prior history of Echocardiogram examinations.                 CHF, Signs/Symptoms:Edema, Shortness of Breath and Dyspnea; Risk                 Factors:Hypertension.  100.8*  --  102.0* 105.7*  MCH 32.6  --  32.5 32.6  MCHC 32.3  --  31.8 30.8  RDW 18.4*  --  17.9* 17.6*  PLT 111*  --  65* 66*   Thyroid  Recent Labs  Lab 06/21/23 1852  TSH 0.252*  FREET4 1.57*    BNP Recent Labs  Lab 06/21/23 1203  BNP 1,771.2*    DDimer No results for input(s): "DDIMER" in the last 168 hours.   Radiology    VAS [place] LOWER EXTREMITY VENOUS (DVT)  Result Date: 06/23/2023  Lower Venous DVT Study Patient Name:  AKEIA HAWKEY Mcintire  Date of Exam:   06/23/2023 Medical Rec #: [id number]            Accession #:    [id number] Date of Birth: 1946-07-23            Patient Gender: F Patient Age:   59 years Exam Location:  [facility] Procedure:      VAS [place] LOWER EXTREMITY VENOUS (DVT) Referring Phys: Stephania Fragmin --------------------------------------------------------------------------------  Indications: Pulmonary embolism, and chronic leg swelling.  Limitations: Body habitus and poor ultrasound/tissue interface. Comparison Study: No previous study. Performing Technologist: McKayla Maag RVT, [place]  Examination Guidelines: A complete evaluation includes B-mode imaging, spectral Doppler, color Doppler, and power Doppler as needed of all accessible portions of each vessel.  Bilateral testing is considered an integral part of a complete examination. Limited examinations for reoccurring indications may be performed as noted. The reflux portion of the exam is performed with the patient in reverse Trendelenburg.  +--------+---------------+---------+-----------+----------+--------------------+ RIGHT   CompressibilityPhasicitySpontaneityPropertiesThrombus Aging       +--------+---------------+---------+-----------+----------+--------------------+ CFV     Full           Yes      Yes                                       +--------+---------------+---------+-----------+----------+--------------------+ SFJ     Full                                                              +--------+---------------+---------+-----------+----------+--------------------+ FV Prox Full                                                              +--------+---------------+---------+-----------+----------+--------------------+ FV Mid                 Yes      Yes                  Patent by color                                                           doppler              +--------+---------------+---------+-----------+----------+--------------------+ FV  100.8*  --  102.0* 105.7*  MCH 32.6  --  32.5 32.6  MCHC 32.3  --  31.8 30.8  RDW 18.4*  --  17.9* 17.6*  PLT 111*  --  65* 66*   Thyroid  Recent Labs  Lab 06/21/23 1852  TSH 0.252*  FREET4 1.57*    BNP Recent Labs  Lab 06/21/23 1203  BNP 1,771.2*    DDimer No results for input(s): "DDIMER" in the last 168 hours.   Radiology    VAS [place] LOWER EXTREMITY VENOUS (DVT)  Result Date: 06/23/2023  Lower Venous DVT Study Patient Name:  AKEIA HAWKEY Mcintire  Date of Exam:   06/23/2023 Medical Rec #: [id number]            Accession #:    [id number] Date of Birth: 1946-07-23            Patient Gender: F Patient Age:   59 years Exam Location:  [facility] Procedure:      VAS [place] LOWER EXTREMITY VENOUS (DVT) Referring Phys: Stephania Fragmin --------------------------------------------------------------------------------  Indications: Pulmonary embolism, and chronic leg swelling.  Limitations: Body habitus and poor ultrasound/tissue interface. Comparison Study: No previous study. Performing Technologist: McKayla Maag RVT, [place]  Examination Guidelines: A complete evaluation includes B-mode imaging, spectral Doppler, color Doppler, and power Doppler as needed of all accessible portions of each vessel.  Bilateral testing is considered an integral part of a complete examination. Limited examinations for reoccurring indications may be performed as noted. The reflux portion of the exam is performed with the patient in reverse Trendelenburg.  +--------+---------------+---------+-----------+----------+--------------------+ RIGHT   CompressibilityPhasicitySpontaneityPropertiesThrombus Aging       +--------+---------------+---------+-----------+----------+--------------------+ CFV     Full           Yes      Yes                                       +--------+---------------+---------+-----------+----------+--------------------+ SFJ     Full                                                              +--------+---------------+---------+-----------+----------+--------------------+ FV Prox Full                                                              +--------+---------------+---------+-----------+----------+--------------------+ FV Mid                 Yes      Yes                  Patent by color                                                           doppler              +--------+---------------+---------+-----------+----------+--------------------+ FV  to evaluate the vascular anatomy. RADIATION DOSE REDUCTION: This exam was performed according to the departmental dose-optimization program which includes automated exposure control, adjustment of the mA and/or kV according to patient size and/or use of iterative reconstruction technique. CONTRAST:  75mL OMNIPAQUE IOHEXOL 350 MG/ML SOLN COMPARISON:  None Available. FINDINGS: Cardiovascular: Heart is enlarged and there is a moderate-to-large pericardial effusion. Few scattered coronary artery calcifications are noted. There is atherosclerotic calcification of the  aorta without evidence of aneurysm. The pulmonary trunk is distended suggesting underlying pulmonary artery hypertension. No definite evidence of pulmonary embolism. Segmental and subsegmental pulmonary emboli are noted in the right upper lobe. The right ventricle is markedly distended, concerning for right heart strain. Mediastinum/Nodes: No mediastinal, hilar, or axillary lymphadenopathy. There is a 2.5 cm enhancing nodule in the left lobe of the thyroid gland. The trachea and esophagus are within normal limits. Lungs/Pleura: There is a moderate right pleural effusion with compressive atelectasis or infiltrate. Patchy atelectasis or infiltrate is present in the lungs bilaterally. No pneumothorax. Upper Abdomen: The gallbladder is surgically absent. A 1.1 cm exophytic nodule is noted in the mid left kidney with indeterminate imaging characteristics. Mild anasarca is noted. There is partial visualization of herniation of small bowel in the subcutaneous tissues in the right upper quadrant. Musculoskeletal: Degenerative changes are present in the thoracic spine. No acute fracture. Review of the MIP images confirms the above findings. IMPRESSION: 1. Right upper lobe segmental and subsegmental pulmonary emboli. There is evidence of right heart strain. 2. Cardiomegaly with large pericardial effusion. 3. Moderate right pleural effusion. 4. Patchy airspace disease bilaterally with consolidation in the right lower lobe, possible atelectasis or infiltrate. 5. Aortic atherosclerosis and coronary artery calcifications. 6. Complex hyperdense lesion in the mid left kidney. Nonemergent ultrasound is recommended for further characterization. 7. Partial visualization of herniated small bowel in the right upper quadrant. Electronically Signed: By: Thornell Sartorius M.D. On: 06/22/2023 20:14   ECHOCARDIOGRAM COMPLETE  Result Date: 06/22/2023    ECHOCARDIOGRAM REPORT   Patient Name:   Carrie Guzman Schaper Date of Exam: 06/22/2023  Medical Rec #:  [id number]           Height:       64.0 in Accession #:    [id number]          Weight:       272.9 lb Date of Birth:  [birth year]           BSA:          2.233 m Patient Age:    76 years            BP:           100/64 mmHg Patient Gender: F                   HR:           58 bpm. Exam Location:  Inpatient Procedure: 2D Echo, 3D Echo, Cardiac Doppler and Color Doppler Indications:    I50.40* Unspecified combined systolic (congestive) and diastolic                 (congestive) heart failure  History:        Patient has no prior history of Echocardiogram examinations.                 CHF, Signs/Symptoms:Edema, Shortness of Breath and Dyspnea; Risk                 Factors:Hypertension.  to evaluate the vascular anatomy. RADIATION DOSE REDUCTION: This exam was performed according to the departmental dose-optimization program which includes automated exposure control, adjustment of the mA and/or kV according to patient size and/or use of iterative reconstruction technique. CONTRAST:  75mL OMNIPAQUE IOHEXOL 350 MG/ML SOLN COMPARISON:  None Available. FINDINGS: Cardiovascular: Heart is enlarged and there is a moderate-to-large pericardial effusion. Few scattered coronary artery calcifications are noted. There is atherosclerotic calcification of the  aorta without evidence of aneurysm. The pulmonary trunk is distended suggesting underlying pulmonary artery hypertension. No definite evidence of pulmonary embolism. Segmental and subsegmental pulmonary emboli are noted in the right upper lobe. The right ventricle is markedly distended, concerning for right heart strain. Mediastinum/Nodes: No mediastinal, hilar, or axillary lymphadenopathy. There is a 2.5 cm enhancing nodule in the left lobe of the thyroid gland. The trachea and esophagus are within normal limits. Lungs/Pleura: There is a moderate right pleural effusion with compressive atelectasis or infiltrate. Patchy atelectasis or infiltrate is present in the lungs bilaterally. No pneumothorax. Upper Abdomen: The gallbladder is surgically absent. A 1.1 cm exophytic nodule is noted in the mid left kidney with indeterminate imaging characteristics. Mild anasarca is noted. There is partial visualization of herniation of small bowel in the subcutaneous tissues in the right upper quadrant. Musculoskeletal: Degenerative changes are present in the thoracic spine. No acute fracture. Review of the MIP images confirms the above findings. IMPRESSION: 1. Right upper lobe segmental and subsegmental pulmonary emboli. There is evidence of right heart strain. 2. Cardiomegaly with large pericardial effusion. 3. Moderate right pleural effusion. 4. Patchy airspace disease bilaterally with consolidation in the right lower lobe, possible atelectasis or infiltrate. 5. Aortic atherosclerosis and coronary artery calcifications. 6. Complex hyperdense lesion in the mid left kidney. Nonemergent ultrasound is recommended for further characterization. 7. Partial visualization of herniated small bowel in the right upper quadrant. Electronically Signed: By: Thornell Sartorius M.D. On: 06/22/2023 20:14   ECHOCARDIOGRAM COMPLETE  Result Date: 06/22/2023    ECHOCARDIOGRAM REPORT   Patient Name:   Carrie Guzman Schaper Date of Exam: 06/22/2023  Medical Rec #:  [id number]           Height:       64.0 in Accession #:    [id number]          Weight:       272.9 lb Date of Birth:  [birth year]           BSA:          2.233 m Patient Age:    76 years            BP:           100/64 mmHg Patient Gender: F                   HR:           58 bpm. Exam Location:  Inpatient Procedure: 2D Echo, 3D Echo, Cardiac Doppler and Color Doppler Indications:    I50.40* Unspecified combined systolic (congestive) and diastolic                 (congestive) heart failure  History:        Patient has no prior history of Echocardiogram examinations.                 CHF, Signs/Symptoms:Edema, Shortness of Breath and Dyspnea; Risk                 Factors:Hypertension.  100.8*  --  102.0* 105.7*  MCH 32.6  --  32.5 32.6  MCHC 32.3  --  31.8 30.8  RDW 18.4*  --  17.9* 17.6*  PLT 111*  --  65* 66*   Thyroid  Recent Labs  Lab 06/21/23 1852  TSH 0.252*  FREET4 1.57*    BNP Recent Labs  Lab 06/21/23 1203  BNP 1,771.2*    DDimer No results for input(s): "DDIMER" in the last 168 hours.   Radiology    VAS [place] LOWER EXTREMITY VENOUS (DVT)  Result Date: 06/23/2023  Lower Venous DVT Study Patient Name:  AKEIA HAWKEY Mcintire  Date of Exam:   06/23/2023 Medical Rec #: [id number]            Accession #:    [id number] Date of Birth: 1946-07-23            Patient Gender: F Patient Age:   59 years Exam Location:  [facility] Procedure:      VAS [place] LOWER EXTREMITY VENOUS (DVT) Referring Phys: Stephania Fragmin --------------------------------------------------------------------------------  Indications: Pulmonary embolism, and chronic leg swelling.  Limitations: Body habitus and poor ultrasound/tissue interface. Comparison Study: No previous study. Performing Technologist: McKayla Maag RVT, [place]  Examination Guidelines: A complete evaluation includes B-mode imaging, spectral Doppler, color Doppler, and power Doppler as needed of all accessible portions of each vessel.  Bilateral testing is considered an integral part of a complete examination. Limited examinations for reoccurring indications may be performed as noted. The reflux portion of the exam is performed with the patient in reverse Trendelenburg.  +--------+---------------+---------+-----------+----------+--------------------+ RIGHT   CompressibilityPhasicitySpontaneityPropertiesThrombus Aging       +--------+---------------+---------+-----------+----------+--------------------+ CFV     Full           Yes      Yes                                       +--------+---------------+---------+-----------+----------+--------------------+ SFJ     Full                                                              +--------+---------------+---------+-----------+----------+--------------------+ FV Prox Full                                                              +--------+---------------+---------+-----------+----------+--------------------+ FV Mid                 Yes      Yes                  Patent by color                                                           doppler              +--------+---------------+---------+-----------+----------+--------------------+ FV  100.8*  --  102.0* 105.7*  MCH 32.6  --  32.5 32.6  MCHC 32.3  --  31.8 30.8  RDW 18.4*  --  17.9* 17.6*  PLT 111*  --  65* 66*   Thyroid  Recent Labs  Lab 06/21/23 1852  TSH 0.252*  FREET4 1.57*    BNP Recent Labs  Lab 06/21/23 1203  BNP 1,771.2*    DDimer No results for input(s): "DDIMER" in the last 168 hours.   Radiology    VAS [place] LOWER EXTREMITY VENOUS (DVT)  Result Date: 06/23/2023  Lower Venous DVT Study Patient Name:  AKEIA HAWKEY Mcintire  Date of Exam:   06/23/2023 Medical Rec #: [id number]            Accession #:    [id number] Date of Birth: 1946-07-23            Patient Gender: F Patient Age:   59 years Exam Location:  [facility] Procedure:      VAS [place] LOWER EXTREMITY VENOUS (DVT) Referring Phys: Stephania Fragmin --------------------------------------------------------------------------------  Indications: Pulmonary embolism, and chronic leg swelling.  Limitations: Body habitus and poor ultrasound/tissue interface. Comparison Study: No previous study. Performing Technologist: McKayla Maag RVT, [place]  Examination Guidelines: A complete evaluation includes B-mode imaging, spectral Doppler, color Doppler, and power Doppler as needed of all accessible portions of each vessel.  Bilateral testing is considered an integral part of a complete examination. Limited examinations for reoccurring indications may be performed as noted. The reflux portion of the exam is performed with the patient in reverse Trendelenburg.  +--------+---------------+---------+-----------+----------+--------------------+ RIGHT   CompressibilityPhasicitySpontaneityPropertiesThrombus Aging       +--------+---------------+---------+-----------+----------+--------------------+ CFV     Full           Yes      Yes                                       +--------+---------------+---------+-----------+----------+--------------------+ SFJ     Full                                                              +--------+---------------+---------+-----------+----------+--------------------+ FV Prox Full                                                              +--------+---------------+---------+-----------+----------+--------------------+ FV Mid                 Yes      Yes                  Patent by color                                                           doppler              +--------+---------------+---------+-----------+----------+--------------------+ FV

## 2023-06-24 NOTE — Progress Notes (Signed)
for input(s): "PROCALCITON", "LATICACIDVEN" in the last 168 hours.  Recent Results (from the past 240 hour(s))  SARS Coronavirus 2 by RT PCR (hospital order, performed in [facility] hospital lab) *cepheid single result test* Anterior Nasal Swab     Status: None   Collection Time: 06/21/23 12:07 PM   Specimen: Anterior Nasal Swab  Result Value Ref Range Status   SARS Coronavirus 2 by RT PCR NEGATIVE NEGATIVE Final    Comment: (NOTE) SARS-CoV-2 target nucleic acids are NOT DETECTED.  The SARS-CoV-2 RNA is generally detectable in upper and lower respiratory specimens during the acute phase of infection. The lowest concentration of SARS-CoV-2 viral copies this assay can detect is 250 copies / mL. A negative result does not preclude SARS-CoV-2 infection and should not be used as the sole basis for treatment or other patient management decisions.  A negative result may occur with improper specimen collection / handling, submission of specimen other than nasopharyngeal swab, presence of viral mutation(s) within the areas targeted by this assay, and inadequate number of viral copies (<250 copies / mL). A negative result must be combined with clinical observations, patient history, and  epidemiological information.  Fact Sheet for Patients:   RoadLapTop.co.za  Fact Sheet for Healthcare Providers: http://kim-miller.com/  This test is not yet approved or  cleared by the [place] FDA and has been authorized for detection and/or diagnosis of SARS-CoV-2 by FDA under an Emergency Use Authorization (EUA).  This EUA will remain in effect (meaning this test can be used) for the duration of the COVID-19 declaration under Section 564(b)(1) of the Act, 21 U.S.C. section 360bbb-3(b)(1), unless the authorization is terminated or revoked sooner.  Performed at [facility], [address] [place], [place] [address]          Radiology Studies: VAS [place] LOWER EXTREMITY VENOUS (DVT)  Result Date: 06/23/2023  Lower Venous DVT Study Patient Name:  Carrie Guzman  Date of Exam:   06/23/2023 Medical Rec #: [id number]            Accession #:    [id number] Date of Birth: 1946-11-06            Patient Gender: F Patient Age:   77 years Exam Location:  [facility] Procedure:      VAS [place] LOWER EXTREMITY VENOUS (DVT) Referring Phys: Stephania Fragmin --------------------------------------------------------------------------------  Indications: Pulmonary embolism, and chronic leg swelling.  Limitations: Body habitus and poor ultrasound/tissue interface. Comparison Study: No previous study. Performing Technologist: McKayla Maag RVT, [place]  Examination Guidelines: A complete evaluation includes B-mode imaging, spectral Doppler, color Doppler, and power Doppler as needed of all accessible portions of each vessel. Bilateral testing is considered an integral part of a complete examination. Limited examinations for reoccurring indications may be performed as noted. The reflux portion of the exam is performed with the patient in reverse Trendelenburg.  +--------+---------------+---------+-----------+----------+--------------------+ RIGHT    CompressibilityPhasicitySpontaneityPropertiesThrombus Aging       +--------+---------------+---------+-----------+----------+--------------------+ CFV     Full           Yes      Yes                                       +--------+---------------+---------+-----------+----------+--------------------+ SFJ     Full                                                              +--------+---------------+---------+-----------+----------+--------------------+  PROGRESS NOTE    Carrie Guzman  WUJ:[id number] DOB: [birth year] DOA: 06/21/2023 PCP: Melida Quitter, MD    Brief Narrative:  77 year old with history of lymphedema, HTN, obesity admitted to the hospital with concerns of shortness of breath ongoing for 2-3 weeks.  Patient was diagnosed with CHF exacerbation.  She had elevated BNP, total bilirubin was elevated.  Upon admission started on diuretics.  Echocardiogram showed preserved EF with elevated PASP, cor pulmonale and pericardial effusion without tamponade.  CTA chest showed acute pulmonary embolism but right side heart strain, case discussed with pulmonary who recommended heparin otherwise no indication for thrombectomy at this time.  Initial plan was to transfer patient to [facility] for RHC but given acute pulmonary embolism, will monitor the patient     Assessment & Plan:  Principal Problem:   Acute CHF (congestive heart failure) (HCC) Active Problems:   Acute systolic (congestive) heart failure (HCC)   Acute respiratory distress with mild hypoxia Acute congestive heart failure, preserved ejection fraction" pulmonology, EF 60% Pericardial effusion without tamponade -Patient certainly has right-sided heart failure and signs of peripheral volume overload but given high risk of hypotension holding off on diuretics at this time.  Patient seen by cardiology, eventually will need RHC once euvolemic.  Repeat echo in 2 weeks.  IV Lasix X1 time   Acute right upper lobe and subsegmental pulmonary embolism without cor pulmonale Moderate pleural effusion, left - Seen by pulmonary, no indication for thrombectomy at this time.  Continue anticoagulation.  2 extremity Dopplers negative for DVT.  Will need outpatient sleep study.  Thrombocytopenia - Platelets drifted down but now they are stable around 66.  Continue anticoagulation.  Seen by hematology.  No need to hold anticoagulation unless if she develops bleeding or platelets are  below 30.  Macrocytosis - Checking for B12 and folate   Low TSH and elevated free T4 - Likely euthyroid sick syndrome.  Will need to repeat in next 3-4 weeks   Elevated total bilirubin, improving - Unclear etiology.  Mild hepatic congestion secondary to right-sided RV dysfunction   Lymphedema -this appears to be chronic   Hypokalemia -Replete as needed   Hyperlipidemia -continue home Crestor    Hypertension -c currently on Coreg.  IV as needed   Morbid obesity-BMI 42.9, complicating all aspects of care    PT/OT-home health, face-to-face completed   DVT prophylaxis: Heparin drip Code Status: Full code Family Communication: Husband present at bedside Status is: Inpatient Remains inpatient appropriate because: Ongoing management for acute pulmonary embolism and CHF                 Diet Orders (From admission, onward)     Start     Ordered   06/24/23 0815  Diet Heart Room service appropriate? Yes; Fluid consistency: Thin  Diet effective now       Question Answer Comment  Room service appropriate? Yes   Fluid consistency: Thin      06/24/23 0815            Subjective: Husband at bedside.  Some bleeding from IV site where heparin is running.  Overall no other new complaints   Examination:  General exam: Appears calm and comfortable, obese Respiratory system: Clear to auscultation. Respiratory effort normal. Cardiovascular system: S1 & S2 heard, RRR. No JVD, murmurs, rubs, gallops or clicks. No pedal edema. Gastrointestinal system: Abdomen is nondistended, soft and nontender. No organomegaly or masses felt. Normal bowel sounds heard. Central nervous system:  for input(s): "PROCALCITON", "LATICACIDVEN" in the last 168 hours.  Recent Results (from the past 240 hour(s))  SARS Coronavirus 2 by RT PCR (hospital order, performed in [facility] hospital lab) *cepheid single result test* Anterior Nasal Swab     Status: None   Collection Time: 06/21/23 12:07 PM   Specimen: Anterior Nasal Swab  Result Value Ref Range Status   SARS Coronavirus 2 by RT PCR NEGATIVE NEGATIVE Final    Comment: (NOTE) SARS-CoV-2 target nucleic acids are NOT DETECTED.  The SARS-CoV-2 RNA is generally detectable in upper and lower respiratory specimens during the acute phase of infection. The lowest concentration of SARS-CoV-2 viral copies this assay can detect is 250 copies / mL. A negative result does not preclude SARS-CoV-2 infection and should not be used as the sole basis for treatment or other patient management decisions.  A negative result may occur with improper specimen collection / handling, submission of specimen other than nasopharyngeal swab, presence of viral mutation(s) within the areas targeted by this assay, and inadequate number of viral copies (<250 copies / mL). A negative result must be combined with clinical observations, patient history, and  epidemiological information.  Fact Sheet for Patients:   RoadLapTop.co.za  Fact Sheet for Healthcare Providers: http://kim-miller.com/  This test is not yet approved or  cleared by the [place] FDA and has been authorized for detection and/or diagnosis of SARS-CoV-2 by FDA under an Emergency Use Authorization (EUA).  This EUA will remain in effect (meaning this test can be used) for the duration of the COVID-19 declaration under Section 564(b)(1) of the Act, 21 U.S.C. section 360bbb-3(b)(1), unless the authorization is terminated or revoked sooner.  Performed at [facility], [address] [place], [place] [address]          Radiology Studies: VAS [place] LOWER EXTREMITY VENOUS (DVT)  Result Date: 06/23/2023  Lower Venous DVT Study Patient Name:  Carrie Guzman  Date of Exam:   06/23/2023 Medical Rec #: [id number]            Accession #:    [id number] Date of Birth: 1946-11-06            Patient Gender: F Patient Age:   77 years Exam Location:  [facility] Procedure:      VAS [place] LOWER EXTREMITY VENOUS (DVT) Referring Phys: Stephania Fragmin --------------------------------------------------------------------------------  Indications: Pulmonary embolism, and chronic leg swelling.  Limitations: Body habitus and poor ultrasound/tissue interface. Comparison Study: No previous study. Performing Technologist: McKayla Maag RVT, [place]  Examination Guidelines: A complete evaluation includes B-mode imaging, spectral Doppler, color Doppler, and power Doppler as needed of all accessible portions of each vessel. Bilateral testing is considered an integral part of a complete examination. Limited examinations for reoccurring indications may be performed as noted. The reflux portion of the exam is performed with the patient in reverse Trendelenburg.  +--------+---------------+---------+-----------+----------+--------------------+ RIGHT    CompressibilityPhasicitySpontaneityPropertiesThrombus Aging       +--------+---------------+---------+-----------+----------+--------------------+ CFV     Full           Yes      Yes                                       +--------+---------------+---------+-----------+----------+--------------------+ SFJ     Full                                                              +--------+---------------+---------+-----------+----------+--------------------+  PROGRESS NOTE    Carrie Guzman  WUJ:[id number] DOB: [birth year] DOA: 06/21/2023 PCP: Melida Quitter, MD    Brief Narrative:  77 year old with history of lymphedema, HTN, obesity admitted to the hospital with concerns of shortness of breath ongoing for 2-3 weeks.  Patient was diagnosed with CHF exacerbation.  She had elevated BNP, total bilirubin was elevated.  Upon admission started on diuretics.  Echocardiogram showed preserved EF with elevated PASP, cor pulmonale and pericardial effusion without tamponade.  CTA chest showed acute pulmonary embolism but right side heart strain, case discussed with pulmonary who recommended heparin otherwise no indication for thrombectomy at this time.  Initial plan was to transfer patient to [facility] for RHC but given acute pulmonary embolism, will monitor the patient     Assessment & Plan:  Principal Problem:   Acute CHF (congestive heart failure) (HCC) Active Problems:   Acute systolic (congestive) heart failure (HCC)   Acute respiratory distress with mild hypoxia Acute congestive heart failure, preserved ejection fraction" pulmonology, EF 60% Pericardial effusion without tamponade -Patient certainly has right-sided heart failure and signs of peripheral volume overload but given high risk of hypotension holding off on diuretics at this time.  Patient seen by cardiology, eventually will need RHC once euvolemic.  Repeat echo in 2 weeks.  IV Lasix X1 time   Acute right upper lobe and subsegmental pulmonary embolism without cor pulmonale Moderate pleural effusion, left - Seen by pulmonary, no indication for thrombectomy at this time.  Continue anticoagulation.  2 extremity Dopplers negative for DVT.  Will need outpatient sleep study.  Thrombocytopenia - Platelets drifted down but now they are stable around 66.  Continue anticoagulation.  Seen by hematology.  No need to hold anticoagulation unless if she develops bleeding or platelets are  below 30.  Macrocytosis - Checking for B12 and folate   Low TSH and elevated free T4 - Likely euthyroid sick syndrome.  Will need to repeat in next 3-4 weeks   Elevated total bilirubin, improving - Unclear etiology.  Mild hepatic congestion secondary to right-sided RV dysfunction   Lymphedema -this appears to be chronic   Hypokalemia -Replete as needed   Hyperlipidemia -continue home Crestor    Hypertension -c currently on Coreg.  IV as needed   Morbid obesity-BMI 42.9, complicating all aspects of care    PT/OT-home health, face-to-face completed   DVT prophylaxis: Heparin drip Code Status: Full code Family Communication: Husband present at bedside Status is: Inpatient Remains inpatient appropriate because: Ongoing management for acute pulmonary embolism and CHF                 Diet Orders (From admission, onward)     Start     Ordered   06/24/23 0815  Diet Heart Room service appropriate? Yes; Fluid consistency: Thin  Diet effective now       Question Answer Comment  Room service appropriate? Yes   Fluid consistency: Thin      06/24/23 0815            Subjective: Husband at bedside.  Some bleeding from IV site where heparin is running.  Overall no other new complaints   Examination:  General exam: Appears calm and comfortable, obese Respiratory system: Clear to auscultation. Respiratory effort normal. Cardiovascular system: S1 & S2 heard, RRR. No JVD, murmurs, rubs, gallops or clicks. No pedal edema. Gastrointestinal system: Abdomen is nondistended, soft and nontender. No organomegaly or masses felt. Normal bowel sounds heard. Central nervous system:  for input(s): "PROCALCITON", "LATICACIDVEN" in the last 168 hours.  Recent Results (from the past 240 hour(s))  SARS Coronavirus 2 by RT PCR (hospital order, performed in [facility] hospital lab) *cepheid single result test* Anterior Nasal Swab     Status: None   Collection Time: 06/21/23 12:07 PM   Specimen: Anterior Nasal Swab  Result Value Ref Range Status   SARS Coronavirus 2 by RT PCR NEGATIVE NEGATIVE Final    Comment: (NOTE) SARS-CoV-2 target nucleic acids are NOT DETECTED.  The SARS-CoV-2 RNA is generally detectable in upper and lower respiratory specimens during the acute phase of infection. The lowest concentration of SARS-CoV-2 viral copies this assay can detect is 250 copies / mL. A negative result does not preclude SARS-CoV-2 infection and should not be used as the sole basis for treatment or other patient management decisions.  A negative result may occur with improper specimen collection / handling, submission of specimen other than nasopharyngeal swab, presence of viral mutation(s) within the areas targeted by this assay, and inadequate number of viral copies (<250 copies / mL). A negative result must be combined with clinical observations, patient history, and  epidemiological information.  Fact Sheet for Patients:   RoadLapTop.co.za  Fact Sheet for Healthcare Providers: http://kim-miller.com/  This test is not yet approved or  cleared by the [place] FDA and has been authorized for detection and/or diagnosis of SARS-CoV-2 by FDA under an Emergency Use Authorization (EUA).  This EUA will remain in effect (meaning this test can be used) for the duration of the COVID-19 declaration under Section 564(b)(1) of the Act, 21 U.S.C. section 360bbb-3(b)(1), unless the authorization is terminated or revoked sooner.  Performed at [facility], [address] [place], [place] [address]          Radiology Studies: VAS [place] LOWER EXTREMITY VENOUS (DVT)  Result Date: 06/23/2023  Lower Venous DVT Study Patient Name:  Carrie Guzman  Date of Exam:   06/23/2023 Medical Rec #: [id number]            Accession #:    [id number] Date of Birth: 1946-11-06            Patient Gender: F Patient Age:   77 years Exam Location:  [facility] Procedure:      VAS [place] LOWER EXTREMITY VENOUS (DVT) Referring Phys: Stephania Fragmin --------------------------------------------------------------------------------  Indications: Pulmonary embolism, and chronic leg swelling.  Limitations: Body habitus and poor ultrasound/tissue interface. Comparison Study: No previous study. Performing Technologist: McKayla Maag RVT, [place]  Examination Guidelines: A complete evaluation includes B-mode imaging, spectral Doppler, color Doppler, and power Doppler as needed of all accessible portions of each vessel. Bilateral testing is considered an integral part of a complete examination. Limited examinations for reoccurring indications may be performed as noted. The reflux portion of the exam is performed with the patient in reverse Trendelenburg.  +--------+---------------+---------+-----------+----------+--------------------+ RIGHT    CompressibilityPhasicitySpontaneityPropertiesThrombus Aging       +--------+---------------+---------+-----------+----------+--------------------+ CFV     Full           Yes      Yes                                       +--------+---------------+---------+-----------+----------+--------------------+ SFJ     Full                                                              +--------+---------------+---------+-----------+----------+--------------------+  PROGRESS NOTE    Carrie Guzman  WUJ:[id number] DOB: [birth year] DOA: 06/21/2023 PCP: Melida Quitter, MD    Brief Narrative:  77 year old with history of lymphedema, HTN, obesity admitted to the hospital with concerns of shortness of breath ongoing for 2-3 weeks.  Patient was diagnosed with CHF exacerbation.  She had elevated BNP, total bilirubin was elevated.  Upon admission started on diuretics.  Echocardiogram showed preserved EF with elevated PASP, cor pulmonale and pericardial effusion without tamponade.  CTA chest showed acute pulmonary embolism but right side heart strain, case discussed with pulmonary who recommended heparin otherwise no indication for thrombectomy at this time.  Initial plan was to transfer patient to [facility] for RHC but given acute pulmonary embolism, will monitor the patient     Assessment & Plan:  Principal Problem:   Acute CHF (congestive heart failure) (HCC) Active Problems:   Acute systolic (congestive) heart failure (HCC)   Acute respiratory distress with mild hypoxia Acute congestive heart failure, preserved ejection fraction" pulmonology, EF 60% Pericardial effusion without tamponade -Patient certainly has right-sided heart failure and signs of peripheral volume overload but given high risk of hypotension holding off on diuretics at this time.  Patient seen by cardiology, eventually will need RHC once euvolemic.  Repeat echo in 2 weeks.  IV Lasix X1 time   Acute right upper lobe and subsegmental pulmonary embolism without cor pulmonale Moderate pleural effusion, left - Seen by pulmonary, no indication for thrombectomy at this time.  Continue anticoagulation.  2 extremity Dopplers negative for DVT.  Will need outpatient sleep study.  Thrombocytopenia - Platelets drifted down but now they are stable around 66.  Continue anticoagulation.  Seen by hematology.  No need to hold anticoagulation unless if she develops bleeding or platelets are  below 30.  Macrocytosis - Checking for B12 and folate   Low TSH and elevated free T4 - Likely euthyroid sick syndrome.  Will need to repeat in next 3-4 weeks   Elevated total bilirubin, improving - Unclear etiology.  Mild hepatic congestion secondary to right-sided RV dysfunction   Lymphedema -this appears to be chronic   Hypokalemia -Replete as needed   Hyperlipidemia -continue home Crestor    Hypertension -c currently on Coreg.  IV as needed   Morbid obesity-BMI 42.9, complicating all aspects of care    PT/OT-home health, face-to-face completed   DVT prophylaxis: Heparin drip Code Status: Full code Family Communication: Husband present at bedside Status is: Inpatient Remains inpatient appropriate because: Ongoing management for acute pulmonary embolism and CHF                 Diet Orders (From admission, onward)     Start     Ordered   06/24/23 0815  Diet Heart Room service appropriate? Yes; Fluid consistency: Thin  Diet effective now       Question Answer Comment  Room service appropriate? Yes   Fluid consistency: Thin      06/24/23 0815            Subjective: Husband at bedside.  Some bleeding from IV site where heparin is running.  Overall no other new complaints   Examination:  General exam: Appears calm and comfortable, obese Respiratory system: Clear to auscultation. Respiratory effort normal. Cardiovascular system: S1 & S2 heard, RRR. No JVD, murmurs, rubs, gallops or clicks. No pedal edema. Gastrointestinal system: Abdomen is nondistended, soft and nontender. No organomegaly or masses felt. Normal bowel sounds heard. Central nervous system:

## 2023-06-24 NOTE — Progress Notes (Signed)
Hematology Short note     Latest Ref Rng & Units 06/24/2023   12:38 AM 06/23/2023    5:49 AM 06/21/2023    1:32 PM  CBC  WBC 4.0 - 10.5 K/uL 6.5  8.8    Hemoglobin 12.0 - 15.0 g/dL 16.1  09.6  04.5   Hematocrit 36.0 - 46.0 % 46.1  44.9  45.0   Platelets 150 - 400 K/uL 66  65     PLT stable at 66k today.  Immat platelet fraction 2.6-- not elevated and does not suggest overt antibody mediated immune thrombocytopenia. TRSH low elevated FT4-- mx per hospitalist.  Continue to monitor PLT on anticoagulation  Wyvonnia Lora MD

## 2023-06-24 NOTE — Progress Notes (Signed)
Physical Therapy Treatment Patient Details Name: Carrie Guzman MRN: [id number] DOB: 12/15/1945 Today's Date: 06/24/2023   History of Present Illness 77 year old with history of lymphedema, HTN, obesity admitted to the hospital with concerns of shortness of breath ongoing for 2-3 weeks.  Patient was diagnosed with CHF exacerbation and severe pulmonary hypertension.  Also found to have acute right upper lobe and subsegmental pulmonary embolism without cor pulmonale.    PT Comments  Pt requiring increased time to mobilize however able to progress ambulation to 35 ft into hallway with RW.  Pt currently on 3L O2 [place] and SpO2 92% during ambulation.     If plan is discharge home, recommend the following: A little help with walking and/or transfers;A little help with bathing/dressing/bathroom;Assist for transportation;Help with stairs or ramp for entrance   Can travel by private vehicle        Equipment Recommendations  Rolling walker (2 wheels)    Recommendations for Other Services       Precautions / Restrictions Precautions Precautions: Fall Precaution Comments: monitor sats     Mobility  Bed Mobility Overal bed mobility: Needs Assistance Bed Mobility: Supine to Sit, Sit to Supine     Supine to sit: Contact guard Sit to supine: Mod assist   General bed mobility comments: increased time and effort, assist for LEs onto bed    Transfers Overall transfer level: Needs assistance Equipment used: Rolling walker (2 wheels) Transfers: Sit to/from Stand Sit to Stand: From elevated surface, Contact guard assist           General transfer comment: verbal cues for hand placement, utilized wide BOS    Ambulation/Gait Ambulation/Gait assistance: Contact guard assist Gait Distance (Feet): 35 Feet Assistive device: Rolling walker (2 wheels) Gait Pattern/deviations: Step-to pattern, Decreased stride length, Trunk flexed       General Gait Details: cues for posture and RW  positioning, remained on 3L O2 [place] and SPO2 92% during ambulation, pt reports 2/4 dyspnea, requires increased time, effortful movement   Stairs             Wheelchair Mobility     Tilt Bed    Modified Rankin (Stroke Patients Only)       Balance                                            Cognition Arousal: Alert Behavior During Therapy: WFL for tasks assessed/performed Overall Cognitive Status: Within Functional Limits for tasks assessed                                          Exercises      General Comments        Pertinent Vitals/Pain Pain Assessment Pain Assessment: No/denies pain    Home Living                          Prior Function            PT Goals (current goals can now be found in the care plan section) Progress towards PT goals: Progressing toward goals    Frequency    Min 1X/week      PT Plan      Co-evaluation  AM-PAC PT "6 Clicks" Mobility   Outcome Measure  Help needed turning from your back to your side while in a flat bed without using bedrails?: A Little Help needed moving from lying on your back to sitting on the side of a flat bed without using bedrails?: A Lot Help needed moving to and from a bed to a chair (including a wheelchair)?: A Lot Help needed standing up from a chair using your arms (e.g., wheelchair or bedside chair)?: A Little Help needed to walk in hospital room?: A Little Help needed climbing 3-5 steps with a railing? : A Lot 6 Click Score: 15    End of Session Equipment Utilized During Treatment: Oxygen Activity Tolerance: Patient tolerated treatment well Patient left: in bed;with call bell/phone within reach;with family/visitor present;with nursing/sitter in room Nurse Communication: Mobility status PT Visit Diagnosis: Difficulty in walking, not elsewhere classified (R26.2)     Time: 1914-7829 PT Time Calculation (min) (ACUTE ONLY):  27 min  Charges:    $Gait Training: 23-37 mins PT General Charges $$ ACUTE PT VISIT: 1 Visit                     Paulino Door, DPT Physical Therapist Acute Rehabilitation Services Office: [phone]    Janan Halter [place] 06/24/2023, 3:43 PM

## 2023-06-24 NOTE — Progress Notes (Signed)
ANTICOAGULATION CONSULT NOTE Pharmacy Consult for Heparin  Indication: pulmonary embolus  No Known Allergies  Patient Measurements: Height: 5\' 4"  (162.6 cm) Weight: 122.4 kg (269 lb 12.8 oz) IBW/kg (Calculated) : 54.7 Heparin Dosing Weight: 82 kg  Vital Signs: Temp: 98.5 F (36.9 C) (08/17 0000) Temp Source: Oral (08/17 0000) BP: 123/60 (08/17 0000) Pulse Rate: 56 (08/17 0000)  Labs: Recent Labs    06/21/23 1153 06/21/23 1331 06/21/23 1332 06/22/23 0452 06/23/23 0549 06/23/23 1541 06/24/23 0038  HGB 15.9*  --  15.3*  --  14.3  --  14.2  HCT 49.2*  --  45.0  --  44.9  --  46.1*  PLT 111*  --   --   --  65*  --  66*  LABPROT 16.2*  --   --   --   --   --   --   INR 1.3*  --   --   --   --   --   --   HEPARINUNFRC  --   --   --   --  0.84* 0.66 0.51  CREATININE 0.83  --   --  0.80 0.45  --  0.61  TROPONINIHS 22* 27*  --   --   --   --   --     Estimated Creatinine Clearance: 78.5 mL/min (by C-G formula based on SCr of 0.61 mg/dL).   Medications:  Scheduled:   carvedilol  6.25 mg Oral BID WC   Chlorhexidine Gluconate Cloth  6 each Topical Daily   rosuvastatin  10 mg Oral Daily   Infusions:   sodium chloride     heparin 1,200 Units/hr (06/23/23 1726)    Assessment: 76 yoF admitted on 8/14 with CHF exacerbation. 8/15 CT is positive for PE with RH strain; pharmacy consulted to dose heparin. She has been on prophylactic Lovenox 0.5 mg/kg daily - most recent dose on 8/15 at 18:00   Today, 06/24/2023: Heparin level 0.51- therapeutic on IV heparin 1200 units/hr CBC: Hg stable WNL, pltc low but stable x 24h Ok to cont heparin if pltc>30 & not actively bleeding per hematologist No bleeding or infusion related issues reported by RN.  Nosebleed resolved.  Goal of Therapy:  Heparin level 0.3-0.7 Monitor platelets by anticoagulation protocol: Yes   Plan:  Continue heparin at 1200 units/hr Daily CBC and heparin level F/U plans for RHC, oral anticoagulant  Junita Push, PharmD, BCPS 06/24/2023, 2:58 AM

## 2023-06-25 DIAGNOSIS — I50813 Acute on chronic right heart failure: Secondary | ICD-10-CM

## 2023-06-25 DIAGNOSIS — I5031 Acute diastolic (congestive) heart failure: Secondary | ICD-10-CM | POA: Diagnosis not present

## 2023-06-25 LAB — CBC
HCT: 48.8 % — ABNORMAL HIGH (ref 36.0–46.0)
Hemoglobin: 14.9 g/dL (ref 12.0–15.0)
MCH: 32.3 pg (ref 26.0–34.0)
MCHC: 30.5 g/dL (ref 30.0–36.0)
MCV: 105.6 fL — ABNORMAL HIGH (ref 80.0–100.0)
Platelets: 67 10*3/uL — ABNORMAL LOW (ref 150–400)
RBC: 4.62 MIL/uL (ref 3.87–5.11)
RDW: 17.7 % — ABNORMAL HIGH (ref 11.5–15.5)
WBC: 7.5 10*3/uL (ref 4.0–10.5)
nRBC: 0 % (ref 0.0–0.2)

## 2023-06-25 LAB — COMPREHENSIVE METABOLIC PANEL
ALT: 16 U/L (ref 0–44)
AST: 17 U/L (ref 15–41)
Albumin: 2.9 g/dL — ABNORMAL LOW (ref 3.5–5.0)
Alkaline Phosphatase: 84 U/L (ref 38–126)
Anion gap: 8 (ref 5–15)
BUN: 20 mg/dL (ref 8–23)
CO2: 30 mmol/L (ref 22–32)
Calcium: 9.9 mg/dL (ref 8.9–10.3)
Chloride: 106 mmol/L (ref 98–111)
Creatinine, Ser: 0.58 mg/dL (ref 0.44–1.00)
GFR, Estimated: >60 mL/min (ref >60)
Glucose, Bld: 92 mg/dL (ref 70–99)
Potassium: 3.7 mmol/L (ref 3.5–5.1)
Sodium: 144 mmol/L (ref 135–145)
Total Bilirubin: 3.8 mg/dL — ABNORMAL HIGH (ref 0.3–1.2)
Total Protein: 6.2 g/dL — ABNORMAL LOW (ref 6.5–8.1)

## 2023-06-25 LAB — MAGNESIUM: Magnesium: 1.9 mg/dL (ref 1.7–2.4)

## 2023-06-25 LAB — HEPARIN LEVEL (UNFRACTIONATED): Heparin Unfractionated: 0.38 [IU]/mL (ref 0.30–0.70)

## 2023-06-25 LAB — VITAMIN B12: Vitamin B-12: 561 pg/mL (ref 180–914)

## 2023-06-25 LAB — FOLATE: Folate: 13.5 ng/mL (ref >5.9)

## 2023-06-25 MED ORDER — FUROSEMIDE 10 MG/ML IJ SOLN
40.0000 mg | Freq: Once | INTRAMUSCULAR | Status: AC
  Administered 2023-06-25: 40 mg via INTRAVENOUS
  Filled 2023-06-25: qty 4

## 2023-06-25 NOTE — Progress Notes (Signed)
ANTICOAGULATION CONSULT NOTE Pharmacy Consult for Heparin  Indication: pulmonary embolus  No Known Allergies  Patient Measurements: Height: 5\' 4"  (162.6 cm) Weight: 119.1 kg (262 lb 9.1 oz) IBW/kg (Calculated) : 54.7 Heparin Dosing Weight: 82 kg  Vital Signs: Temp: 98.1 F (36.7 C) (08/18 0419) Temp Source: Oral (08/18 0419) BP: 155/64 (08/18 0419) Pulse Rate: 59 (08/18 0419)  Labs: Recent Labs    06/23/23 0549 06/23/23 1541 06/24/23 0038 06/25/23 0507  HGB 14.3  --  14.2 14.9  HCT 44.9  --  46.1* 48.8*  PLT 65*  --  66* 67*  HEPARINUNFRC 0.84* 0.66 0.51 0.38  CREATININE 0.45  --  0.61 0.58    Estimated Creatinine Clearance: 77.2 mL/min (by C-G formula based on SCr of 0.58 mg/dL).   Medications:  Scheduled:   Chlorhexidine Gluconate Cloth  6 each Topical Daily   rosuvastatin  10 mg Oral Daily   Infusions:   heparin 1,200 Units/hr (06/24/23 1628)    Assessment: 76 yoF admitted on 8/14 with CHF exacerbation. 8/15 CT is positive for PE with RH strain; pharmacy consulted to dose heparin. She has been on prophylactic Lovenox 0.5 mg/kg daily - most recent dose on 8/15 at 18:00   Today, 06/25/2023: Heparin level 0.38- therapeutic on IV heparin 1200 units/hr CBC: Hg stable WNL, pltc low but stable around 65  Ok to cont heparin if pltc>30 & not actively bleeding per hematologist No bleeding or infusion related issues reported by RN.   Goal of Therapy:  Heparin level 0.3-0.7 Monitor platelets by anticoagulation protocol: Yes   Plan:  Continue heparin at 1200 units/hr Daily CBC and heparin level F/U plans for RHC, oral anticoagulant   Adalberto Cole, PharmD, BCPS 06/25/2023 9:02 AM

## 2023-06-25 NOTE — Plan of Care (Signed)
  Problem: Pain Managment: Goal: General experience of comfort will improve Outcome: Progressing   Problem: Safety: Goal: Ability to remain free from injury will improve Outcome: Progressing   Problem: Skin Integrity: Goal: Risk for impaired skin integrity will decrease Outcome: Progressing   

## 2023-06-25 NOTE — Progress Notes (Signed)
Rounding Note    Patient Name: Carrie Guzman Date of Encounter: 06/25/2023  [facility] Cardiologist: New  Subjective   Slight improvement in breathing  Inpatient Medications    Scheduled Meds:  Chlorhexidine Gluconate Cloth  6 each Topical Daily   rosuvastatin  10 mg Oral Daily   Continuous Infusions:  heparin 1,200 Units/hr (06/24/23 1628)   PRN Meds: acetaminophen **OR** acetaminophen, guaiFENesin, hydrALAZINE, ipratropium-albuterol, metoprolol tartrate, ondansetron **OR** ondansetron (ZOFRAN) IV, mouth rinse, senna-docusate, traZODone   Vital Signs    Vitals:   06/24/23 1243 06/24/23 1944 06/25/23 0419 06/25/23 0500  BP: (!) 126/55 118/62 (!) 155/64   Pulse: 63 60 (!) 59   Resp:  20 16   Temp: (!) 97.4 F (36.3 C) 97.7 F (36.5 C) 98.1 F (36.7 C)   TempSrc: Oral Oral Oral   SpO2: 99% 100% 95%   Weight:    119.1 kg  Height:        Intake/Output Summary (Last 24 hours) at 06/25/2023 0748 Last data filed at 06/25/2023 0420 Gross per 24 hour  Intake 341.19 ml  Output 2200 ml  Net -1858.81 ml      06/25/2023    5:00 AM 06/24/2023    5:00 AM 06/23/2023    4:31 AM  Last 3 Weights  Weight (lbs) 262 lb 9.1 oz 268 lb 1.3 oz 269 lb 12.8 oz  Weight (kg) 119.1 kg 121.6 kg 122.38 kg      Telemetry    SR, PACs - Personally Reviewed  ECG    N/a - Personally Reviewed  Physical Exam   GEN: No acute distress.   Neck: + JVD Cardiac: RRR, no murmurs, rubs, or gallops.  Respiratory: crackles bilateral bases GI: Soft, nontender, non-distended  MS: 1+ bilateral LE edema Neuro:  Nonfocal  Psych: Normal affect   Labs    High Sensitivity Troponin:   Recent Labs  Lab 06/21/23 1153 06/21/23 1331  TROPONINIHS 22* 27*     Chemistry Recent Labs  Lab 06/23/23 0549 06/24/23 0038 06/25/23 0507  NA 141 143 144  K 4.1 3.4* 3.7  CL 106 109 106  CO2 26 27 30   GLUCOSE 99 112* 92  BUN 23 23 20   CREATININE 0.45 0.61 0.58  CALCIUM 9.9 9.6  9.9  MG 1.9 2.1 1.9  PROT 5.7* 5.7* 6.2*  ALBUMIN 2.8* 2.7* 2.9*  AST 28 17 17   ALT 17 14 16   ALKPHOS 76 79 84  BILITOT 3.3* 3.1* 3.8*  GFRNONAA >60 >60 >60  ANIONGAP 9 7 8     Lipids No results for input(s): "CHOL", "TRIG", "HDL", "LABVLDL", "LDLCALC", "CHOLHDL" in the last 168 hours.  Hematology Recent Labs  Lab 06/23/23 0549 06/24/23 0038 06/25/23 0507  WBC 8.8 6.5 7.5  RBC 4.40 4.36 4.62  HGB 14.3 14.2 14.9  HCT 44.9 46.1* 48.8*  MCV 102.0* 105.7* 105.6*  MCH 32.5 32.6 32.3  MCHC 31.8 30.8 30.5  RDW 17.9* 17.6* 17.7*  PLT 65* 66* 67*   Thyroid  Recent Labs  Lab 06/21/23 1852  TSH 0.252*  FREET4 1.57*    BNP Recent Labs  Lab 06/21/23 1203  BNP 1,771.2*    DDimer No results for input(s): "DDIMER" in the last 168 hours.   Radiology    VAS [place] LOWER EXTREMITY VENOUS (DVT)  Result Date: 06/23/2023  Lower Venous DVT Study Patient Name:  Carrie Guzman  Date of Exam:   06/23/2023 Medical Rec #: [id number]  Accession #:    [id number] Date of Birth: Mar 27, 1946            Patient Gender: F Patient Age:   77 years Exam Location:  [facility] Procedure:      VAS [place] LOWER EXTREMITY VENOUS (DVT) Referring Phys: Stephania Fragmin --------------------------------------------------------------------------------  Indications: Pulmonary embolism, and chronic leg swelling.  Limitations: Body habitus and poor ultrasound/tissue interface. Comparison Study: No previous study. Performing Technologist: McKayla Maag RVT, [place]  Examination Guidelines: A complete evaluation includes B-mode imaging, spectral Doppler, color Doppler, and power Doppler as needed of all accessible portions of each vessel. Bilateral testing is considered an integral part of a complete examination. Limited examinations for reoccurring indications may be performed as noted. The reflux portion of the exam is performed with the patient in reverse Trendelenburg.   +--------+---------------+---------+-----------+----------+--------------------+ RIGHT   CompressibilityPhasicitySpontaneityPropertiesThrombus Aging       +--------+---------------+---------+-----------+----------+--------------------+ CFV     Full           Yes      Yes                                       +--------+---------------+---------+-----------+----------+--------------------+ SFJ     Full                                                              +--------+---------------+---------+-----------+----------+--------------------+ FV Prox Full                                                              +--------+---------------+---------+-----------+----------+--------------------+ FV Mid                 Yes      Yes                  Patent by color                                                           doppler              +--------+---------------+---------+-----------+----------+--------------------+ FV                                                   Not visualized       Distal                                                                    +--------+---------------+---------+-----------+----------+--------------------+ PFV  Full                                                              +--------+---------------+---------+-----------+----------+--------------------+ POP     Full           Yes      Yes                                       +--------+---------------+---------+-----------+----------+--------------------+ PTV                                                  Not visualized       +--------+---------------+---------+-----------+----------+--------------------+ PERO                                                 Not visualized       +--------+---------------+---------+-----------+----------+--------------------+   +--------+---------------+---------+-----------+----------+--------------------+  LEFT    CompressibilityPhasicitySpontaneityPropertiesThrombus Aging       +--------+---------------+---------+-----------+----------+--------------------+ CFV     Full           Yes      Yes                                       +--------+---------------+---------+-----------+----------+--------------------+ SFJ     Full                                                              +--------+---------------+---------+-----------+----------+--------------------+ FV Prox Full                                                              +--------+---------------+---------+-----------+----------+--------------------+ FV Mid                 Yes      Yes                  Patent by color                                                           doppler              +--------+---------------+---------+-----------+----------+--------------------+ FV  Not visualized       Distal                                                                    +--------+---------------+---------+-----------+----------+--------------------+ PFV     Full                                                              +--------+---------------+---------+-----------+----------+--------------------+ POP     Full           Yes      Yes                                       +--------+---------------+---------+-----------+----------+--------------------+ PTV                                                  Not visualized       +--------+---------------+---------+-----------+----------+--------------------+ PERO                                                 Not visualized       +--------+---------------+---------+-----------+----------+--------------------+     Summary: RIGHT: - There is no evidence of deep vein thrombosis in the lower extremity. However, portions of this examination were limited- see technologist  comments above.  - No cystic structure found in the popliteal fossa.  LEFT: - There is no evidence of deep vein thrombosis in the lower extremity. However, portions of this examination were limited- see technologist comments above.  - No cystic structure found in the popliteal fossa.  *See table(s) above for measurements and observations. Electronically signed by Lemar Livings MD on 06/23/2023 at 7:10:43 PM.    Final     Cardiac Studies    Patient Profile     77 y.o. female with a hx of lymphedema, htn, obesity (BMI 46), HLD who is being seen 06/22/2023 for the evaluation of pulmonary HTN at the request of Dr. Nelson Chimes.   Assessment & Plan    1.Pulmonary embolism/HTN/RV failure - CT PE RUL segmental and subsegmental PE, right heart strain.  - 06/2023 echo: LVEF 60-65%, grade I dd, RV volume and pressure overload with D shaped septum, severe RV dysfunction and enlargement, PASP 94. Mod pericardial effusion   -suspect pulm HTN multifactorial due to PE, group II left sided diastolic HF, likely undiagnosed OSA/OHS from morbid obesity.  - will need outpatient sleep study, PFTs. - ANA, anti dna, anti scleroderma negative - seen by pulmonary, thought PE likely small contributor to pulm HTN given size - anticoag per primary team and heme/onc for PE   -diuresis previously limited due to soft bp's. Currenty normotensive to hypertensive.  -negative 1.8 yesterday, negative 6.6 L since admission.  She received IV lasix 40mg  x 1 yesterday, renal function is stable. Redose IV lasix 40mg  x 1 today, gradual diuresis in setting of RV failure, for this reason I have not scheduled diuretic, assess tomorrow and redose as tolerated.  -RHC when more euvolemic and further out from acute PE     2.Pericardial effusion -likely secondary to severe pulm HTN. Mildly hyperthyroid less likely. Inflammatory markers were not elevated.  - moderate by echo without evidence of tamponade - repeat echo 2 weeks or if change  hemodynamically   3.Hyperthyroid - per primary team.    4. Thrombocytopenia - per heme/onc  For questions or updates, please contact [facility] Please consult [email or web address] for contact info under        Signed, Dina Rich, MD  06/25/2023, 7:48 AM

## 2023-06-25 NOTE — Plan of Care (Signed)

## 2023-06-25 NOTE — Progress Notes (Signed)
PROGRESS NOTE    Carrie Guzman  HKV:[id number] DOB: [birth year] DOA: 06/21/2023 PCP: Melida Quitter, MD    Brief Narrative:  77 year old with history of lymphedema, HTN, obesity admitted to the hospital with concerns of shortness of breath ongoing for 2-3 weeks.  Patient was diagnosed with CHF exacerbation.  She had elevated BNP, total bilirubin was elevated.  Upon admission started on diuretics.  Echocardiogram showed preserved EF with elevated PASP, cor pulmonale and pericardial effusion without tamponade.  CTA chest showed acute pulmonary embolism but right side heart strain, case discussed with pulmonary who recommended heparin otherwise no indication for thrombectomy at this time.  Initial plan was to transfer patient to [facility] for RHC but given acute pulmonary embolism, will monitor the patient     Assessment & Plan:  Principal Problem:   Acute CHF (congestive heart failure) (HCC) Active Problems:   Acute systolic (congestive) heart failure (HCC)   Acute respiratory distress with mild hypoxia Acute congestive heart failure, preserved ejection fraction" pulmonology, EF 60% Pericardial effusion without tamponade -Patient certainly has right-sided heart failure and signs of peripheral volume overload but given high risk of hypotension holding off on diuretics at this time.  Patient seen by cardiology, eventually will need RHC once euvolemic.  Repeat echo in 2 weeks.  1 more dose of IV Lasix today   Acute right upper lobe and subsegmental pulmonary embolism without cor pulmonale Moderate pleural effusion, left - Seen by pulmonary, no indication for thrombectomy at this time.  Continue anticoagulation.  2 extremity Dopplers negative for DVT.  Will need outpatient sleep study.  Thrombocytopenia - Platelets drifted down but now they are stable around 66.  Continue anticoagulation.  Seen by hematology.  No need to hold anticoagulation unless if she develops bleeding or  platelets are below 30.  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  Will need to repeat in next 3-4 weeks   Elevated total bilirubin, improving - Unclear etiology.  Mild hepatic congestion secondary to right-sided RV dysfunction   Lymphedema -this appears to be chronic   Hypokalemia -Replete as needed   Hyperlipidemia -continue home Crestor    Hypertension -c currently on Coreg.  IV as needed   Morbid obesity-BMI 42.9, complicating all aspects of care    PT/OT-home health, face-to-face completed   DVT prophylaxis: Heparin drip Code Status: Full code Family Communication: Husband present at bedside Status is: Inpatient Remains inpatient appropriate because: Ongoing management for acute pulmonary embolism and CHF                 Diet Orders (From admission, onward)     Start     Ordered   06/24/23 0815  Diet Heart Room service appropriate? Yes; Fluid consistency: Thin  Diet effective now       Question Answer Comment  Room service appropriate? Yes   Fluid consistency: Thin      06/24/23 0815            Subjective: At rest she is feeling okay, no complaints at this time Does admit that she gets dyspnea on exertion.  She is also been coughing a little bit more since using incentive spirometer   Examination:  General exam: Appears calm and comfortable  Respiratory system: Clear to auscultation. Respiratory effort normal. Cardiovascular system: S1 & S2 heard, RRR. No JVD, murmurs, rubs, gallops or clicks. No pedal edema. Gastrointestinal system: Abdomen is  nondistended, soft and nontender. No organomegaly or masses felt. Normal bowel sounds heard. Central nervous system: Alert and oriented. No focal neurological deficits. Extremities: Symmetric 5 x 5 power. Skin: No rashes, lesions or ulcers Psychiatry: Judgement and insight appear normal. Mood & affect appropriate.  Objective: Vitals:    06/24/23 1243 06/24/23 1944 06/25/23 0419 06/25/23 0500  BP: (!) 126/55 118/62 (!) 155/64   Pulse: 63 60 (!) 59   Resp:  20 16   Temp: (!) 97.4 F (36.3 C) 97.7 F (36.5 C) 98.1 F (36.7 C)   TempSrc: Oral Oral Oral   SpO2: 99% 100% 95%   Weight:    119.1 kg  Height:        Intake/Output Summary (Last 24 hours) at 06/25/2023 1150 Last data filed at 06/25/2023 0420 Gross per 24 hour  Intake 105.19 ml  Output 2200 ml  Net -2094.81 ml   Filed Weights   06/23/23 0431 06/24/23 0500 06/25/23 0500  Weight: 122.4 kg 121.6 kg 119.1 kg    Scheduled Meds:  Chlorhexidine Gluconate Cloth  6 each Topical Daily   rosuvastatin  10 mg Oral Daily   Continuous Infusions:  heparin 1,200 Units/hr (06/24/23 1628)    Nutritional status     Body mass index is 45.07 kg/m.  Data Reviewed:   CBC: Recent Labs  Lab 06/21/23 1153 06/21/23 1332 06/23/23 0549 06/24/23 0038 06/25/23 0507  WBC 7.6  --  8.8 6.5 7.5  HGB 15.9* 15.3* 14.3 14.2 14.9  HCT 49.2* 45.0 44.9 46.1* 48.8*  MCV 100.8*  --  102.0* 105.7* 105.6*  PLT 111*  --  65* 66* 67*   Basic Metabolic Panel: Recent Labs  Lab 06/21/23 1153 06/21/23 1332 06/22/23 0452 06/23/23 0549 06/24/23 0038 06/25/23 0507  NA 144 146* 144 141 143 144  K 4.3 3.1* 3.6 4.1 3.4* 3.7  CL 109  --  109 106 109 106  CO2 22  --  29 26 27 30   GLUCOSE 131*  --  92 99 112* 92  BUN 28*  --  28* 23 23 20   CREATININE 0.83  --  0.80 0.45 0.61 0.58  CALCIUM 11.5*  --  10.2 9.9 9.6 9.9  MG  --   --   --  1.9 2.1 1.9  PHOS  --   --   --  2.1*  --   --    GFR: Estimated Creatinine Clearance: 77.2 mL/min (by C-G formula based on SCr of 0.58 mg/dL). Liver Function Tests: Recent Labs  Lab 06/21/23 1153 06/23/23 0549 06/24/23 0038 06/25/23 0507  AST 43* 28 17 17   ALT 14 17 14 16   ALKPHOS 77 76 79 84  BILITOT 5.8* 3.3* 3.1* 3.8*  PROT 7.5 5.7* 5.7* 6.2*  ALBUMIN 4.0 2.8* 2.7* 2.9*   No results for input(s): "LIPASE", "AMYLASE" in the last  168 hours. Recent Labs  Lab 06/21/23 1331  AMMONIA 24   Coagulation Profile: Recent Labs  Lab 06/21/23 1153  INR 1.3*   Cardiac Enzymes: No results for input(s): "CKTOTAL", "CKMB", "CKMBINDEX", "TROPONINI" in the last 168 hours. BNP (last 3 results) No results for input(s): "PROBNP" in the last 8760 hours. HbA1C: No results for input(s): "HGBA1C" in the last 72 hours. CBG: Recent Labs  Lab 06/23/23 0811 06/23/23 1140 06/23/23 1631 06/23/23 2056 06/24/23 2022  GLUCAP 98 102* 98 135* 111*   Lipid Profile: No results for input(s): "CHOL", "HDL", "LDLCALC", "TRIG", "CHOLHDL", "LDLDIRECT" in the last 72 hours. Thyroid Function  Tests: No results for input(s): "TSH", "T4TOTAL", "FREET4", "T3FREE", "THYROIDAB" in the last 72 hours. Anemia Panel: Recent Labs    06/25/23 0507  VITAMINB12 561  FOLATE 13.5   Sepsis Labs: No results for input(s): "PROCALCITON", "LATICACIDVEN" in the last 168 hours.  Recent Results (from the past 240 hour(s))  SARS Coronavirus 2 by RT PCR (hospital order, performed in [facility] hospital lab) *cepheid single result test* Anterior Nasal Swab     Status: None   Collection Time: 06/21/23 12:07 PM   Specimen: Anterior Nasal Swab  Result Value Ref Range Status   SARS Coronavirus 2 by RT PCR NEGATIVE NEGATIVE Final    Comment: (NOTE) SARS-CoV-2 target nucleic acids are NOT DETECTED.  The SARS-CoV-2 RNA is generally detectable in upper and lower respiratory specimens during the acute phase of infection. The lowest concentration of SARS-CoV-2 viral copies this assay can detect is 250 copies / mL. A negative result does not preclude SARS-CoV-2 infection and should not be used as the sole basis for treatment or other patient management decisions.  A negative result may occur with improper specimen collection / handling, submission of specimen other than nasopharyngeal swab, presence of viral mutation(s) within the areas targeted by this assay, and  inadequate number of viral copies (<250 copies / mL). A negative result must be combined with clinical observations, patient history, and epidemiological information.  Fact Sheet for Patients:   RoadLapTop.co.za  Fact Sheet for Healthcare Providers: http://kim-miller.com/  This test is not yet approved or  cleared by the [place] FDA and has been authorized for detection and/or diagnosis of SARS-CoV-2 by FDA under an Emergency Use Authorization (EUA).  This EUA will remain in effect (meaning this test can be used) for the duration of the COVID-19 declaration under Section 564(b)(1) of the Act, 21 U.S.C. section 360bbb-3(b)(1), unless the authorization is terminated or revoked sooner.  Performed at [facility], [address] [place], [place] [address]          Radiology Studies: No results found.         LOS: 4 days   Time spent= 35 mins    Miguel Rota, MD [facility]  If 7PM-7AM, please contact night-coverage  06/25/2023, 11:50 AM

## 2023-06-26 ENCOUNTER — Ambulatory Visit: Payer: Medicare Other

## 2023-06-26 ENCOUNTER — Other Ambulatory Visit (HOSPITAL_COMMUNITY): Payer: Self-pay

## 2023-06-26 DIAGNOSIS — I509 Heart failure, unspecified: Secondary | ICD-10-CM | POA: Diagnosis not present

## 2023-06-26 DIAGNOSIS — I5031 Acute diastolic (congestive) heart failure: Secondary | ICD-10-CM | POA: Diagnosis not present

## 2023-06-26 DIAGNOSIS — J9601 Acute respiratory failure with hypoxia: Secondary | ICD-10-CM

## 2023-06-26 DIAGNOSIS — I2609 Other pulmonary embolism with acute cor pulmonale: Secondary | ICD-10-CM | POA: Diagnosis not present

## 2023-06-26 DIAGNOSIS — I5032 Chronic diastolic (congestive) heart failure: Secondary | ICD-10-CM

## 2023-06-26 LAB — CBC
HCT: 46.6 % — ABNORMAL HIGH (ref 36.0–46.0)
Hemoglobin: 14.4 g/dL (ref 12.0–15.0)
MCH: 32.6 pg (ref 26.0–34.0)
MCHC: 30.9 g/dL (ref 30.0–36.0)
MCV: 105.4 fL — ABNORMAL HIGH (ref 80.0–100.0)
Platelets: 68 10*3/uL — ABNORMAL LOW (ref 150–400)
RBC: 4.42 MIL/uL (ref 3.87–5.11)
RDW: 17.4 % — ABNORMAL HIGH (ref 11.5–15.5)
WBC: 7.7 10*3/uL (ref 4.0–10.5)
nRBC: 0 % (ref 0.0–0.2)

## 2023-06-26 LAB — COMPREHENSIVE METABOLIC PANEL
ALT: 15 U/L (ref 0–44)
AST: 17 U/L (ref 15–41)
Albumin: 2.7 g/dL — ABNORMAL LOW (ref 3.5–5.0)
Alkaline Phosphatase: 84 U/L (ref 38–126)
Anion gap: 6 (ref 5–15)
BUN: 17 mg/dL (ref 8–23)
CO2: 32 mmol/L (ref 22–32)
Calcium: 9.8 mg/dL (ref 8.9–10.3)
Chloride: 105 mmol/L (ref 98–111)
Creatinine, Ser: 0.53 mg/dL (ref 0.44–1.00)
GFR, Estimated: >60 mL/min (ref >60)
Glucose, Bld: 97 mg/dL (ref 70–99)
Potassium: 3.8 mmol/L (ref 3.5–5.1)
Sodium: 143 mmol/L (ref 135–145)
Total Bilirubin: 3.8 mg/dL — ABNORMAL HIGH (ref 0.3–1.2)
Total Protein: 5.9 g/dL — ABNORMAL LOW (ref 6.5–8.1)

## 2023-06-26 LAB — MAGNESIUM: Magnesium: 1.9 mg/dL (ref 1.7–2.4)

## 2023-06-26 LAB — HEPARIN LEVEL (UNFRACTIONATED): Heparin Unfractionated: 0.29 [IU]/mL — ABNORMAL LOW (ref 0.30–0.70)

## 2023-06-26 MED ORDER — FUROSEMIDE 10 MG/ML IJ SOLN
40.0000 mg | Freq: Once | INTRAMUSCULAR | Status: AC
  Administered 2023-06-26: 40 mg via INTRAVENOUS
  Filled 2023-06-26: qty 4

## 2023-06-26 MED ORDER — APIXABAN 5 MG PO TABS: 5.0000 mg | ORAL_TABLET | Freq: Two times a day (BID) | ORAL | Status: DC

## 2023-06-26 MED ORDER — APIXABAN 5 MG PO TABS
10.0000 mg | ORAL_TABLET | Freq: Two times a day (BID) | ORAL | Status: DC
  Administered 2023-06-26 – 2023-06-28 (×5): 10 mg via ORAL
  Filled 2023-06-26 (×5): qty 2

## 2023-06-26 NOTE — Consult Note (Signed)
NAME:  Nykera Runkel, MRN:  [id number], DOB:  1946-09-21, LOS: 5 ADMISSION DATE:  06/21/2023, CONSULTATION DATE:  06/23/2023 REFERRING MD:  Stephania Fragmin - TRH , CHIEF COMPLAINT:  PE   History of Present Illness:  Carroll Isabella is a 77yo female with a past medical history significant for HTN, HLD, squamous cell skin cancer, and obesity who presented to the ED 8/14 for worsening lower extremity swelling, dyspnea, and cough.  Workup on admission revealed elevated bilirubin, BNP and elevated troponin.  Patient was admitted per hospitalist for further management of acute failure.   Echocardiogram was completed on arrival and revealed preserved EF of 60 to 65% and no WMA but revealed grade 1 diastolic function with severe pulmonary artery hypertension with severe RA dilation and dysfunction consistent with cor pulmonale.  CTA chest was then obtained  which revealed right upper lobe segmental and subsegmental pulmonary emboli with CT evidence of right heart strain, large pericardial effusion, and moderate right pleural effusion.  Pulmonary critical care consulted for valuation of potential intervention to PE.   Pertinent  Medical History  HTN, HLD, squamous cell skin cancer, and obesity  Significant Hospital Events: Including procedures, antibiotic start and stop dates in addition to other pertinent events   8/15 presented with dyspnea, lower extremity edema, and cough found to be in acute right heart failure 8/16 CTA chest overnight consistent with acute PE, PCCM consulted for assistance in management  Interim History / Subjective:   Remains on 2-4L since admission Working on IS this am  Objective   Blood pressure (!) 126/53, pulse 76, temperature 97.7 F (36.5 C), temperature source Oral, resp. rate 20, height 5\' 4"  (1.626 m), weight 119.1 kg, SpO2 95%.        Intake/Output Summary (Last 24 hours) at 06/26/2023 0843 Last data filed at 06/26/2023 0648 Gross per 24 hour  Intake  1175.85 ml  Output 2600 ml  Net -1424.15 ml   Filed Weights   06/23/23 0431 06/24/23 0500 06/25/23 0500  Weight: 122.4 kg 121.6 kg 119.1 kg    Physical Exam: General: Chronically ill-appearing, no acute distress HENT: [place], AT Eyes: EOMI, no scleral icterus Respiratory: Diminished but clear to auscultation bilaterally.  No crackles, wheezing or rales Cardiovascular: RRR, -M/R/G, no JVD Extremities: Lymphedema with 2+ pitting edema,-tenderness Neuro: AAO x4, CNII-XII grossly intact Psych: Normal mood, normal affect  LE dopplers - neg for DVT  Resolved Hospital Problem list     Assessment & Plan:  Acute right upper lobe segmental and subsegmental pulmonary emboli - CTA chest on admit revealed RUL segmental and subsegmental pulmonary emboli with CT evidence of right heart strain -Patient's underlying lymphedema and venous stasis ulcers Limited mobility, this is likely contributing factor to PE Severe pulmonary hypertension with cor pulmonale -Multifactorial from heart failure, undiagnosed OSA/OHS, and PE Acute HFpEF/right-sided heart failure -2D echocardiogram obtained 8/15 revealed preserved EF of 60 to 65% with no WMA but revealed severe pulmonary hypertension with severe RV dilation and dysfunction consistent with cor pulmonale Moderate pericardial effusion -This is confirmed on 2D echocardiogram Lymphedema Thrombocytopenia - stable in 60s P: No indication for thrombolytics. Not a candidate for thrombectomy based on location of PEs Continue heparin gtt. Transition to DOAC when able Wean oxygen for goal >90% Cardiology following for right heart failure. Planning for RHC when further out from acute PE Currently diuresing as able due to low-normal pressures Continuous telemetry  Strict intake and output  Daily assessment for need to diurese per  primary/cardiology Closely monitor renal function and electrolytes  NIV with BIPAP thereapy may be indicated, scheduled with Dr.  Aldean Ast on 8/28 for evaluation for sleep study  Best Practice (right click and "Reselect all SmartList Selections" daily)  Per primary   Critical care time: n/a   Care Time: 25 min  Will see again on 06/28/23. Call if needed sooner  Mechele Collin, M.D. [facility] Pulmonary/Critical Care Medicine 06/26/2023 8:43 AM   See Amion for personal pager For hours between 7 PM to 7 AM, please call Elink for urgent questions

## 2023-06-26 NOTE — TOC Progression Note (Addendum)
Transition of Care [facility]) - Progression Note    Patient Details  Name: Carrie Guzman MRN: [id number] Date of Birth: 1946-09-30  Transition of Care [facility]) CM/SW Contact  Kiasia Chou, Olegario Messier, RN Phone Number: 06/26/2023, 1:07 PM  Clinical Narrative: awaiting documented home 02 sats if home 02 needed, & order-MD notified.[employer] rep Jermaine to deliver to rm prior d/c if ordered.      Expected Discharge Plan: Home w Home Health Services Barriers to Discharge: Continued Medical Work up  Expected Discharge Plan and Services   Discharge Planning Services: CM Consult   Living arrangements for the past 2 months: Single Family Home                 DME Arranged: Walker rolling DME Agency: [employer] Date DME Agency Contacted: 06/23/23 Time DME Agency Contacted: 1504 Representative spoke with at DME Agency: Vaughan Basta HH Arranged: PT HH Agency: [facility] Date [facility] Agency Contacted: 06/23/23 Time HH Agency Contacted: 1504 Representative spoke with at [facility] Agency: Elnita Maxwell   Social Determinants of Health (SDOH) Interventions SDOH Screenings   Food Insecurity: No Food Insecurity (06/21/2023)  Housing: Low Risk  (06/21/2023)  Transportation Needs: No Transportation Needs (06/21/2023)  Utilities: Not At Risk (06/21/2023)  Tobacco Use: Low Risk  (06/21/2023)  Recent Concern: Tobacco Use - Medium Risk (06/07/2023)   Received from [facility]    Readmission Risk Interventions     No data to display

## 2023-06-26 NOTE — Progress Notes (Addendum)
Patient Name: Carrie Guzman Date of Encounter: 06/26/2023 [facility] Cardiologist: Armanda Magic, MD   Interval Summary  .    Patient reports that her breathing has improved but has not yet returned to normal. Denies dizziness, syncope, near syncope. Denies abdominal distention, but does feel like she gets full quickly when she eats. Husband is concerned that he will not be able to fully take care of patient when she returns home   Vital Signs .    Vitals:   06/25/23 1216 06/25/23 2005 06/25/23 2018 06/26/23 0430  BP: 136/78 (!) 132/48  (!) 126/53  Pulse: 68 74 69 76  Resp: 20 17  20   Temp: 97.9 F (36.6 C) 97.8 F (36.6 C)  97.7 F (36.5 C)  TempSrc: Oral Oral  Oral  SpO2: 96% (!) 89% 96% 95%  Weight:      Height:        Intake/Output Summary (Last 24 hours) at 06/26/2023 0910 Last data filed at 06/26/2023 0648 Gross per 24 hour  Intake 1175.85 ml  Output 2600 ml  Net -1424.15 ml      06/25/2023    5:00 AM 06/24/2023    5:00 AM 06/23/2023    4:31 AM  Last 3 Weights  Weight (lbs) 262 lb 9.1 oz 268 lb 1.3 oz 269 lb 12.8 oz  Weight (kg) 119.1 kg 121.6 kg 122.38 kg      Telemetry/ECG    Sinus with PVCs - Personally Reviewed  Physical Exam .   GEN: No acute distress. Sitting upright in the bed in no acute distress. Wearing [name]   Neck: No JVD Cardiac: RRR. Grade 2/6 systolic murmur at RUSB  Respiratory: Clear to auscultation bilaterally. GI: Soft, nontender, non-distended  MS: No edema  Assessment & Plan .     Right sided heart failure  Severe Pulmonary HTN  - Echocardiogram this admission showed EF 60-65%, no regional wall motion abnormalities, grade I DD, severe pulmonary HTN with severe RV dilation and dysfunction consistent with cor pulmonale. There also is a moderate pericardial effusion without tamponade  - She presented with BNP elevated to 1771 and CXR showing cardiomegaly, pulmonary vascular congestion, and interstitial edema  - She has  been on IV lasix 40 mg daily- output 2.6 L urine yesterday and is net-8 L since admission. Renal function stable  - Continue IV lasix today- hopefully can transition to PO tomorrow. Body habitus does make it difficulty to assess volume status  - Pulm following- suspected that her pulmonary HTN is secondary to left sided diastolic heart failure, undiagnosed OSA/OHS, and PE  - ANA, anti DNA, anti scleroderma negative  - Patient needs outpatient sleep study and PFTs - Plan for RHC as an outpatient when patient is further out from acute PE   Moderate pericardial effusion  - Noted on echocardiogram this admission - no signs of tamponade  - Patient has been normotensive yesterday and today  - Likely secondary to severe pulmonary HTN. Inflammatory markers not elevated - Repeat echo in 2 weeks or if there are hemodynamic changes.   PE  - Patient was found to have an acute right upper lobe segmental and subsegmental PE with evidence of right heart strain  - on IV heparin for now   Otherwise per primary  - Low TSH, elevated free T4- suspected euthyroid sick syndrome  - Elevated total bilitrubin  - Lymphedema, chronic  - Morbid obesity   For questions or updates, please contact [facility] Please consult [email or web address] for contact info under        Signed, Jonita Albee, PA-C   History and all data above reviewed.  Patient examined.  I agree with the findings as above.   She thinks her breathing is improved but not at baseline.    She has been watching her salt.  She denies any new chest pain.  The patient exam reveals COR: Regular rate and rhythm,  Lungs: Clear to auscultation bilaterally,  Abd: Positive bowel sounds normal in frequency in pitch, Ext 2+ pulses, severe bilateral lower extremity edema..  All available labs, radiology testing, previous records reviewed. Agree with documented assessment and plan.  Agree with continued diuresis as she is tolerating this.  We will  follow-up her echocardiogram in a couple of weeks as above.  Currently no evidence of tamponade.   Transition to DOAC for acute pulmonary embolism.  No invasive procedures planned this admission.  Fayrene Fearing Saramarie Stinger  6:10 PM  06/26/2023

## 2023-06-26 NOTE — Discharge Instructions (Addendum)
Information on my medicine - ELIQUIS (apixaban)  This medication education was reviewed with me or my healthcare representative as part of my discharge preparation.  Why was Eliquis prescribed for you? Eliquis was prescribed to treat blood clots that may have been found in the veins of your legs (deep vein thrombosis) or in your lungs (pulmonary embolism) and to reduce the risk of them occurring again.  What do You need to know about Eliquis ? The starting dose is 10 mg (two 5 mg tablets) taken TWICE daily for the FIRST SEVEN (7) DAYS, then on 07/03/23  the dose is reduced to ONE 5 mg tablet taken TWICE daily.  Eliquis may be taken with or without food.   Try to take the dose about the same time in the morning and in the evening. If you have difficulty swallowing the tablet whole please discuss with your pharmacist how to take the medication safely.  Take Eliquis exactly as prescribed and DO NOT stop taking Eliquis without talking to the doctor who prescribed the medication.  Stopping may increase your risk of developing a new blood clot.  Refill your prescription before you run out.  After discharge, you should have regular check-up appointments with your healthcare provider that is prescribing your Eliquis.    What do you do if you miss a dose? If a dose of ELIQUIS is not taken at the scheduled time, take it as soon as possible on the same day and twice-daily administration should be resumed. The dose should not be doubled to make up for a missed dose.  Important Safety Information A possible side effect of Eliquis is bleeding. You should call your healthcare provider right away if you experience any of the following: Bleeding from an injury or your nose that does not stop. Unusual colored urine (red or dark brown) or unusual colored stools (red or black). Unusual bruising for unknown reasons. A serious fall or if you hit your head (even if there is no bleeding).  Some medicines  may interact with Eliquis and might increase your risk of bleeding or clotting while on Eliquis. To help avoid this, consult your healthcare provider or pharmacist prior to using any new prescription or non-prescription medications, including herbals, vitamins, non-steroidal anti-inflammatory drugs (NSAIDs) and supplements.  This website has more information on Eliquis (apixaban): http://www.eliquis.com/eliquis/home

## 2023-06-26 NOTE — Progress Notes (Signed)
ANTICOAGULATION CONSULT NOTE Pharmacy Consult for Heparin  Indication: pulmonary embolus  No Known Allergies  Patient Measurements: Height: 5\' 4"  (162.6 cm) Weight: 119.1 kg (262 lb 9.1 oz) IBW/kg (Calculated) : 54.7 Heparin Dosing Weight: 82 kg  Vital Signs: Temp: 97.7 F (36.5 C) (08/19 0430) Temp Source: Oral (08/19 0430) BP: 126/53 (08/19 0430) Pulse Rate: 76 (08/19 0430)  Labs: Recent Labs    06/24/23 0038 06/25/23 0507 06/26/23 0445  HGB 14.2 14.9 14.4  HCT 46.1* 48.8* 46.6*  PLT 66* 67* 68*  HEPARINUNFRC 0.51 0.38 0.29*  CREATININE 0.61 0.58 0.53    Estimated Creatinine Clearance: 77.2 mL/min (by C-G formula based on SCr of 0.53 mg/dL).   Medications:  Scheduled:   Chlorhexidine Gluconate Cloth  6 each Topical Daily   rosuvastatin  10 mg Oral Daily   Infusions:   heparin 1,200 Units/hr (06/24/23 1628)    Assessment: 76 yoF admitted on 8/14 with CHF exacerbation. 8/15 CT is positive for PE with RH strain; pharmacy consulted to dose heparin. She has been on prophylactic Lovenox 0.5 mg/kg daily - most recent dose on 8/15 at 18:00   Today, 06/26/2023: Heparin level 0.29- now slightly below therapeutic range on IV heparin 1200 units/hr CBC: Hg stable WNL, pltc low but stable around 65  Ok to cont heparin if pltc>30 & not actively bleeding per hematologist No bleeding or infusion related issues reported by RN.   Goal of Therapy:  Heparin level 0.3-0.7 Monitor platelets by anticoagulation protocol: Yes   Plan:  Increase heparin to 1300 units/hr Recheck heparin level 8h after rate change Daily CBC and heparin level F/U plans for RHC, oral anticoagulant   Junita Push, PharmD, BCPS 06/26/2023 6:24 AM

## 2023-06-26 NOTE — Progress Notes (Addendum)
ANTICOAGULATION CONSULT NOTE - Follow Up Consult  Pharmacy Consult for Eliquis Indication: pulmonary embolus  No Known Allergies  Patient Measurements: Height: 5\' 4"  (162.6 cm) Weight: 119.1 kg (262 lb 9.1 oz) IBW/kg (Calculated) : 54.7   Vital Signs: Temp: 97.7 F (36.5 C) (08/19 0430) Temp Source: Oral (08/19 0430) BP: 126/53 (08/19 0430) Pulse Rate: 76 (08/19 0430)  Labs: Recent Labs    06/24/23 0038 06/25/23 0507 06/26/23 0445  HGB 14.2 14.9 14.4  HCT 46.1* 48.8* 46.6*  PLT 66* 67* 68*  HEPARINUNFRC 0.51 0.38 0.29*  CREATININE 0.61 0.58 0.53    Estimated Creatinine Clearance: 77.2 mL/min (by C-G formula based on SCr of 0.53 mg/dL).   Assessment: 35 YOF admitted on 8/14 with CHF exacerbation. 8/15 CT positive for PE with RH strain. Heparin per pharmacy since 8/16. Now switching to Eliquis.  Heme/Onc and MD aware of new thrombocytopenia. Monitor hemoglobin and platelets.  Goal of Therapy:  Therapeutic oral anticoagulation Monitor platelets by anticoagulation protocol: Yes   Plan:  Eliquis 10 mg PO BID x 7 days followed by 5 mg PO BID Discontinue the heparin drip and administer the first dose of Eliquis now.  Clydell Hakim 06/26/2023,11:45 AM   I discussed / reviewed the pharmacy note by Dr. Glenetta Hew, PharmD candidate and I agree with the resident's findings and plans as documented.Copay check in progress.  Judd Mccubbin S. Merilynn Finland, PharmD, BCPS Clinical Staff Pharmacist [email or web address]

## 2023-06-26 NOTE — Progress Notes (Signed)
Physical Therapy Treatment Patient Details Name: Carrie Guzman MRN: [id number] DOB: [birth year] Today's Date: 06/26/2023   History of Present Illness 77 year old with history of lymphedema, HTN, obesity admitted to the hospital with concerns of shortness of breath ongoing for 2-3 weeks.  Patient was diagnosed with CHF exacerbation and severe pulmonary hypertension.  Also found to have acute right upper lobe and subsegmental pulmonary embolism without cor pulmonale.    PT Comments  Pt agreeable to therapy. Pt had difficulty rising from recliner 2* low surface, poor bil knee flexion, poor ability to shift weight anteriorly. She walked a short distance to/from bathroom. O2 90% on 2L, dyspnea 2/4. Will continue to follow and progress activity.     If plan is discharge home, recommend the following: A little help with walking and/or transfers;A little help with bathing/dressing/bathroom;Assist for transportation;Help with stairs or ramp for entrance   Can travel by private vehicle        Equipment Recommendations  Rolling walker (2 wheels)    Recommendations for Other Services       Precautions / Restrictions Precautions Precautions: Fall Precaution Comments: monitor sats Restrictions Weight Bearing Restrictions: No     Mobility  Bed Mobility               General bed mobility comments: oob in recliner    Transfers Overall transfer level: Needs assistance Equipment used: Rolling walker (2 wheels) (recliner in front of patient) Transfers: Sit to/from Stand Sit to Stand: Min assist           General transfer comment: Sit to stand x 3- x 2 with recliner in front of pt for her to pull up on to rise (limited knee flexion and anterior weigthshifting makes rising from low surface difficulty); x 1 from elevated BSC-Min A.    Ambulation/Gait Contact Guard Assist  Gait Distance (Feet): 7 Feet (x2) Assistive device: Rolling walker (2 wheels) Gait Pattern/deviations:  Step-to pattern, Decreased stride length, Trunk flexed       General Gait Details: O2 90% on 2L for very short distance ambulation into/out of bathroom from bedside. Dyspnea 2/4. Increased time with effortful gait   Stairs             Wheelchair Mobility     Tilt Bed    Modified Rankin (Stroke Patients Only)       Balance Overall balance assessment: Needs assistance         Standing balance support: Bilateral upper extremity supported, During functional activity, Reliant on assistive device for balance Standing balance-Leahy Scale: Fair                              Cognition Arousal: Alert Behavior During Therapy: WFL for tasks assessed/performed Overall Cognitive Status: Within Functional Limits for tasks assessed                                          Exercises      General Comments        Pertinent Vitals/Pain Pain Assessment Pain Assessment: No/denies pain    Home Living                          Prior Function            PT Goals (current goals can  now be found in the care plan section) Progress towards PT goals: Progressing toward goals    Frequency    Min 1X/week      PT Plan      Co-evaluation              AM-PAC PT "6 Clicks" Mobility   Outcome Measure  Help needed turning from your back to your side while in a flat bed without using bedrails?: A Lot Help needed moving from lying on your back to sitting on the side of a flat bed without using bedrails?: A Little Help needed moving to and from a bed to a chair (including a wheelchair)?: A Little Help needed standing up from a chair using your arms (e.g., wheelchair or bedside chair)?: A Little Help needed to walk in hospital room?: A Lot Help needed climbing 3-5 steps with a railing? : Total 6 Click Score: 14    End of Session Equipment Utilized During Treatment: Oxygen Activity Tolerance: Patient limited by fatigue Patient  left: in chair;with call bell/phone within reach   PT Visit Diagnosis: Difficulty in walking, not elsewhere classified (R26.2)     Time: 1610-9604 PT Time Calculation (min) (ACUTE ONLY): 30 min  Charges:    $Gait Training: 23-37 mins PT General Charges $$ ACUTE PT VISIT: 1 Visit                        Faye Ramsay, PT Acute Rehabilitation  Office: [phone]

## 2023-06-26 NOTE — Progress Notes (Signed)
PROGRESS NOTE    Carrie Guzman  VHQ:[id number] DOB: 1946-03-31 DOA: 06/21/2023 PCP: Melida Quitter, MD    Brief Narrative:  77 year old with history of lymphedema, HTN, obesity admitted to the hospital with concerns of shortness of breath ongoing for 2-3 weeks.  Patient was diagnosed with CHF exacerbation.  She had elevated BNP, total bilirubin was elevated.  Upon admission started on diuretics.  Echocardiogram showed preserved EF with elevated PASP, cor pulmonale and pericardial effusion without tamponade.  CTA chest showed acute pulmonary embolism but right side heart strain, case discussed with pulmonary who recommended heparin otherwise no indication for thrombectomy at this time.  Initial plan was to transfer patient to [facility] for RHC but given acute pulmonary embolism, will monitor the patient     Assessment & Plan:  Principal Problem:   Acute CHF (congestive heart failure) (HCC) Active Problems:   Acute systolic (congestive) heart failure (HCC)   Acute respiratory distress with mild hypoxia Acute congestive heart failure, preserved ejection fraction" pulmonology, EF 60% Pericardial effusion without tamponade -Patient certainly has right-sided heart failure and signs of peripheral volume overload.e.  Patient seen by cardiology, eventually will need RHC once euvolemic.  Repeat echo in 2 weeks.  1 more lasix 40mg  iv   Acute right upper lobe and subsegmental pulmonary embolism without cor pulmonale Moderate pleural effusion, left - Seen by pulmonary, no indication for thrombectomy at this time.  Transition to Eliquis, discussed with pulmonary.  2 extremity Dopplers negative for DVT.  Will need outpatient sleep study.  Thrombocytopenia - Platelets drifted down but now they are stable around 66.  Continue anticoagulation.  Seen by hematology.  No need to hold anticoagulation unless if she develops bleeding or platelets are below 30.  Macrocytosis - B12 folate are  normal   Low TSH and elevated free T4 - Likely euthyroid sick syndrome.  Will need to repeat in next 3-4 weeks   Elevated total bilirubin, improving - Unclear etiology.  Mild hepatic congestion secondary to right-sided RV dysfunction   Lymphedema -this appears to be chronic   Hypokalemia -Replete as needed   Hyperlipidemia -continue home Crestor    Hypertension -c currently on Coreg.  IV as needed   Morbid obesity -BMI 42.9, complicating all aspects of care    PT/OT-home health, face-to-face completed   DVT prophylaxis: Heparin drip Code Status: Full code Family Communication: Husband present at bedside Status is: Inpatient Remains inpatient appropriate because: Ongoing management for acute pulmonary embolism and CHF                 Diet Orders (From admission, onward)     Start     Ordered   06/24/23 0815  Diet Heart Room service appropriate? Yes; Fluid consistency: Thin  Diet effective now       Question Answer Comment  Room service appropriate? Yes   Fluid consistency: Thin      06/24/23 0815            Subjective: Feeling little better, no new complaints.   Examination:  General exam: Appears calm and comfortable  Respiratory system: Clear to auscultation. Respiratory effort normal. Cardiovascular system: S1 & S2 heard, RRR. No JVD, murmurs, rubs, gallops or clicks. No pedal edema. Gastrointestinal system: Abdomen is nondistended, soft and nontender. No organomegaly or masses felt. Normal bowel sounds heard. Central nervous system: Alert and oriented. No focal neurological deficits. Extremities: Symmetric 5 x 5 power. Skin: No rashes, lesions or ulcers Psychiatry: Judgement  and insight appear normal. Mood & affect appropriate.  Objective: Vitals:   06/25/23 1216 06/25/23 2005 06/25/23 2018 06/26/23 0430  BP: 136/78 (!) 132/48  (!) 126/53  Pulse: 68 74 69 76  Resp: 20 17  20   Temp: 97.9 F (36.6 C) 97.8 F (36.6 C)  97.7 F (36.5  C)  TempSrc: Oral Oral  Oral  SpO2: 96% (!) 89% 96% 95%  Weight:      Height:        Intake/Output Summary (Last 24 hours) at 06/26/2023 1142 Last data filed at 06/26/2023 0900 Gross per 24 hour  Intake 1475.85 ml  Output 2600 ml  Net -1124.15 ml   Filed Weights   06/23/23 0431 06/24/23 0500 06/25/23 0500  Weight: 122.4 kg 121.6 kg 119.1 kg    Scheduled Meds:  Chlorhexidine Gluconate Cloth  6 each Topical Daily   rosuvastatin  10 mg Oral Daily   Continuous Infusions:  heparin 1,300 Units/hr (06/26/23 0707)    Nutritional status     Body mass index is 45.07 kg/m.  Data Reviewed:   CBC: Recent Labs  Lab 06/21/23 1153 06/21/23 1332 06/23/23 0549 06/24/23 0038 06/25/23 0507 06/26/23 0445  WBC 7.6  --  8.8 6.5 7.5 7.7  HGB 15.9* 15.3* 14.3 14.2 14.9 14.4  HCT 49.2* 45.0 44.9 46.1* 48.8* 46.6*  MCV 100.8*  --  102.0* 105.7* 105.6* 105.4*  PLT 111*  --  65* 66* 67* 68*   Basic Metabolic Panel: Recent Labs  Lab 06/22/23 0452 06/23/23 0549 06/24/23 0038 06/25/23 0507 06/26/23 0445  NA 144 141 143 144 143  K 3.6 4.1 3.4* 3.7 3.8  CL 109 106 109 106 105  CO2 29 26 27 30  32  GLUCOSE 92 99 112* 92 97  BUN 28* 23 23 20 17   CREATININE 0.80 0.45 0.61 0.58 0.53  CALCIUM 10.2 9.9 9.6 9.9 9.8  MG  --  1.9 2.1 1.9 1.9  PHOS  --  2.1*  --   --   --    GFR: Estimated Creatinine Clearance: 77.2 mL/min (by C-G formula based on SCr of 0.53 mg/dL). Liver Function Tests: Recent Labs  Lab 06/21/23 1153 06/23/23 0549 06/24/23 0038 06/25/23 0507 06/26/23 0445  AST 43* 28 17 17 17   ALT 14 17 14 16 15   ALKPHOS 77 76 79 84 84  BILITOT 5.8* 3.3* 3.1* 3.8* 3.8*  PROT 7.5 5.7* 5.7* 6.2* 5.9*  ALBUMIN 4.0 2.8* 2.7* 2.9* 2.7*   No results for input(s): "LIPASE", "AMYLASE" in the last 168 hours. Recent Labs  Lab 06/21/23 1331  AMMONIA 24   Coagulation Profile: Recent Labs  Lab 06/21/23 1153  INR 1.3*   Cardiac Enzymes: No results for input(s): "CKTOTAL",  "CKMB", "CKMBINDEX", "TROPONINI" in the last 168 hours. BNP (last 3 results) No results for input(s): "PROBNP" in the last 8760 hours. HbA1C: No results for input(s): "HGBA1C" in the last 72 hours. CBG: Recent Labs  Lab 06/23/23 0811 06/23/23 1140 06/23/23 1631 06/23/23 2056 06/24/23 2022  GLUCAP 98 102* 98 135* 111*   Lipid Profile: No results for input(s): "CHOL", "HDL", "LDLCALC", "TRIG", "CHOLHDL", "LDLDIRECT" in the last 72 hours. Thyroid Function Tests: No results for input(s): "TSH", "T4TOTAL", "FREET4", "T3FREE", "THYROIDAB" in the last 72 hours. Anemia Panel: Recent Labs    06/25/23 0507  VITAMINB12 561  FOLATE 13.5   Sepsis Labs: No results for input(s): "PROCALCITON", "LATICACIDVEN" in the last 168 hours.  Recent Results (from the past 240 hour(s))  SARS Coronavirus 2 by RT PCR (hospital order, performed in [facility] hospital lab) *cepheid single result test* Anterior Nasal Swab     Status: None   Collection Time: 06/21/23 12:07 PM   Specimen: Anterior Nasal Swab  Result Value Ref Range Status   SARS Coronavirus 2 by RT PCR NEGATIVE NEGATIVE Final    Comment: (NOTE) SARS-CoV-2 target nucleic acids are NOT DETECTED.  The SARS-CoV-2 RNA is generally detectable in upper and lower respiratory specimens during the acute phase of infection. The lowest concentration of SARS-CoV-2 viral copies this assay can detect is 250 copies / mL. A negative result does not preclude SARS-CoV-2 infection and should not be used as the sole basis for treatment or other patient management decisions.  A negative result may occur with improper specimen collection / handling, submission of specimen other than nasopharyngeal swab, presence of viral mutation(s) within the areas targeted by this assay, and inadequate number of viral copies (<250 copies / mL). A negative result must be combined with clinical observations, patient history, and epidemiological information.  Fact Sheet  for Patients:   RoadLapTop.co.za  Fact Sheet for Healthcare Providers: http://kim-miller.com/  This test is not yet approved or  cleared by the [place] FDA and has been authorized for detection and/or diagnosis of SARS-CoV-2 by FDA under an Emergency Use Authorization (EUA).  This EUA will remain in effect (meaning this test can be used) for the duration of the COVID-19 declaration under Section 564(b)(1) of the Act, 21 U.S.C. section 360bbb-3(b)(1), unless the authorization is terminated or revoked sooner.  Performed at [facility], [address] [place], [place] [address]          Radiology Studies: No results found.         LOS: 5 days   Time spent= 35 mins    Miguel Rota, MD [facility]  If 7PM-7AM, please contact night-coverage  06/26/2023, 11:42 AM

## 2023-06-26 NOTE — TOC Progression Note (Signed)
Transition of Care [facility]) - Progression Note    Patient Details  Name: Carrie Guzman MRN: [id number] Date of Birth: 1946-04-23  Transition of [facility]) CM/SW Contact  Othon Guardia, Olegario Messier, RN Phone Number: 06/26/2023, 10:06 AM  Clinical Narrative: Spoke to patient(defers to spouse)/spouse about d/c plans-reminded of HHPT already set up w/[facility]-informed of intermittent services;private duty care if adidtional longer hrs needed for custodial level care;contacted [employer] rep Jermaine about rw, & possible home 02-will notify MD if home 02 needed.Spouse voiced understanding.Has own transportation.      Expected Discharge Plan: Home w Home Health Services Barriers to Discharge: Continued Medical Work up  Expected Discharge Plan and Services   Discharge Planning Services: CM Consult   Living arrangements for the past 2 months: Single Family Home                 DME Arranged: Walker rolling DME Agency: [employer] Date DME Agency Contacted: 06/23/23 Time DME Agency Contacted: 1504 Representative spoke with at DME Agency: Vaughan Basta HH Arranged: PT HH Agency: [facility] Date [facility] Agency Contacted: 06/23/23 Time HH Agency Contacted: 1504 Representative spoke with at [facility] Agency: Elnita Maxwell   Social Determinants of Health (SDOH) Interventions SDOH Screenings   Food Insecurity: No Food Insecurity (06/21/2023)  Housing: Low Risk  (06/21/2023)  Transportation Needs: No Transportation Needs (06/21/2023)  Utilities: Not At Risk (06/21/2023)  Tobacco Use: Low Risk  (06/21/2023)  Recent Concern: Tobacco Use - Medium Risk (06/07/2023)   Received from [facility]    Readmission Risk Interventions     No data to display

## 2023-06-26 NOTE — TOC Benefit Eligibility Note (Signed)
Patient Product/process development scientist completed.    The patient is insured through [employer]. Patient has Medicare and is not eligible for a copay card, but may be able to apply for patient assistance, if available.    Ran test claim for Eliquis 5 mg and the current 30 day co-pay is $30.00.   This test claim was processed through [facility]- copay amounts may vary at other pharmacies due to pharmacy/plan contracts, or as the patient moves through the different stages of their insurance plan.     Roland Earl, CPHT Pharmacy Technician III Certified Patient Advocate [facility] Patient Advocate Team Direct Number: [phone]  Fax: [phone]

## 2023-06-27 ENCOUNTER — Inpatient Hospital Stay (HOSPITAL_COMMUNITY): Payer: Medicare Other

## 2023-06-27 DIAGNOSIS — I5031 Acute diastolic (congestive) heart failure: Secondary | ICD-10-CM | POA: Diagnosis not present

## 2023-06-27 LAB — COMPREHENSIVE METABOLIC PANEL
ALT: 15 U/L (ref 0–44)
AST: 17 U/L (ref 15–41)
Albumin: 2.7 g/dL — ABNORMAL LOW (ref 3.5–5.0)
Alkaline Phosphatase: 87 U/L (ref 38–126)
Anion gap: 6 (ref 5–15)
BUN: 17 mg/dL (ref 8–23)
CO2: 33 mmol/L — ABNORMAL HIGH (ref 22–32)
Calcium: 9.7 mg/dL (ref 8.9–10.3)
Chloride: 102 mmol/L (ref 98–111)
Creatinine, Ser: 0.56 mg/dL (ref 0.44–1.00)
GFR, Estimated: >60 mL/min (ref >60)
Glucose, Bld: 94 mg/dL (ref 70–99)
Potassium: 3.3 mmol/L — ABNORMAL LOW (ref 3.5–5.1)
Sodium: 141 mmol/L (ref 135–145)
Total Bilirubin: 4.1 mg/dL — ABNORMAL HIGH (ref 0.3–1.2)
Total Protein: 5.9 g/dL — ABNORMAL LOW (ref 6.5–8.1)

## 2023-06-27 LAB — MAGNESIUM: Magnesium: 2 mg/dL (ref 1.7–2.4)

## 2023-06-27 MED ORDER — FUROSEMIDE 10 MG/ML IJ SOLN
40.0000 mg | Freq: Two times a day (BID) | INTRAMUSCULAR | Status: DC
  Administered 2023-06-27 – 2023-07-06 (×18): 40 mg via INTRAVENOUS
  Filled 2023-06-27 (×17): qty 4

## 2023-06-27 MED ORDER — FUROSEMIDE 10 MG/ML IJ SOLN
40.0000 mg | Freq: Once | INTRAMUSCULAR | Status: AC
  Filled 2023-06-27: qty 4

## 2023-06-27 MED ORDER — FUROSEMIDE 10 MG/ML IJ SOLN
40.0000 mg | Freq: Once | INTRAMUSCULAR | Status: AC
  Administered 2023-06-27: 40 mg via INTRAVENOUS
  Filled 2023-06-27: qty 4

## 2023-06-27 MED ORDER — POTASSIUM CHLORIDE CRYS ER 20 MEQ PO TBCR
40.0000 meq | EXTENDED_RELEASE_TABLET | ORAL | Status: AC
  Administered 2023-06-27 (×2): 40 meq via ORAL
  Filled 2023-06-27 (×2): qty 2

## 2023-06-27 MED ORDER — POTASSIUM CHLORIDE CRYS ER 20 MEQ PO TBCR: 40.0000 meq | EXTENDED_RELEASE_TABLET | ORAL | Status: DC

## 2023-06-27 NOTE — Evaluation (Signed)
Occupational Therapy Evaluation Patient Details Name: Carrie Guzman MRN: [id number] DOB: 05-11-1946 Today's Date: 06/27/2023   History of Present Illness 77 year old admitted to the hospital with concerns of shortness of breath ongoing for 2-3 weeks.  Patient was diagnosed with CHF exacerbation and severe pulmonary hypertension.  Also found to have acute right upper lobe and subsegmental pulmonary embolism without cor pulmonale. PMH: lymphedema, HTN   Clinical Impression   The pt is currently limited by the below listed deficits which compromise her ADL performance and overall functional independence (see OT problem list). She was also noted to be with general deconditioning, BLE edema, and subjective reports of moderate shortness of breath with activity. She required min assist to stand using a RW, mod assist for lower body dressing, and CGA to take lateral steps towards the head of the bed using a RW. She will benefit from further OT services to maximize her ADL performance and to facilitate her safe return home.        If plan is discharge home, recommend the following: Assistance with cooking/housework;A little help with bathing/dressing/bathroom;A little help with walking and/or transfers;Assist for transportation    Functional Status Assessment  Patient has had a recent decline in their functional status and demonstrates the ability to make significant improvements in function in a reasonable and predictable amount of time.  Equipment Recommendations  None recommended by OT    Recommendations for Other Services       Precautions / Restrictions Precautions Precautions: Fall Precaution Comments: monitor sats Restrictions Weight Bearing Restrictions: No      Mobility Bed Mobility Overal bed mobility: Needs Assistance Bed Mobility: Supine to Sit, Sit to Supine     Supine to sit: Contact guard, HOB elevated, Used rails Sit to supine: Min assist, HOB elevated, Used  rails        Transfers Overall transfer level: Needs assistance Equipment used: Rolling walker (2 wheels) Transfers: Sit to/from Stand Sit to Stand: Min assist           General transfer comment: She performed 2 sit to stand transfers from EOB using a RW. She required steadying assist to take lateral steps towards the [facility] using RW.      Balance       Sitting balance - Comments: static sitting-good. dynamic sitting-fair+     Standing balance-Leahy Scale: Fair             ADL either performed or assessed with clinical judgement   ADL Overall ADL's : Needs assistance/impaired Eating/Feeding: Independent;Sitting Eating/Feeding Details (indicate cue type and reason): based on clinical judgement Grooming: Set up;Sitting Grooming Details (indicate cue type and reason): simulated seated EOB         Upper Body Dressing : Minimal assistance;Sitting Upper Body Dressing Details (indicate cue type and reason): She donned a hospital gown seated EOB Lower Body Dressing: Moderate assistance;Sit to/from stand Lower Body Dressing Details (indicate cue type and reason): simulated Toilet Transfer: Minimal assistance;Rolling walker (2 wheels);BSC/3in1;Stand-pivot Toilet Transfer Details (indicate cue type and reason): based on clinical judgement                 Vision   Additional Comments: She correctly read the time depicted on the wall clock.            Pertinent Vitals/Pain Pain Assessment Pain Assessment: No/denies pain     Extremity/Trunk Assessment Upper Extremity Assessment Upper Extremity Assessment: Overall WFL for tasks assessed;Right hand dominant;RUE deficits/detail;LUE deficits/detail RUE Deficits / Details:  AROM WFL. Grip strength 4/5 LUE Deficits / Details: AROM WFL. Grip strength 4/5   Lower Extremity Assessment Lower Extremity Assessment: Generalized weakness       Communication Communication Communication: No apparent difficulties    Cognition Arousal: Alert Behavior During Therapy: WFL for tasks assessed/performed Overall Cognitive Status: Within Functional Limits for tasks assessed        General Comments: Oriented x4, able to follow 1-2 step commands without difficulty                Home Living Family/patient expects to be discharged to:: Private residence Living Arrangements: Spouse/significant other   Type of Home: Product/process development scientist of Steps: 1st floor apartment   Home Layout: One level     Bathroom Shower/Tub: Walk-in shower         Home Equipment: Cane - single point          Prior Functioning/Environment Prior Level of Function : Independent/Modified Independent             Mobility Comments: Ambulatory with cane ADLs Comments: She reported being independent with ADLs, cooking, and cleaning.        OT Problem List: Decreased strength;Decreased activity tolerance;Increased edema;Impaired balance (sitting and/or standing);Cardiopulmonary status limiting activity      OT Treatment/Interventions: Self-care/ADL training;Therapeutic exercise;Energy conservation;DME and/or AE instruction;Therapeutic activities;Balance training;Patient/family education    OT Goals(Current goals can be found in the care plan section) Acute Rehab OT Goals OT Goal Formulation: With patient Time For Goal Achievement: 07/11/23 Potential to Achieve Goals: Good ADL Goals Pt Will Perform Grooming: with supervision;standing Pt Will Perform Lower Body Dressing: with supervision;sit to/from stand Pt Will Transfer to Toilet: with supervision;ambulating;grab bars Pt Will Perform Toileting - Clothing Manipulation and hygiene: with supervision;sit to/from stand  OT Frequency: Min 1X/week       AM-PAC OT "6 Clicks" Daily Activity     Outcome Measure Help from another person eating meals?: None Help from another person taking care of personal grooming?: A Little Help from another person  toileting, which includes using toliet, bedpan, or urinal?: A Little Help from another person bathing (including washing, rinsing, drying)?: A Lot Help from another person to put on and taking off regular upper body clothing?: A Little Help from another person to put on and taking off regular lower body clothing?: A Lot 6 Click Score: 17   End of Session Equipment Utilized During Treatment: Rolling walker (2 wheels) Nurse Communication: Other (comment) (need for linen changes)  Activity Tolerance: Patient tolerated treatment well Patient left: in bed;with call bell/phone within reach;with nursing/sitter in room  OT Visit Diagnosis: Muscle weakness (generalized) (M62.81);Unsteadiness on feet (R26.81)                Time: 9147-8295 OT Time Calculation (min): 24 min Charges:  OT General Charges $OT Visit: 1 Visit OT Evaluation $OT Eval Moderate Complexity: 1 Mod OT Treatments $Therapeutic Activity: 8-22 mins    Reuben Likes, OTR/L 06/27/2023, 5:29 PM

## 2023-06-27 NOTE — Progress Notes (Signed)
PROGRESS NOTE    Carrie Guzman  ZDG:[id number] DOB: Jun 30, 1946 DOA: 06/21/2023 PCP: Melida Quitter, MD    Brief Narrative:  77 year old with history of lymphedema, HTN, obesity admitted to the hospital with concerns of shortness of breath ongoing for 2-3 weeks.  Patient was diagnosed with CHF exacerbation.  She had elevated BNP, total bilirubin was elevated.  Upon admission started on diuretics.  Echocardiogram showed preserved EF with elevated PASP, cor pulmonale and pericardial effusion without tamponade.  CTA chest showed acute pulmonary embolism but right side heart strain, case discussed with pulmonary who recommended heparin otherwise no indication for thrombectomy at this time.  Initial plan was to transfer patient to [facility] for RHC but given acute pulmonary embolism, will monitor the patient.  Eventually patient's heparin drip was transitioned to Eliquis as no further inpatient procedures were planned.  Patient was diuresed with IV Lasix to which she was responding well.  PT/OT did not require any further follow-up.     Assessment & Plan:  Principal Problem:   Acute CHF (congestive heart failure) (HCC) Active Problems:   Acute systolic (congestive) heart failure (HCC)   Acute respiratory distress with mild hypoxia Acute congestive heart failure, preserved ejection fraction" pulmonology, EF 60% Pericardial effusion without tamponade -Patient certainly has right-sided heart failure and signs of peripheral volume overload.e.  Patient seen by cardiology, eventually will need RHC once euvolemic.  Repeat echo in 2 weeks.  Lasix 40 mg IV   Acute right upper lobe and subsegmental pulmonary embolism without cor pulmonale Moderate pleural effusion, left - Seen by pulmonary, no indication for thrombectomy at this time.  Transition to Eliquis, discussed with pulmonary.  2 extremity Dopplers negative for DVT.  Will need outpatient sleep study.  Thrombocytopenia - Platelets  drifted down but now they are stable around 66.  Continue anticoagulation.  Seen by hematology.  No need to hold anticoagulation unless if she develops bleeding or platelets are below 30.  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  Will need to repeat in next 3-4 weeks   Elevated total bilirubin, improving - Total bilirubin slightly trending upwards again.  Will obtain right upper quadrant ultrasound   Lymphedema -this appears to be chronic   Hypokalemia -Replete as needed   Hyperlipidemia -continue home Crestor    Hypertension -IV prn and lasix   Morbid obesity -BMI 42.9, complicating all aspects of care    PT/OT-home health, face-to-face completed   DVT prophylaxis: Eliquis Code Status: Full code Family Communication: Husband present at bedside Status is: Inpatient Remains inpatient appropriate because: Ongoing management for acute pulmonary embolism and CHF                 Diet Orders (From admission, onward)     Start     Ordered   06/24/23 0815  Diet Heart Room service appropriate? Yes; Fluid consistency: Thin  Diet effective now       Question Answer Comment  Room service appropriate? Yes   Fluid consistency: Thin      06/24/23 0815            Subjective: No new complaints  Doing better.    Examination:  General exam: Appears calm and comfortable  Respiratory system: Clear to auscultation. Respiratory effort normal. Cardiovascular system: S1 & S2 heard, RRR. No JVD, murmurs, rubs, gallops or clicks. No pedal edema. Gastrointestinal system: Abdomen is nondistended, soft and  nontender. No organomegaly or masses felt. Normal bowel sounds heard. Central nervous system: Alert and oriented. No focal neurological deficits. Extremities: Symmetric 5 x 5 power. Skin: No rashes, lesions or ulcers Psychiatry: Judgement and insight appear normal. Mood & affect  appropriate.  Objective: Vitals:   06/26/23 1510 06/26/23 2021 06/27/23 0452 06/27/23 1227  BP:  126/61 (!) 132/57 (!) 153/68  Pulse:  77 75 73  Resp:  18 18 18   Temp:  (!) 97.3 F (36.3 C) 97.8 F (36.6 C) (!) 97.5 F (36.4 C)  TempSrc:  Oral Oral Oral  SpO2: (!) 86% 94% 92% 97%  Weight:   117.1 kg   Height:        Intake/Output Summary (Last 24 hours) at 06/27/2023 1328 Last data filed at 06/27/2023 1237 Gross per 24 hour  Intake 240 ml  Output 1650 ml  Net -1410 ml   Filed Weights   06/25/23 0500 06/26/23 0704 06/27/23 0452  Weight: 119.1 kg 116.9 kg 117.1 kg    Scheduled Meds:  apixaban  10 mg Oral BID   Followed by   Melene Muller ON 07/03/2023] apixaban  5 mg Oral BID   Chlorhexidine Gluconate Cloth  6 each Topical Daily   furosemide  40 mg Intravenous Once   furosemide  40 mg Intravenous BID   potassium chloride  40 mEq Oral Q4H   rosuvastatin  10 mg Oral Daily   Continuous Infusions:  Nutritional status     Body mass index is 44.31 kg/m.  Data Reviewed:   CBC: Recent Labs  Lab 06/21/23 1153 06/21/23 1332 06/23/23 0549 06/24/23 0038 06/25/23 0507 06/26/23 0445  WBC 7.6  --  8.8 6.5 7.5 7.7  HGB 15.9* 15.3* 14.3 14.2 14.9 14.4  HCT 49.2* 45.0 44.9 46.1* 48.8* 46.6*  MCV 100.8*  --  102.0* 105.7* 105.6* 105.4*  PLT 111*  --  65* 66* 67* 68*   Basic Metabolic Panel: Recent Labs  Lab 06/23/23 0549 06/24/23 0038 06/25/23 0507 06/26/23 0445 06/27/23 0502  NA 141 143 144 143 141  K 4.1 3.4* 3.7 3.8 3.3*  CL 106 109 106 105 102  CO2 26 27 30  32 33*  GLUCOSE 99 112* 92 97 94  BUN 23 23 20 17 17   CREATININE 0.45 0.61 0.58 0.53 0.56  CALCIUM 9.9 9.6 9.9 9.8 9.7  MG 1.9 2.1 1.9 1.9 2.0  PHOS 2.1*  --   --   --   --    GFR: Estimated Creatinine Clearance: 76.4 mL/min (by C-G formula based on SCr of 0.56 mg/dL). Liver Function Tests: Recent Labs  Lab 06/23/23 0549 06/24/23 0038 06/25/23 0507 06/26/23 0445 06/27/23 0502  AST 28 17 17 17 17    ALT 17 14 16 15 15   ALKPHOS 76 79 84 84 87  BILITOT 3.3* 3.1* 3.8* 3.8* 4.1*  PROT 5.7* 5.7* 6.2* 5.9* 5.9*  ALBUMIN 2.8* 2.7* 2.9* 2.7* 2.7*   No results for input(s): "LIPASE", "AMYLASE" in the last 168 hours. Recent Labs  Lab 06/21/23 1331  AMMONIA 24   Coagulation Profile: Recent Labs  Lab 06/21/23 1153  INR 1.3*   Cardiac Enzymes: No results for input(s): "CKTOTAL", "CKMB", "CKMBINDEX", "TROPONINI" in the last 168 hours. BNP (last 3 results) No results for input(s): "PROBNP" in the last 8760 hours. HbA1C: No results for input(s): "HGBA1C" in the last 72 hours. CBG: Recent Labs  Lab 06/23/23 0811 06/23/23 1140 06/23/23 1631 06/23/23 2056 06/24/23 2022  GLUCAP 98 102* 98 135* 111*  Lipid Profile: No results for input(s): "CHOL", "HDL", "LDLCALC", "TRIG", "CHOLHDL", "LDLDIRECT" in the last 72 hours. Thyroid Function Tests: No results for input(s): "TSH", "T4TOTAL", "FREET4", "T3FREE", "THYROIDAB" in the last 72 hours. Anemia Panel: Recent Labs    06/25/23 0507  VITAMINB12 561  FOLATE 13.5   Sepsis Labs: No results for input(s): "PROCALCITON", "LATICACIDVEN" in the last 168 hours.  Recent Results (from the past 240 hour(s))  SARS Coronavirus 2 by RT PCR (hospital order, performed in [facility] hospital lab) *cepheid single result test* Anterior Nasal Swab     Status: None   Collection Time: 06/21/23 12:07 PM   Specimen: Anterior Nasal Swab  Result Value Ref Range Status   SARS Coronavirus 2 by RT PCR NEGATIVE NEGATIVE Final    Comment: (NOTE) SARS-CoV-2 target nucleic acids are NOT DETECTED.  The SARS-CoV-2 RNA is generally detectable in upper and lower respiratory specimens during the acute phase of infection. The lowest concentration of SARS-CoV-2 viral copies this assay can detect is 250 copies / mL. A negative result does not preclude SARS-CoV-2 infection and should not be used as the sole basis for treatment or other patient management  decisions.  A negative result may occur with improper specimen collection / handling, submission of specimen other than nasopharyngeal swab, presence of viral mutation(s) within the areas targeted by this assay, and inadequate number of viral copies (<250 copies / mL). A negative result must be combined with clinical observations, patient history, and epidemiological information.  Fact Sheet for Patients:   RoadLapTop.co.za  Fact Sheet for Healthcare Providers: http://kim-miller.com/  This test is not yet approved or  cleared by the [place] FDA and has been authorized for detection and/or diagnosis of SARS-CoV-2 by FDA under an Emergency Use Authorization (EUA).  This EUA will remain in effect (meaning this test can be used) for the duration of the COVID-19 declaration under Section 564(b)(1) of the Act, 21 U.S.C. section 360bbb-3(b)(1), unless the authorization is terminated or revoked sooner.  Performed at [facility], [address] [place], [place] [address]          Radiology Studies: No results found.         LOS: 6 days   Time spent= 35 mins    Miguel Rota, MD [facility]  If 7PM-7AM, please contact night-coverage  06/27/2023, 1:28 PM

## 2023-06-27 NOTE — Care Management Important Message (Signed)
Important Message  Patient Details IM Letter given. Name: Carrie Guzman MRN: [id number] Date of Birth: Jan 05, 1946   Medicare Important Message Given:  Yes     [name] 06/27/2023, 10:58 AM

## 2023-06-27 NOTE — Plan of Care (Signed)
  Problem: Education: Goal: Knowledge of General Education information will improve Description: Including pain rating scale, medication(s)/side effects and non-pharmacologic comfort measures Outcome: Progressing   Problem: Clinical Measurements: Goal: Respiratory complications will improve Outcome: Progressing   Problem: Nutrition: Goal: Adequate nutrition will be maintained Outcome: Progressing   Problem: Coping: Goal: Level of anxiety will decrease Outcome: Progressing   Problem: Pain Managment: Goal: General experience of comfort will improve Outcome: Progressing   Problem: Safety: Goal: Ability to remain free from injury will improve Outcome: Progressing   

## 2023-06-27 NOTE — Progress Notes (Addendum)
Patient Name: Carrie Guzman Date of Encounter: 06/27/2023 [facility] Cardiologist: Armanda Magic, MD   Interval Summary  .    Patient with ongoing dyspnea this morning, exacerbated by even minimal movement. Denies chest pain or palpitations. Continues with a dry cough. Also reports some dizziness with positional changes.   Vital Signs .    Vitals:   06/26/23 1221 06/26/23 1510 06/26/23 2021 06/27/23 0452  BP: 137/72  126/61 (!) 132/57  Pulse: 74  77 75  Resp:   18 18  Temp: (!) 97.4 F (36.3 C)  (!) 97.3 F (36.3 C) 97.8 F (36.6 C)  TempSrc: Oral  Oral Oral  SpO2: 95% (!) 86% 94% 92%  Weight:    117.1 kg  Height:        Intake/Output Summary (Last 24 hours) at 06/27/2023 0919 Last data filed at 06/26/2023 1459 Gross per 24 hour  Intake --  Output 2250 ml  Net -2250 ml      06/27/2023    4:52 AM 06/26/2023    7:04 AM 06/25/2023    5:00 AM  Last 3 Weights  Weight (lbs) 258 lb 2.5 oz 257 lb 11.2 oz 262 lb 9.1 oz  Weight (kg) 117.1 kg 116.892 kg 119.1 kg      Telemetry/ECG    Sinus rhythm with isolated PVCs - Personally Reviewed  Physical Exam .   GEN: No acute distress.   Neck: JVD difficult to assess secondary to body habitus Cardiac: RRR. RUSB murmur. Persistent split S2 with RBBB Respiratory: bibasilar crackles GI: Soft, nontender, non-distended  MS: significant mixed edema in bilateral lower extremities. Pitting noted to knees bilaterally.   Assessment & Plan .     Carrie Guzman is a 77 y.o. female with a hx of lymphedema, htn, obesity (BMI 46), HLD who is being seen for the evaluation of pulmonary HTN at the request of Dr. Nelson Chimes.   Right sided heart failure  Severe Pulmonary HTN   Patient with TTE this admission showing LVEF 60-65%, no RWMA, grade I DD, but severe pulmonary hypertension with severe RV dilation and dysfunction consistent with cor pulmonale. BNP 1771 with CXR showing cardiomegaly, pulmonary vascular congestion, and  interstitial edema.   Patient net negative 10L this admission but continues to appear volume up with LE edema and bibasilar crackles on pulmonary auscultation. Continue Lasix 40mg  IV. Will give a second dose this evening with stable renal function. Will add additional potassium supplementation. Pulmonary HTN likely of mixed etiology: diastolic CHF with OSA/OHS, and acute pulmonary embolism. Will need RHC in outpatient to further characterize pulmonary hypertension after acute PE treatment.  Could consider initiation of SGLT2 per 2022 AHA/ACC/HFSA Guideline although would need to weigh this against risk of UTI with patient body habitus and stasis.    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 Will need outpatient sleep study.   Acute pulmonary embolism  Patient found with acute right upper lobe segmental and subsegmental PE with evidence of right heart strain.   Initially managed with heparin, now on Eliquis.   Moderate pericardial effusion   TTE noting moderate pericardial effusion without evidence of tamponade. ANA, anti-DNA, and anti-scleroderma studies negative.  Plan repeat echocardiogram in 2-3 weeks for monitoring.   For questions or updates, please contact [facility] Please consult [email or web address] for contact info under        Signed, Perlie Gold, PA-C   History and all data  above reviewed.  Patient examined.  I agree with the findings as above.  The patient exam reveals   She might be breathing slightly better but still SOB walking from bed to bathroom in the room.  COR:RRR  ,  Lungs: Mildly decreased breath sounds with basilar crackles  ,  Abd: Positive bowel sounds, no rebound no guarding, Ext Mild/moderate edema  .  All available labs, radiology testing, previous records reviewed. Agree with documented assessment and plan.  Unfortunate patient with multifactorial pulmonary HTN.  Continues to tolerate IV diuresis.    We need to continue to decongest as much as her kidneys will tolerate.  I will increase to BID.  Continue anticoagulant and ultimately will need sleep study and at least right heart cath but this can be done as an out patient in the months to come.  I will hold of on SGLT2 as I think the benefit at this point would be marginal but I might consider starting this before discharge.    Carrie Guzman  1:09 PM  06/27/2023

## 2023-06-28 ENCOUNTER — Inpatient Hospital Stay (HOSPITAL_COMMUNITY): Payer: Medicare Other

## 2023-06-28 DIAGNOSIS — I509 Heart failure, unspecified: Secondary | ICD-10-CM | POA: Diagnosis not present

## 2023-06-28 DIAGNOSIS — I5031 Acute diastolic (congestive) heart failure: Secondary | ICD-10-CM | POA: Diagnosis not present

## 2023-06-28 DIAGNOSIS — J9601 Acute respiratory failure with hypoxia: Secondary | ICD-10-CM | POA: Diagnosis not present

## 2023-06-28 LAB — CBC
HCT: 46 % (ref 36.0–46.0)
Hemoglobin: 14.4 g/dL (ref 12.0–15.0)
MCH: 32.3 pg (ref 26.0–34.0)
MCHC: 31.3 g/dL (ref 30.0–36.0)
MCV: 103.1 fL — ABNORMAL HIGH (ref 80.0–100.0)
Platelets: 66 10*3/uL — ABNORMAL LOW (ref 150–400)
RBC: 4.46 MIL/uL (ref 3.87–5.11)
RDW: 17.4 % — ABNORMAL HIGH (ref 11.5–15.5)
WBC: 8.2 10*3/uL (ref 4.0–10.5)
nRBC: 0 % (ref 0.0–0.2)

## 2023-06-28 LAB — MAGNESIUM: Magnesium: 1.7 mg/dL (ref 1.7–2.4)

## 2023-06-28 LAB — COMPREHENSIVE METABOLIC PANEL
ALT: 15 U/L (ref 0–44)
AST: 21 U/L (ref 15–41)
Albumin: 2.7 g/dL — ABNORMAL LOW (ref 3.5–5.0)
Alkaline Phosphatase: 81 U/L (ref 38–126)
Anion gap: 9 (ref 5–15)
BUN: 15 mg/dL (ref 8–23)
CO2: 33 mmol/L — ABNORMAL HIGH (ref 22–32)
Calcium: 9.9 mg/dL (ref 8.9–10.3)
Chloride: 99 mmol/L (ref 98–111)
Creatinine, Ser: 0.53 mg/dL (ref 0.44–1.00)
GFR, Estimated: >60 mL/min (ref >60)
Glucose, Bld: 93 mg/dL (ref 70–99)
Potassium: 3.5 mmol/L (ref 3.5–5.1)
Sodium: 141 mmol/L (ref 135–145)
Total Bilirubin: 4.5 mg/dL — ABNORMAL HIGH (ref 0.3–1.2)
Total Protein: 6 g/dL — ABNORMAL LOW (ref 6.5–8.1)

## 2023-06-28 MED ORDER — MAGNESIUM OXIDE -MG SUPPLEMENT 400 (240 MG) MG PO TABS
800.0000 mg | ORAL_TABLET | Freq: Once | ORAL | Status: AC
  Administered 2023-06-28: 800 mg via ORAL
  Filled 2023-06-28: qty 2

## 2023-06-28 MED ORDER — POTASSIUM CHLORIDE CRYS ER 20 MEQ PO TBCR
40.0000 meq | EXTENDED_RELEASE_TABLET | Freq: Two times a day (BID) | ORAL | Status: AC
  Administered 2023-06-28 (×2): 40 meq via ORAL
  Filled 2023-06-28 (×2): qty 2

## 2023-06-28 MED ORDER — HEPARIN (PORCINE) 25000 UT/250ML-% IV SOLN
1100.0000 [IU]/h | INTRAVENOUS | Status: DC
  Administered 2023-06-28: 1400 [IU]/h via INTRAVENOUS
  Administered 2023-06-29: 1150 [IU]/h via INTRAVENOUS
  Filled 2023-06-28 (×2): qty 250

## 2023-06-28 NOTE — Plan of Care (Signed)
Monitor labs and vital signs. Monitor intake and output. Monitor weights. Continue current plan of care.

## 2023-06-28 NOTE — Progress Notes (Signed)
ANTICOAGULATION CONSULT NOTE - Initial Consult  Pharmacy Consult for heparin Indication: pulmonary embolus  No Known Allergies  Patient Measurements: Height: 5\' 4"  (162.6 cm) Weight: 117 kg (257 lb 15 oz) IBW/kg (Calculated) : 54.7 Heparin Dosing Weight: 83 kg  Vital Signs: Temp: 97.7 F (36.5 C) (08/21 0403) Temp Source: Oral (08/21 0403) BP: 134/69 (08/21 0403) Pulse Rate: 85 (08/21 0403)  Labs: Recent Labs    06/26/23 0445 06/27/23 0502 06/28/23 0518  HGB 14.4  --  14.4  HCT 46.6*  --  46.0  PLT 68*  --  66*  HEPARINUNFRC 0.29*  --   --   CREATININE 0.53 0.56 0.53    Estimated Creatinine Clearance: 76.4 mL/min (by C-G formula based on SCr of 0.53 mg/dL).   Medical History: Past Medical History:  Diagnosis Date   Elevated cholesterol    Hypertension    Squamous cell carcinoma of skin 05/12/2021   Left Lower Leg - anterior (in situ) (curet and 5FU)    Assessment: 8/15 heparin >> 8/19 Eliquis >> 8/21 heparin Currently on Eliquis since 8/19 for treatment of PE (last dose 0940 8/21). Per MD will transition back to heparin in anticipation of possible paracentesis in 48 hours. Hgb WNL at 14.4 today. Platelets stable at 66. Per Heme/Onc consult hold anticoagulation if platelets <30k or any issues with bleeding.  Goal of Therapy:  aPTT 66-102 seconds Monitor platelets by anticoagulation protocol: Yes   Plan:  IV heparin at 1,400 units/hr (no bolus). Infusion will begin at 2200 on 8/21 when next Eliquis dose would be due. Monitor DHL and daily aPTT. Initial titration of heparin based on aPTT levels until Hls correlate with aPTT.   Carrie Guzman 06/28/2023,1:49 PM

## 2023-06-28 NOTE — TOC Progression Note (Signed)
Transition of Care [facility]) - Progression Note    Patient Details  Name: Carrie Guzman MRN: [id number] Date of Birth: [birth year]  Transition of [facility]) CM/SW Contact  Kari Kerth, Olegario Messier, RN Phone Number: 06/28/2023, 10:32 AM  Clinical Narrative:  Spoke to spouse Everlene Balls has gotten a rw from neighbor. [employer] rep Jermaine aware-has not brought rw to rm;awaiting if home 02 will be needed-await 02 sats within 48hrs, & home 02 order if needed. [employer] rep Elnita Maxwell aware of HHPT/OT @ d/c. Has own transport home.     Expected Discharge Plan: Home w Home Health Services Barriers to Discharge: Continued Medical Work up  Expected Discharge Plan and Services   Discharge Planning Services: CM Consult Post Acute Care Choice: Home Health Living arrangements for the past 2 months: Single Family Home                 DME Arranged: Walker rolling DME Agency: [employer] Date DME Agency Contacted: 06/23/23 Time DME Agency Contacted: 1504 Representative spoke with at DME Agency: Vaughan Basta HH Arranged: OT HH Agency: [facility] Date [facility] Agency Contacted: 06/23/23 Time HH Agency Contacted: 1504 Representative spoke with at [facility] Agency: Elnita Maxwell   Social Determinants of Health (SDOH) Interventions SDOH Screenings   Food Insecurity: No Food Insecurity (06/21/2023)  Housing: Low Risk  (06/21/2023)  Transportation Needs: No Transportation Needs (06/21/2023)  Utilities: Not At Risk (06/21/2023)  Tobacco Use: Low Risk  (06/21/2023)  Recent Concern: Tobacco Use - Medium Risk (06/07/2023)   Received from [facility]    Readmission Risk Interventions     No data to display

## 2023-06-28 NOTE — Progress Notes (Signed)
Occupational Therapy Treatment Patient Details Name: Carrie Guzman MRN: [id number] DOB: August 22, 1946 Today's Date: 06/28/2023   History of present illness 77 year old with history of lymphedema, HTN, obesity admitted to the hospital with concerns of shortness of breath ongoing for 2-3 weeks.  Patient was diagnosed with CHF exacerbation and severe pulmonary hypertension.  Also found to have acute right upper lobe and subsegmental pulmonary embolism without cor pulmonale.   OT comments  The pt was seen for functional strengthening, progression of functional activity, and energy conservation education. The pt performed sit to stand using a RW and short distance ambulation in her room using a RW. She reported feelings of shortness of breath with activity, with instruction further provided on pursed lip breathing and implementing therapeutic rest breaks as needed. She was assisted into upright sitting at chair level. Continue OT plan of care.       If plan is discharge home, recommend the following:  Assistance with cooking/housework;A little help with bathing/dressing/bathroom;A little help with walking and/or transfers;Assist for transportation   Equipment Recommendations  None recommended by OT    Recommendations for Other Services      Precautions / Restrictions Precautions Precaution Comments: monitor sats Restrictions Weight Bearing Restrictions: No       Mobility Bed Mobility      General bed mobility comments: Pt was received seated in the bedside chair    Transfers Overall transfer level: Needs assistance Equipment used: Rolling walker (2 wheels) Transfers: Sit to/from Stand Sit to Stand: Min assist                     ADL either performed or assessed with clinical judgement   ADL Overall ADL's : Needs assistance/impaired Eating/Feeding: Independent;Sitting Eating/Feeding Details (indicate cue type and reason): based on clinical judgement Grooming: Set  up;Sitting Grooming Details (indicate cue type and reason): at chair level         Upper Body Dressing : Set up;Sitting Upper Body Dressing Details (indicate cue type and reason): based on clinical judgement Lower Body Dressing: Independent Lower Body Dressing Details (indicate cue type and reason): simulated Toilet Transfer: Minimal assistance;Rolling walker (2 wheels);BSC/3in1;Ambulation;Regular Teacher, adult education Details (indicate cue type and reason): based on clinical judgement                       Praxis      Cognition Arousal: Alert Behavior During Therapy: WFL for tasks assessed/performed Overall Cognitive Status: Within Functional Limits for tasks assessed                          Pertinent Vitals/ Pain       Pain Assessment Pain Assessment: No/denies pain         Frequency  Min 1X/week        Progress Toward Goals  OT Goals(current goals can now be found in the care plan section)  Progress towards OT goals: Progressing toward goals  Acute Rehab OT Goals OT Goal Formulation: With patient Time For Goal Achievement: 07/11/23 Potential to Achieve Goals: Good  Plan         AM-PAC OT "6 Clicks" Daily Activity     Outcome Measure   Help from another person eating meals?: None Help from another person taking care of personal grooming?: A Little Help from another person toileting, which includes using toliet, bedpan, or urinal?: A Little Help from another person bathing (including washing, rinsing, drying)?:  A Lot Help from another person to put on and taking off regular upper body clothing?: A Little Help from another person to put on and taking off regular lower body clothing?: A Lot 6 Click Score: 17    End of Session Equipment Utilized During Treatment: Oxygen  OT Visit Diagnosis: Muscle weakness (generalized) (M62.81);Unsteadiness on feet (R26.81)   Activity Tolerance Other (comment) (Fair+ tolerance. Shortness of breath  noted with activity)   Patient Left in chair;with call bell/phone within reach;with family/visitor present   Nurse Communication Other (comment)        Time: 1450-1500 OT Time Calculation (min): 10 min  Charges: OT General Charges $OT Visit: 1 Visit OT Treatments $Therapeutic Activity: 8-22 mins     Reuben Likes, OTR/L 06/28/2023, 4:13 PM

## 2023-06-28 NOTE — Progress Notes (Signed)
PCCM Progress Note  Bedside US of right thorax with small fluid and atelectasis. Single pocket with fluid pocket amenable to drainage however on DOAC. Spoke to primary team who felt she is slowly responding to diuresis. Discussed with team and will transition back to heparin gtt for potential thoracentesis in 48 hours if pleural effusion remains present.

## 2023-06-28 NOTE — Progress Notes (Signed)
PROGRESS NOTE    Carrie Guzman  ZHY:[id number] DOB: December 17, 1945 DOA: 06/21/2023 PCP: Melida Quitter, MD    Brief Narrative:  78 year old with history of lymphedema, HTN, obesity admitted to the hospital with concerns of shortness of breath ongoing for 2-3 weeks.  Patient was diagnosed with CHF exacerbation.  She had elevated BNP, total bilirubin was elevated.  Upon admission started on diuretics.  Echocardiogram showed preserved EF with elevated PASP, cor pulmonale and pericardial effusion without tamponade.  CTA chest showed acute pulmonary embolism but right side heart strain, case discussed with pulmonary who recommended heparin otherwise no indication for thrombectomy at this time.  Initial plan was to transfer patient to [facility] for RHC but given acute pulmonary embolism, will monitor the patient.  Eventually patient's heparin drip was transitioned to Eliquis as no further inpatient procedures were planned.  Patient was diuresed with IV Lasix to which she was responding well.  PT/OT did not require any further follow-up.     Assessment & Plan:  Principal Problem:   Acute CHF (congestive heart failure) (HCC) Active Problems:   Acute systolic (congestive) heart failure (HCC)   Acute respiratory distress with mild hypoxia Acute congestive heart failure, preserved ejection fraction" pulmonology, EF 60% Pericardial effusion without tamponade -Patient certainly has right-sided heart failure and signs of peripheral volume overload.e.  Patient seen by cardiology, eventually will need RHC once euvolemic.  Repeat echo in 2 weeks.  Tolerating Lasix well, continue   Acute right upper lobe and subsegmental pulmonary embolism without cor pulmonale Moderate pleural effusion, left - Seen by pulmonary, no indication for thrombectomy at this time.  Lower extremity Dopplers negative for DVT.  Will transition back to heparin drip in anticipation for possible thoracentesis in 48  hours.  Thrombocytopenia - Drifted down but around 66K and stable.  Seen by hematology.  No need to hold anticoagulation unless if she develops bleeding or platelets are below 30.  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  Will need to repeat in next 3-4 weeks   Elevated total bilirubin, improving - Total bilirubin slightly trending upwards again.  Will obtain right upper quadrant ultrasound   Lymphedema -this appears to be chronic   Hypokalemia -Replete as needed   Hyperlipidemia -continue home Crestor    Hypertension -IV prn and lasix   Morbid obesity -BMI 42.9, complicating all aspects of care    PT/OT-home health, face-to-face completed   DVT prophylaxis: Full dose anticoagulation Code Status: Full code Family Communication: Husband present at bedside Status is: Inpatient Remains inpatient appropriate because: Ongoing management for acute pulmonary embolism and CHF             Pressure Injury 06/26/23 Sacrum Lower Stage 1 -  Intact skin with non-blanchable redness of a localized area usually over a bony prominence.  (Active)  06/26/23 0946  Location: Sacrum  Location Orientation: Lower (lower sacrum)  Staging: Stage 1 -  Intact skin with non-blanchable redness of a localized area usually over a bony prominence.  Wound Description (Comments): -- (non-blanchable redness with small abrasion)  Present on Admission: No     Diet Orders (From admission, onward)     Start     Ordered   06/24/23 0815  Diet Heart Room service appropriate? Yes; Fluid consistency: Thin  Diet effective now       Question Answer Comment  Room service appropriate? Yes   Fluid consistency:  Thin      06/24/23 0815            Subjective: Seen at bedside, no new complaints.  Tells me she has been diuresing well.   Examination:  General exam: Appears calm and comfortable  Respiratory system: Clear to  auscultation. Respiratory effort normal. Cardiovascular system: S1 & S2 heard, RRR. No JVD, murmurs, rubs, gallops or clicks. No pedal edema. Gastrointestinal system: Abdomen is nondistended, soft and nontender. No organomegaly or masses felt. Normal bowel sounds heard. Central nervous system: Alert and oriented. No focal neurological deficits. Extremities: Symmetric 5 x 5 power. Skin: No rashes, lesions or ulcers Psychiatry: Judgement and insight appear normal. Mood & affect appropriate.  Objective: Vitals:   06/27/23 0452 06/27/23 1227 06/27/23 1940 06/28/23 0403  BP: (!) 132/57 (!) 153/68 132/77 134/69  Pulse: 75 73 91 85  Resp: 18 18 18 20   Temp: 97.8 F (36.6 C) (!) 97.5 F (36.4 C) 97.7 F (36.5 C) 97.7 F (36.5 C)  TempSrc: Oral Oral Oral Oral  SpO2: 92% 97% 91% 91%  Weight: 117.1 kg   117 kg  Height:        Intake/Output Summary (Last 24 hours) at 06/28/2023 1152 Last data filed at 06/28/2023 0353 Gross per 24 hour  Intake 680 ml  Output 2400 ml  Net -1720 ml   Filed Weights   06/26/23 0704 06/27/23 0452 06/28/23 0403  Weight: 116.9 kg 117.1 kg 117 kg    Scheduled Meds:  Chlorhexidine Gluconate Cloth  6 each Topical Daily   furosemide  40 mg Intravenous BID   potassium chloride  40 mEq Oral BID   rosuvastatin  10 mg Oral Daily   Continuous Infusions:  Nutritional status     Body mass index is 44.27 kg/m.  Data Reviewed:   CBC: Recent Labs  Lab 06/23/23 0549 06/24/23 0038 06/25/23 0507 06/26/23 0445 06/28/23 0518  WBC 8.8 6.5 7.5 7.7 8.2  HGB 14.3 14.2 14.9 14.4 14.4  HCT 44.9 46.1* 48.8* 46.6* 46.0  MCV 102.0* 105.7* 105.6* 105.4* 103.1*  PLT 65* 66* 67* 68* 66*   Basic Metabolic Panel: Recent Labs  Lab 06/23/23 0549 06/24/23 0038 06/25/23 0507 06/26/23 0445 06/27/23 0502 06/28/23 0518  NA 141 143 144 143 141 141  K 4.1 3.4* 3.7 3.8 3.3* 3.5  CL 106 109 106 105 102 99  CO2 26 27 30  32 33* 33*  GLUCOSE 99 112* 92 97 94 93  BUN 23  23 20 17 17 15   CREATININE 0.45 0.61 0.58 0.53 0.56 0.53  CALCIUM 9.9 9.6 9.9 9.8 9.7 9.9  MG 1.9 2.1 1.9 1.9 2.0 1.7  PHOS 2.1*  --   --   --   --   --    GFR: Estimated Creatinine Clearance: 76.4 mL/min (by C-G formula based on SCr of 0.53 mg/dL). Liver Function Tests: Recent Labs  Lab 06/24/23 0038 06/25/23 0507 06/26/23 0445 06/27/23 0502 06/28/23 0518  AST 17 17 17 17 21   ALT 14 16 15 15 15   ALKPHOS 79 84 84 87 81  BILITOT 3.1* 3.8* 3.8* 4.1* 4.5*  PROT 5.7* 6.2* 5.9* 5.9* 6.0*  ALBUMIN 2.7* 2.9* 2.7* 2.7* 2.7*   No results for input(s): "LIPASE", "AMYLASE" in the last 168 hours. Recent Labs  Lab 06/21/23 1331  AMMONIA 24   Coagulation Profile: Recent Labs  Lab 06/21/23 1153  INR 1.3*   Cardiac Enzymes: No results for input(s): "CKTOTAL", "CKMB", "CKMBINDEX", "TROPONINI" in the last  168 hours. BNP (last 3 results) No results for input(s): "PROBNP" in the last 8760 hours. HbA1C: No results for input(s): "HGBA1C" in the last 72 hours. CBG: Recent Labs  Lab 06/23/23 0811 06/23/23 1140 06/23/23 1631 06/23/23 2056 06/24/23 2022  GLUCAP 98 102* 98 135* 111*   Lipid Profile: No results for input(s): "CHOL", "HDL", "LDLCALC", "TRIG", "CHOLHDL", "LDLDIRECT" in the last 72 hours. Thyroid Function Tests: No results for input(s): "TSH", "T4TOTAL", "FREET4", "T3FREE", "THYROIDAB" in the last 72 hours. Anemia Panel: No results for input(s): "VITAMINB12", "FOLATE", "FERRITIN", "TIBC", "IRON", "RETICCTPCT" in the last 72 hours. Sepsis Labs: No results for input(s): "PROCALCITON", "LATICACIDVEN" in the last 168 hours.  Recent Results (from the past 240 hour(s))  SARS Coronavirus 2 by RT PCR (hospital order, performed in [facility] hospital lab) *cepheid single result test* Anterior Nasal Swab     Status: None   Collection Time: 06/21/23 12:07 PM   Specimen: Anterior Nasal Swab  Result Value Ref Range Status   SARS Coronavirus 2 by RT PCR NEGATIVE NEGATIVE Final     Comment: (NOTE) SARS-CoV-2 target nucleic acids are NOT DETECTED.  The SARS-CoV-2 RNA is generally detectable in upper and lower respiratory specimens during the acute phase of infection. The lowest concentration of SARS-CoV-2 viral copies this assay can detect is 250 copies / mL. A negative result does not preclude SARS-CoV-2 infection and should not be used as the sole basis for treatment or other patient management decisions.  A negative result may occur with improper specimen collection / handling, submission of specimen other than nasopharyngeal swab, presence of viral mutation(s) within the areas targeted by this assay, and inadequate number of viral copies (<250 copies / mL). A negative result must be combined with clinical observations, patient history, and epidemiological information.  Fact Sheet for Patients:   RoadLapTop.co.za  Fact Sheet for Healthcare Providers: http://kim-miller.com/  This test is not yet approved or  cleared by the [place] FDA and has been authorized for detection and/or diagnosis of SARS-CoV-2 by FDA under an Emergency Use Authorization (EUA).  This EUA will remain in effect (meaning this test can be used) for the duration of the COVID-19 declaration under Section 564(b)(1) of the Act, 21 U.S.C. section 360bbb-3(b)(1), unless the authorization is terminated or revoked sooner.  Performed at [facility], [address] [place], [place] [address]          Radiology Studies: US Abdomen Limited RUQ (LIVER/GB)  Result Date: 06/27/2023 CLINICAL DATA:  Elevated total bilirubin EXAM: ULTRASOUND ABDOMEN LIMITED RIGHT UPPER QUADRANT COMPARISON:  None Available. FINDINGS: Gallbladder: Surgically absent. Common bile duct: Diameter: 1.6 mm Liver: No focal lesion identified. Increased in parenchymal echogenicity. Portal vein is patent on color Doppler imaging with normal direction of  blood flow towards the liver. Other: Trace free fluid in the right upper quadrant. IMPRESSION: 1. Hepatic steatosis. 2. Trace free fluid in the right upper quadrant. Electronically Signed   By: Darliss Cheney M.D.   On: 06/27/2023 20:00           LOS: 7 days   Time spent= 35 mins    Miguel Rota, MD [facility]  If 7PM-7AM, please contact night-coverage  06/28/2023, 11:52 AM

## 2023-06-28 NOTE — Progress Notes (Addendum)
Patient Name: Carrie Guzman Date of Encounter: 06/28/2023 [facility] Cardiologist: Armanda Magic, MD   Interval Summary  .    Patient reports that she feels about the same today as yesterday. Relatively stable breathing while at rest but easily becomes short of breath with minimal exertion/movement. She continues with a dry cough today. Otherwise denies chest pain, palpitations.   Vital Signs .    Vitals:   06/27/23 0452 06/27/23 1227 06/27/23 1940 06/28/23 0403  BP: (!) 132/57 (!) 153/68 132/77 134/69  Pulse: 75 73 91 85  Resp: 18 18 18 20   Temp: 97.8 F (36.6 C) (!) 97.5 F (36.4 C) 97.7 F (36.5 C) 97.7 F (36.5 C)  TempSrc: Oral Oral Oral Oral  SpO2: 92% 97% 91% 91%  Weight: 117.1 kg   117 kg  Height:        Intake/Output Summary (Last 24 hours) at 06/28/2023 0808 Last data filed at 06/28/2023 0353 Gross per 24 hour  Intake 920 ml  Output 2400 ml  Net -1480 ml      06/28/2023    4:03 AM 06/27/2023    4:52 AM 06/26/2023    7:04 AM  Last 3 Weights  Weight (lbs) 257 lb 15 oz 258 lb 2.5 oz 257 lb 11.2 oz  Weight (kg) 117 kg 117.1 kg 116.892 kg      Telemetry/ECG    Sinus rhythm with first degree AVB and RBBB morphology. Occasional episodes of sinus tachycardia - Personally Reviewed  Physical Exam .   GEN: No acute distress but ill appearing.   Neck: Difficult to appreciate JVP due to body habitus Cardiac: RRR, no murmurs, rubs, or gallops.  Respiratory: improved aeration today. Very faint crackles in bibasilar lobes. GI: Soft, nontender, non-distended  MS: notable mixed edema in bilateral lower extremities to knees. Less pitting today compared with yesterday. Lower legs are both erythematous with chronic venous stasis changes.  Assessment & Plan .     Carrie Guzman is a 77 y.o. female with a hx of lymphedema, htn, obesity (BMI 46), HLD who is being seen for the evaluation of pulmonary HTN at the request of Dr. Nelson Chimes.    Right sided heart  failure  Severe Pulmonary HTN    Patient with TTE this admission showing LVEF 60-65%, no RWMA, grade I DD, but severe pulmonary hypertension with severe RV dilation and dysfunction consistent with cor pulmonale. BNP 1771 with CXR showing cardiomegaly, pulmonary vascular congestion, and interstitial edema.    Patient net negative 11.5L this admission. She still appears volume up with LE edema and bibasilar crackles on pulmonary auscultation but both are improved today Continue Lasix 40mg  IV BID with stable renal function. Will need to monitor closely. Replace K to maintain serum level >4.0.  Pulmonary HTN likely of mixed etiology: diastolic CHF with OSA/OHS, and acute pulmonary embolism. Will need RHC in outpatient to further characterize pulmonary hypertension after acute PE treatment.  Could consider initiation of SGLT2 per 2022 AHA/ACC/HFSA Guideline although would need to weigh this against risk of UTI with patient body habitus and stasis.    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 Will need outpatient sleep study.    Acute pulmonary embolism   Patient found with acute right upper lobe segmental and subsegmental PE with evidence of right heart strain.    Initially managed with heparin, now on Eliquis.    Moderate pericardial effusion  TTE noting moderate pericardial effusion without evidence of tamponade. ANA, anti-DNA, and anti-scleroderma studies negative.   Plan repeat echocardiogram in 2-3 weeks for monitoring.    For questions or updates, please contact [facility] Please consult [email or web address] for contact info under        Signed, Perlie Gold, PA-C   History and all data above reviewed.  Patient examined.  I agree with the findings as above.   The patient continues to have dyspnea but perhaps slightly improved.   The patient exam reveals COR:RRR  ,  Lungs: Decreased breath sounds but no crackles  ,  Abd:  Positive bowel sounds, no rebound no guarding, Ext Moderate to severe bilateral leg edema  .  All available labs, radiology testing, previous records reviewed. Agree with documented assessment and plan.  Acute diastolic HF with RV failure:    She continues to diurese well.  No SGLT2 at this time.  PE:  Continue Eliquis.     [name]  1:54 PM  06/28/2023

## 2023-06-28 NOTE — Progress Notes (Signed)
Hematology short note  .    Latest Ref Rng & Units 06/28/2023    5:18 AM 06/26/2023    4:45 AM 06/25/2023    5:07 AM  CBC  WBC 4.0 - 10.5 K/uL 8.2  7.7  7.5   Hemoglobin 12.0 - 15.0 g/dL 16.1  09.6  04.5   Hematocrit 36.0 - 46.0 % 46.0  46.6  48.8   Platelets 150 - 400 K/uL 66  68  67    PLT remain stable Will setup outpatient f/u in 3-4 week to f/u for resolution of thrombocytopenia and for her PE. Hopefully her platelets will drift back to her baseline once acute thrombotic issue and volume overload stabilizes.  Wyvonnia Lora MD MS

## 2023-06-28 NOTE — Consult Note (Signed)
NAME:  Carrie Guzman, MRN:  [id number], DOB:  1946/07/11, LOS: 7 ADMISSION DATE:  06/21/2023, CONSULTATION DATE:  06/23/2023 REFERRING MD:  Stephania Fragmin - TRH , CHIEF COMPLAINT:  PE   History of Present Illness:  Carrie Guzman is a 77yo female with a past medical history significant for HTN, HLD, squamous cell skin cancer, and obesity who presented to the ED 8/14 for worsening lower extremity swelling, dyspnea, and cough.  Workup on admission revealed elevated bilirubin, BNP and elevated troponin.  Patient was admitted per hospitalist for further management of acute failure.   Echocardiogram was completed on arrival and revealed preserved EF of 60 to 65% and no WMA but revealed grade 1 diastolic function with severe pulmonary artery hypertension with severe RA dilation and dysfunction consistent with cor pulmonale.  CTA chest was then obtained  which revealed right upper lobe segmental and subsegmental pulmonary emboli with CT evidence of right heart strain, large pericardial effusion, and moderate right pleural effusion.  Pulmonary critical care consulted for valuation of potential intervention to PE.   Pertinent  Medical History  HTN, HLD, squamous cell skin cancer, and obesity  Significant Hospital Events: Including procedures, antibiotic start and stop dates in addition to other pertinent events   8/15 presented with dyspnea, lower extremity edema, and cough found to be in acute right heart failure 8/16 CTA chest overnight consistent with acute PE, PCCM consulted for assistance in management  Interim History / Subjective:   Remains on 2L O2. Reports she is not sitting in chair and only stood up twice yesterday  Objective   Blood pressure 134/69, pulse 85, temperature 97.7 F (36.5 C), temperature source Oral, resp. rate 20, height 5\' 4"  (1.626 m), weight 117 kg, SpO2 91%.        Intake/Output Summary (Last 24 hours) at 06/28/2023 0842 Last data filed at 06/28/2023 0353 Gross  per 24 hour  Intake 920 ml  Output 2400 ml  Net -1480 ml   Filed Weights   06/26/23 0704 06/27/23 0452 06/28/23 0403  Weight: 116.9 kg 117.1 kg 117 kg    Physical Exam: General: Chronically ill-appearing, no acute distress HENT: [place], AT Eyes: EOMI, no scleral icterus Respiratory: Left basilar crackles Cardiovascular: RRR, -M/R/G, no JVD Extremities: Improving edema,-tenderness Neuro: AAO x4, CNII-XII grossly intact Psych: Normal mood, normal affect   LE dopplers - neg for DVT  Resolved Hospital Problem list     Assessment & Plan:  Acute hypoxemic respiratory failure 2/2 heart failure- unchanged O2 requirement Acute right upper lobe segmental and subsegmental pulmonary emboli - CTA chest on admit revealed RUL segmental and subsegmental pulmonary emboli with CT evidence of right heart strain -Patient's underlying lymphedema and venous stasis ulcers Limited mobility, this is likely contributing factor to PE Severe pulmonary hypertension with cor pulmonale -Multifactorial from heart failure, undiagnosed OSA/OHS, and PE Acute HFpEF/right-sided heart failure -2D echocardiogram obtained 8/15 revealed preserved EF of 60 to 65% with no WMA but revealed severe pulmonary hypertension with severe RV dilation and dysfunction consistent with cor pulmonale Moderate pericardial effusion -This is confirmed on 2D echocardiogram Lymphedema Thrombocytopenia - stable in 60s P: No indication for thrombolytics. Not a candidate for thrombectomy based on location of PEs On Eliquis Wean oxygen for goal >90% Cardiology following for right heart failure. Planning for RHC when further out from acute PE Agree with diuresis BID Strict intake and output  Daily assessment for need to diurese per primary/cardiology Closely monitor renal function and electrolytes  ORDER CXR today. May need to consider thoracentesis if indicated NIV with BIPAP thereapy may be indicated, scheduled with Dr. Aldean Ast on 8/28  for evaluation for sleep study  Best Practice (right click and "Reselect all SmartList Selections" daily)  Per primary   Critical care time: n/a   Care Time: 35 min  Mechele Collin, M.D. [facility] Pulmonary/Critical Care Medicine 06/28/2023 8:51 AM   See Amion for personal pager For hours between 7 PM to 7 AM, please call Elink for urgent questions

## 2023-06-29 DIAGNOSIS — I5031 Acute diastolic (congestive) heart failure: Secondary | ICD-10-CM | POA: Diagnosis not present

## 2023-06-29 LAB — COMPREHENSIVE METABOLIC PANEL
ALT: 18 U/L (ref 0–44)
AST: 24 U/L (ref 15–41)
Albumin: 2.7 g/dL — ABNORMAL LOW (ref 3.5–5.0)
Alkaline Phosphatase: 90 U/L (ref 38–126)
Anion gap: 8 (ref 5–15)
BUN: 15 mg/dL (ref 8–23)
CO2: 31 mmol/L (ref 22–32)
Calcium: 9.8 mg/dL (ref 8.9–10.3)
Chloride: 102 mmol/L (ref 98–111)
Creatinine, Ser: 0.52 mg/dL (ref 0.44–1.00)
GFR, Estimated: >60 mL/min (ref >60)
Glucose, Bld: 86 mg/dL (ref 70–99)
Potassium: 4.1 mmol/L (ref 3.5–5.1)
Sodium: 141 mmol/L (ref 135–145)
Total Bilirubin: 3.8 mg/dL — ABNORMAL HIGH (ref 0.3–1.2)
Total Protein: 6 g/dL — ABNORMAL LOW (ref 6.5–8.1)

## 2023-06-29 LAB — CBC
HCT: 46.3 % — ABNORMAL HIGH (ref 36.0–46.0)
Hemoglobin: 14.6 g/dL (ref 12.0–15.0)
MCH: 32.6 pg (ref 26.0–34.0)
MCHC: 31.5 g/dL (ref 30.0–36.0)
MCV: 103.3 fL — ABNORMAL HIGH (ref 80.0–100.0)
Platelets: 78 10*3/uL — ABNORMAL LOW (ref 150–400)
RBC: 4.48 MIL/uL (ref 3.87–5.11)
RDW: 17.3 % — ABNORMAL HIGH (ref 11.5–15.5)
WBC: 7.9 10*3/uL (ref 4.0–10.5)
nRBC: 0 % (ref 0.0–0.2)

## 2023-06-29 LAB — APTT
aPTT: >200 s (ref 24–36)
aPTT: 105 seconds — ABNORMAL HIGH (ref 24–36)

## 2023-06-29 LAB — HEPARIN LEVEL (UNFRACTIONATED): Heparin Unfractionated: >1.1 [IU]/mL — ABNORMAL HIGH (ref 0.30–0.70)

## 2023-06-29 LAB — MAGNESIUM: Magnesium: 1.9 mg/dL (ref 1.7–2.4)

## 2023-06-29 NOTE — Progress Notes (Signed)
PT Cancellation Note  Patient Details Name: Carrie Guzman MRN: [id number] DOB: 03-20-1946   Cancelled Treatment:     elevated Heparin levels today, will attempt to see another day as schedule permits.  Felecia Shelling  PTA Acute  Rehabilitation Services Office M-F          [phone]

## 2023-06-29 NOTE — Progress Notes (Signed)
Brief anti-coagulation note:  For full details see note from Clydell Hakim from earlier today  A/P: aPTT 105 slightly supra-therapeutic on 1150 units/hr Per RN no bleeding Decrease heparin drip to 1100 units/hr aPTT in 8 hours Daily CBC  Arley Phenix RPh 06/29/2023, 10:33 PM

## 2023-06-29 NOTE — Progress Notes (Signed)
PROGRESS NOTE    Carrie Guzman  MWN:[id number] DOB: [birth year] DOA: 06/21/2023 PCP: Melida Quitter, MD    Brief Narrative:  77 year old with history of lymphedema, HTN, obesity admitted to the hospital with concerns of shortness of breath ongoing for 2-3 weeks.  Patient was diagnosed with CHF exacerbation.  She had elevated BNP, total bilirubin was elevated.  Upon admission started on diuretics.  Echocardiogram showed preserved EF with elevated PASP, cor pulmonale and pericardial effusion without tamponade.  CTA chest showed acute pulmonary embolism but right side heart strain, case discussed with pulmonary who recommended heparin otherwise no indication for thrombectomy at this time.  Initial plan was to transfer patient to [facility] for RHC but given acute pulmonary embolism, will monitor the patient.  Eventually patient's heparin drip was transitioned to Eliquis as no further inpatient procedures were planned.  Patient was diuresed with IV Lasix to which she was responding well.  PT/OT did not require any further follow-up.     Assessment & Plan:  Principal Problem:   Acute CHF (congestive heart failure) (HCC) Active Problems:   Acute systolic (congestive) heart failure (HCC)   Acute respiratory distress with mild hypoxia Acute congestive heart failure, preserved ejection fraction" pulmonology, EF 60% Pericardial effusion without tamponade -Patient certainly has right-sided heart failure and signs of peripheral volume overload.e.  Patient seen by cardiology, eventually will need RHC once euvolemic.  Repeat echo in 2 weeks.  Tolerating Lasix well, continue   Acute right upper lobe and subsegmental pulmonary embolism without cor pulmonale Moderate pleural effusion, left - Seen by pulmonary, no indication for thrombectomy at this time.  Lower extremity Dopplers negative for DVT.  Will transition back to heparin drip in anticipation for possible thoracentesis in 48  hours.  Thrombocytopenia - Drifted down but around 66K and stable.  Seen by hematology.  No need to hold anticoagulation unless if she develops bleeding or platelets are below 30.  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  Will need to repeat in next 3-4 weeks   Elevated total bilirubin, improving - Total bilirubin slightly trending upwards again.  Will obtain right upper quadrant ultrasound   Lymphedema -this appears to be chronic   Hypokalemia -Replete as needed   Hyperlipidemia -continue home Crestor    Hypertension -IV prn and lasix   Morbid obesity -BMI 42.9, complicating all aspects of care    PT/OT-home health, face-to-face completed   DVT prophylaxis: Full dose anticoagulation Code Status: Full code Family Communication: Husband present at bedside Status is: Inpatient Remains inpatient appropriate because: Ongoing management for acute pulmonary embolism and CHF             Pressure Injury 06/26/23 Sacrum Lower Stage 1 -  Intact skin with non-blanchable redness of a localized area usually over a bony prominence.  (Active)  06/26/23 0946  Location: Sacrum  Location Orientation: Lower (lower sacrum)  Staging: Stage 1 -  Intact skin with non-blanchable redness of a localized area usually over a bony prominence.  Wound Description (Comments): -- (non-blanchable redness with small abrasion)  Present on Admission: No     Diet Orders (From admission, onward)     Start     Ordered   06/24/23 0815  Diet Heart Room service appropriate? Yes; Fluid consistency: Thin  Diet effective now       Question Answer Comment  Room service appropriate? Yes   Fluid consistency:  Thin      06/24/23 0815            Subjective: Seen at bedside, denying any complaints at this time.   Examination:  General exam: Appears calm and comfortable  Respiratory system: Bilateral diminished breath sounds at  the bases Cardiovascular system: S1 & S2 heard, RRR. No JVD, murmurs, rubs, gallops or clicks. No pedal edema. Gastrointestinal system: Abdomen is nondistended, soft and nontender. No organomegaly or masses felt. Normal bowel sounds heard. Central nervous system: Alert and oriented. No focal neurological deficits. Extremities: Symmetric 5 x 5 power. Skin: No rashes, lesions or ulcers Psychiatry: Judgement and insight appear normal. Mood & affect appropriate.  Objective: Vitals:   06/28/23 2022 06/29/23 0516 06/29/23 0538 06/29/23 1348  BP: 134/78 136/68  (!) 118/54  Pulse: 90 83  94  Resp: 19 18  14   Temp: 97.8 F (36.6 C) 97.8 F (36.6 C)  97.7 F (36.5 C)  TempSrc: Oral Oral  Oral  SpO2: 94% 93%  95%  Weight:   116.8 kg   Height:        Intake/Output Summary (Last 24 hours) at 06/29/2023 1421 Last data filed at 06/29/2023 0500 Gross per 24 hour  Intake 445.03 ml  Output 600 ml  Net -154.97 ml   Filed Weights   06/27/23 0452 06/28/23 0403 06/29/23 0538  Weight: 117.1 kg 117 kg 116.8 kg    Scheduled Meds:  Chlorhexidine Gluconate Cloth  6 each Topical Daily   furosemide  40 mg Intravenous BID   rosuvastatin  10 mg Oral Daily   Continuous Infusions:  heparin 1,150 Units/hr (06/29/23 1329)    Nutritional status     Body mass index is 44.2 kg/m.  Data Reviewed:   CBC: Recent Labs  Lab 06/24/23 0038 06/25/23 0507 06/26/23 0445 06/28/23 0518 06/29/23 0832  WBC 6.5 7.5 7.7 8.2 7.9  HGB 14.2 14.9 14.4 14.4 14.6  HCT 46.1* 48.8* 46.6* 46.0 46.3*  MCV 105.7* 105.6* 105.4* 103.1* 103.3*  PLT 66* 67* 68* 66* 78*   Basic Metabolic Panel: Recent Labs  Lab 06/23/23 0549 06/24/23 0038 06/25/23 0507 06/26/23 0445 06/27/23 0502 06/28/23 0518 06/29/23 0832  NA 141   < > 144 143 141 141 141  K 4.1   < > 3.7 3.8 3.3* 3.5 4.1  CL 106   < > 106 105 102 99 102  CO2 26   < > 30 32 33* 33* 31  GLUCOSE 99   < > 92 97 94 93 86  BUN 23   < > 20 17 17 15 15    CREATININE 0.45   < > 0.58 0.53 0.56 0.53 0.52  CALCIUM 9.9   < > 9.9 9.8 9.7 9.9 9.8  MG 1.9   < > 1.9 1.9 2.0 1.7 1.9  PHOS 2.1*  --   --   --   --   --   --    < > = values in this interval not displayed.   GFR: Estimated Creatinine Clearance: 76.3 mL/min (by C-G formula based on SCr of 0.52 mg/dL). Liver Function Tests: Recent Labs  Lab 06/25/23 0507 06/26/23 0445 06/27/23 0502 06/28/23 0518 06/29/23 0832  AST 17 17 17 21 24   ALT 16 15 15 15 18   ALKPHOS 84 84 87 81 90  BILITOT 3.8* 3.8* 4.1* 4.5* 3.8*  PROT 6.2* 5.9* 5.9* 6.0* 6.0*  ALBUMIN 2.9* 2.7* 2.7* 2.7* 2.7*   No results for input(s): "LIPASE", "AMYLASE" in  the last 168 hours. No results for input(s): "AMMONIA" in the last 168 hours. Coagulation Profile: No results for input(s): "INR", "PROTIME" in the last 168 hours. Cardiac Enzymes: No results for input(s): "CKTOTAL", "CKMB", "CKMBINDEX", "TROPONINI" in the last 168 hours. BNP (last 3 results) No results for input(s): "PROBNP" in the last 8760 hours. HbA1C: No results for input(s): "HGBA1C" in the last 72 hours. CBG: Recent Labs  Lab 06/23/23 0811 06/23/23 1140 06/23/23 1631 06/23/23 2056 06/24/23 2022  GLUCAP 98 102* 98 135* 111*   Lipid Profile: No results for input(s): "CHOL", "HDL", "LDLCALC", "TRIG", "CHOLHDL", "LDLDIRECT" in the last 72 hours. Thyroid Function Tests: No results for input(s): "TSH", "T4TOTAL", "FREET4", "T3FREE", "THYROIDAB" in the last 72 hours. Anemia Panel: No results for input(s): "VITAMINB12", "FOLATE", "FERRITIN", "TIBC", "IRON", "RETICCTPCT" in the last 72 hours. Sepsis Labs: No results for input(s): "PROCALCITON", "LATICACIDVEN" in the last 168 hours.  Recent Results (from the past 240 hour(s))  SARS Coronavirus 2 by RT PCR (hospital order, performed in [facility] hospital lab) *cepheid single result test* Anterior Nasal Swab     Status: None   Collection Time: 06/21/23 12:07 PM   Specimen: Anterior Nasal Swab   Result Value Ref Range Status   SARS Coronavirus 2 by RT PCR NEGATIVE NEGATIVE Final    Comment: (NOTE) SARS-CoV-2 target nucleic acids are NOT DETECTED.  The SARS-CoV-2 RNA is generally detectable in upper and lower respiratory specimens during the acute phase of infection. The lowest concentration of SARS-CoV-2 viral copies this assay can detect is 250 copies / mL. A negative result does not preclude SARS-CoV-2 infection and should not be used as the sole basis for treatment or other patient management decisions.  A negative result may occur with improper specimen collection / handling, submission of specimen other than nasopharyngeal swab, presence of viral mutation(s) within the areas targeted by this assay, and inadequate number of viral copies (<250 copies / mL). A negative result must be combined with clinical observations, patient history, and epidemiological information.  Fact Sheet for Patients:   RoadLapTop.co.za  Fact Sheet for Healthcare Providers: http://kim-miller.com/  This test is not yet approved or  cleared by the [place] FDA and has been authorized for detection and/or diagnosis of SARS-CoV-2 by FDA under an Emergency Use Authorization (EUA).  This EUA will remain in effect (meaning this test can be used) for the duration of the COVID-19 declaration under Section 564(b)(1) of the Act, 21 U.S.C. section 360bbb-3(b)(1), unless the authorization is terminated or revoked sooner.  Performed at [facility], [address] [place], [place] [address]          Radiology Studies: DG CHEST PORT 1 VIEW  Result Date: 06/28/2023 CLINICAL DATA:  Acute hypoxemic respiratory failure (HCC) EXAM: PORTABLE CHEST 1 VIEW COMPARISON:  06/21/2023. FINDINGS: Cardiomegaly and pulmonary vascular congestion. Edema. Right basilar opacities. No substantial change. No visible pneumothorax. IMPRESSION: Similar  cardiomegaly, pulmonary vascular congestion, small right pleural effusion right basilar opacities. Electronically Signed   By: Feliberto Harts M.D.   On: 06/28/2023 12:59   US Abdomen Limited RUQ (LIVER/GB)  Result Date: 06/27/2023 CLINICAL DATA:  Elevated total bilirubin EXAM: ULTRASOUND ABDOMEN LIMITED RIGHT UPPER QUADRANT COMPARISON:  None Available. FINDINGS: Gallbladder: Surgically absent. Common bile duct: Diameter: 1.6 mm Liver: No focal lesion identified. Increased in parenchymal echogenicity. Portal vein is patent on color Doppler imaging with normal direction of blood flow towards the liver. Other: Trace free fluid in the right upper quadrant. IMPRESSION:  1. Hepatic steatosis. 2. Trace free fluid in the right upper quadrant. Electronically Signed   By: Darliss Cheney M.D.   On: 06/27/2023 20:00           LOS: 8 days   Time spent= 35 mins    Miguel Rota, MD [facility]  If 7PM-7AM, please contact night-coverage  06/29/2023, 2:21 PM

## 2023-06-29 NOTE — Progress Notes (Addendum)
Patient Name: Carrie Guzman Date of Encounter: 06/29/2023 [facility] Cardiologist: Armanda Magic, MD   Interval Summary  .    Patient reports feeling better this AM. Feels more alert and energetic. Continues to feel short of breath   Vital Signs .    Vitals:   06/28/23 1351 06/28/23 2022 06/29/23 0516 06/29/23 0538  BP: (!) 148/68 134/78 136/68   Pulse: 91 90 83   Resp: 16 19 18    Temp: (!) 97.5 F (36.4 C) 97.8 F (36.6 C) 97.8 F (36.6 C)   TempSrc: Oral Oral Oral   SpO2: 93% 94% 93%   Weight:    116.8 kg  Height:        Intake/Output Summary (Last 24 hours) at 06/29/2023 0946 Last data filed at 06/29/2023 0500 Gross per 24 hour  Intake 685.03 ml  Output 1300 ml  Net -614.97 ml      06/29/2023    5:38 AM 06/28/2023    4:03 AM 06/27/2023    4:52 AM  Last 3 Weights  Weight (lbs) 257 lb 8 oz 257 lb 15 oz 258 lb 2.5 oz  Weight (kg) 116.8 kg 117 kg 117.1 kg      Telemetry/ECG    NSR with rare PACs, HR in the 80s-90s. Had a 5 second run of atrial tachycardia this AM  - Personally Reviewed  Physical Exam .   GEN: No acute distress.  Sitting upright in the bed. Wearing [place]  Neck: No JVD Cardiac: RRR, no murmurs, rubs, or gallops.  Respiratory: Crackles in bilateral lung bases. Diminished breath sounds in right lung base. Normal work of breathing on room air  GI: Soft, nontender, non-distended  MS: 3+ edema in BLE. Both lower extremities are erythematous with chronic venous stasis changes    Assessment & Plan .     Right sided heart failure  Severe pulmonary HTN  Moderate pleural effusion, left  Acute on chronic diastolic heart failure -Echocardiogram this admission with EF 60-65%, no regional wall motion abnormalities, grade 1 diastolic dysfunction, severely reduced RV function, severely elevated pulmonary artery systolic pressure -Initial BNP elevated to 1771 with chest x-ray on admission showing cardiomegaly, pulmonary vascular congestion,  interstitial edema -Patient has been on IV Lasix 40 mg twice daily-output 1.3 L urine yesterday and is currently net -12 L since admission.  Renal function is stable - Continue IV lasix  -Pulmonary hypertension is likely of mixed etiology: Combination of left-sided diastolic heart failure, suspected OSA/OHS that has been untreated, and acute PE -Planning to do a right heart catheterization in the outpatient setting to further characterize pulmonary hypertension.  Plan to do this after acute PE has resolved -Consider SGLT2 inhibitor-however, given patient's body habitus and low level of exertion, may put her at a high risk for UTI -Patient has also been followed by pulmonology this admission-yesterday, they obtained a bedside ultrasound of right thorax that showed single pocket with fluid that could be amenable to drainage.  Stay on considering thoracentesis if pleural effusion remains present after 48 hours  Suspected OSA and OHS -BMI 44.2 -Need outpatient sleep study  Acute PE  -Patient has been on Eliquis-however, was transition back to IV heparin yesterday in case of thoracentesis as above  Moderate Pericardial Effusion  -Echocardiogram this admission showed a moderate pericardial effusion without evidence of tamponade.  ANA, anti-DNA, and antiscleroderma studies were negative.  Inflammatory markers not elevated -Plan to repeat echocardiogram in 2 weeks or if there are hemodynamic  changes  Otherwise per primary  - Low TSH, elevated free T4- suspected euthyroid sick syndrome  - Elevated total bilitrubin  - Lymphedema, chronic  - Morbid obesity   For questions or updates, please contact [facility] Please consult [email or web address] for contact info under        Signed, Jonita Albee, PA-C   History and all data above reviewed.  Patient examined.  I agree with the findings as above.  Breathing slightly better.  Sitting up in the bed.  No pain.  The patient exam reveals  COR:RRR  ,  Lungs: Decreased breath sounds at the bases  ,  Abd: Positive bowel sounds, no rebound no guarding, Ext Moderate edema   .  All available labs, radiology testing, previous records reviewed. Agree with documented assessment and plan.   Acute diastolic HF with RV failure:  Tolerating diuresis.  Pleural effusion does look to be moderate by my view and it is reasonable to consider thoracentesis.  Otherwise continue current IV diuresis.  We will see as needed.    Fayrene Fearing Tearah Saulsbury  2:35 PM  06/29/2023

## 2023-06-29 NOTE — Plan of Care (Signed)
Monitor labs and vital signs. Monitor heparin drip and iv site. Continue current plan of care.

## 2023-06-29 NOTE — Progress Notes (Signed)
ANTICOAGULATION CONSULT NOTE - Follow Up Consult  Pharmacy Consult for heparin Indication: pulmonary embolus  No Known Allergies  Patient Measurements: Height: 5\' 4"  (162.6 cm) Weight: 116.8 kg (257 lb 8 oz) IBW/kg (Calculated) : 54.7 Heparin Dosing Weight: 83 kg  Vital Signs: Temp: 97.8 F (36.6 C) (08/22 0516) Temp Source: Oral (08/22 0516) BP: 136/68 (08/22 0516) Pulse Rate: 83 (08/22 0516)  Labs: Recent Labs    06/27/23 0502 06/28/23 0518 06/29/23 0832  HGB  --  14.4 14.6  HCT  --  46.0 46.3*  PLT  --  66* 78*  APTT  --   --  >200*  HEPARINUNFRC  --   --  >1.10*  CREATININE 0.56 0.53 0.52    Estimated Creatinine Clearance: 76.3 mL/min (by C-G formula based on SCr of 0.52 mg/dL).  Assessment: Transitioned back to heparin from Eliquis 8/21 in anticipation of possible paracentesis (last dose of Eliquis 0940 8/21). Per Heme/Onc consult hold anticoagulation if platelets <30k or any issues with bleeding.  Prior anticoagulation: 8/15 heparin >> 8/19 Eliquis >> 8/21 heparin  Today, 06/29/2023: CBC: Hgb WNL at 14.6. Platelets stable at 78. aPTT >200 (Confirmed with RN that sample drawn from opposite arm. No issues with testing per lab.)  supratherapeutic on heparin 1,400 units/hr Heparin level remains elevated d/t recent DOAC. Some nose bleeding and bruising per RN; no infusion issues noted.  Goal of Therapy: aPTT 66-102 seconds Monitor platelets by anticoagulation protocol: Yes  Plan: Hold heparin infusion for 1 hour. Retstart heparin  IV infusion at 1,150 units/hr Check aPTT 8 hrs after restart Daily CBC, daily heparin level Continue to titrate heparin based on aPTT levels until heparin levels correlate with aPTT. Monitor for signs of bleeding or thrombosis  Clydell Hakim 06/29/2023,12:23 PM

## 2023-06-30 DIAGNOSIS — I509 Heart failure, unspecified: Secondary | ICD-10-CM | POA: Diagnosis not present

## 2023-06-30 DIAGNOSIS — I5031 Acute diastolic (congestive) heart failure: Secondary | ICD-10-CM | POA: Diagnosis not present

## 2023-06-30 LAB — COMPREHENSIVE METABOLIC PANEL
ALT: 18 U/L (ref 0–44)
AST: 22 U/L (ref 15–41)
Albumin: 2.8 g/dL — ABNORMAL LOW (ref 3.5–5.0)
Alkaline Phosphatase: 91 U/L (ref 38–126)
Anion gap: 8 (ref 5–15)
BUN: 16 mg/dL (ref 8–23)
CO2: 33 mmol/L — ABNORMAL HIGH (ref 22–32)
Calcium: 9.9 mg/dL (ref 8.9–10.3)
Chloride: 98 mmol/L (ref 98–111)
Creatinine, Ser: 0.68 mg/dL (ref 0.44–1.00)
GFR, Estimated: >60 mL/min (ref >60)
Glucose, Bld: 95 mg/dL (ref 70–99)
Potassium: 3.5 mmol/L (ref 3.5–5.1)
Sodium: 139 mmol/L (ref 135–145)
Total Bilirubin: 3.8 mg/dL — ABNORMAL HIGH (ref 0.3–1.2)
Total Protein: 6.1 g/dL — ABNORMAL LOW (ref 6.5–8.1)

## 2023-06-30 LAB — APTT
aPTT: 135 s — ABNORMAL HIGH (ref 24–36)
aPTT: 51 seconds — ABNORMAL HIGH (ref 24–36)

## 2023-06-30 LAB — HEPARIN LEVEL (UNFRACTIONATED): Heparin Unfractionated: >1.1 [IU]/mL — ABNORMAL HIGH (ref 0.30–0.70)

## 2023-06-30 LAB — CBC
HCT: 46.3 % — ABNORMAL HIGH (ref 36.0–46.0)
Hemoglobin: 14.3 g/dL (ref 12.0–15.0)
MCH: 31.2 pg (ref 26.0–34.0)
MCHC: 30.9 g/dL (ref 30.0–36.0)
MCV: 100.9 fL — ABNORMAL HIGH (ref 80.0–100.0)
Platelets: 76 10*3/uL — ABNORMAL LOW (ref 150–400)
RBC: 4.59 MIL/uL (ref 3.87–5.11)
RDW: 17.2 % — ABNORMAL HIGH (ref 11.5–15.5)
WBC: 6.9 10*3/uL (ref 4.0–10.5)
nRBC: 0 % (ref 0.0–0.2)

## 2023-06-30 LAB — MAGNESIUM: Magnesium: 1.9 mg/dL (ref 1.7–2.4)

## 2023-06-30 MED ORDER — HEPARIN (PORCINE) 25000 UT/250ML-% IV SOLN
900.0000 [IU]/h | INTRAVENOUS | Status: DC
  Administered 2023-07-01: 900 [IU]/h via INTRAVENOUS
  Filled 2023-06-30: qty 250

## 2023-06-30 NOTE — Progress Notes (Signed)
Physical Therapy Treatment Patient Details Name: Carrie Guzman MRN: [id number] DOB: February 12, 1946 Today's Date: 06/30/2023   History of Present Illness 77 year old with history of lymphedema, HTN, obesity admitted to the hospital with concerns of shortness of breath ongoing for 2-3 weeks.  Patient was diagnosed with CHF exacerbation and severe pulmonary hypertension.  Also found to have acute right upper lobe and subsegmental pulmonary embolism without cor pulmonale.    PT Comments  General Comments: AxO x 3 pleasant and motivated.  Pt likes to wear her SHOES for better grip/support.  Pt has  B "bad" knees. Pt in bed on 2 lts.  Assisted OOB required increased time but pt self able.  General transfer comment: pt self able to rise from ELEVATED bed but required Mod Assist from lower recliner level due to her "bad knees".  Good safety cognition and use of hands to steady self. General Gait Details: assisted with amb in hallway while monitoring sats while Spouse followed with recliner.  Distance limited by 3/4 dyspnea.  Trial RA 86% with HR increasing to 133.  Reapplied 2 lts to achieve sats > 90% and Dyspnea remains limited at 3/4. Assisted back to bed per pt request/comfort.   Pt plans to D/C to home with Spouse.      If plan is discharge home, recommend the following:    SATURATION QUALIFICATIONS: (This note is used to comply with regulatory documentation for home oxygen)   Patient Saturations on Room Air at Rest = 89%   Patient Saturations on Room Air while Ambulating 25 feet = 86% HR increased to 133   Patient Saturations on 2 Liters of oxygen while Ambulating 25 feet= 91%   Please briefly explain why patient needs home oxygen:  Pt requires supplemental oxygen to achieve therapeutic  levels.     Can travel by private vehicle        Equipment Recommendations  Rolling walker (2 wheels) (Bariatric)    Recommendations for Other Services       Precautions / Restrictions  Precautions Precautions: Fall Precaution Comments: monitor sats, B "bad" knees (pt likes to wear her shoes) Restrictions Weight Bearing Restrictions: No     Mobility  Bed Mobility Overal bed mobility: Needs Assistance Bed Mobility: Supine to Sit, Sit to Supine     Supine to sit: Supervision Sit to supine: Supervision   General bed mobility comments: self able with increased time    Transfers Overall transfer level: Needs assistance Equipment used: Rolling walker (2 wheels) Transfers: Sit to/from Stand Sit to Stand: Supervision           General transfer comment: pt self able to rise from ELEVATED bed but required Mod Assist from lower recliner level due to her "bad knees".  Good safety cognition and use of hands to steady self.    Ambulation/Gait Ambulation/Gait assistance: Supervision, Contact guard assist Gait Distance (Feet): 50 Feet (25 feet x 2) Assistive device: Rolling walker (2 wheels) Gait Pattern/deviations: Step-to pattern, Decreased stride length, Trunk flexed Gait velocity: decreased     General Gait Details: assisted with amb in hallway while monitoring sats while Spouse followed with recliner.  Distance limited by 3/4 dyspnea.  Trial RA 86% with HR increasing to 133.  Reapplied 2 lts to achieve sats > 90% and Dyspnea remains limited at 3/4.   Stairs             Wheelchair Mobility     Tilt Bed    Modified Rankin (Stroke Patients Only)  Balance                                            Cognition Arousal: Alert Behavior During Therapy: WFL for tasks assessed/performed Overall Cognitive Status: Within Functional Limits for tasks assessed                                 General Comments: AxO x 3 pleasant and motivated.  Pt likes to wear her SHOES for better grip/support.  Pt has  B "bad" knees.        Exercises      General Comments        Pertinent Vitals/Pain Pain Assessment Pain  Assessment: No/denies pain    Home Living                          Prior Function            PT Goals (current goals can now be found in the care plan section) Progress towards PT goals: Progressing toward goals    Frequency    Min 1X/week      PT Plan      Co-evaluation              AM-PAC PT "6 Clicks" Mobility   Outcome Measure  Help needed turning from your back to your side while in a flat bed without using bedrails?: A Little Help needed moving from lying on your back to sitting on the side of a flat bed without using bedrails?: A Little Help needed moving to and from a bed to a chair (including a wheelchair)?: A Little Help needed standing up from a chair using your arms (e.g., wheelchair or bedside chair)?: A Little Help needed to walk in hospital room?: A Little Help needed climbing 3-5 steps with a railing? : A Lot 6 Click Score: 17    End of Session Equipment Utilized During Treatment: Oxygen;Gait belt Activity Tolerance: Patient limited by fatigue;Treatment limited secondary to medical complications (Comment) (PE) Patient left: in bed;with call bell/phone within reach;with family/visitor present Nurse Communication: Mobility status PT Visit Diagnosis: Difficulty in walking, not elsewhere classified (R26.2)     Time: 1130-1200 PT Time Calculation (min) (ACUTE ONLY): 30 min  Charges:    $Gait Training: 8-22 mins $Therapeutic Activity: 8-22 mins PT General Charges $$ ACUTE PT VISIT: 1 Visit                     Felecia Shelling  PTA Acute  Rehabilitation Services Office M-F          [phone]

## 2023-06-30 NOTE — Care Management Important Message (Signed)
Important Message  Patient Details IM Letter given. Name: Carrie Guzman MRN: [id number] Date of Birth: 1946/03/08   Medicare Important Message Given:  Yes     [name] 06/30/2023, 10:20 AM

## 2023-06-30 NOTE — Progress Notes (Signed)
PHYSICAL THERAPY  SATURATION QUALIFICATIONS: (This note is used to comply with regulatory documentation for home oxygen)  Patient Saturations on Room Air at Rest = 89%  Patient Saturations on Room Air while Ambulating 25 feet = 86% HR increased to 133  Patient Saturations on 2 Liters of oxygen while Ambulating 25 feet= 91%  Please briefly explain why patient needs home oxygen:  Pt requires supplemental oxygen to achieve therapeutic  levels.  Felecia Shelling  PTA Acute  Rehabilitation Services Office M-F          [phone]

## 2023-06-30 NOTE — Plan of Care (Signed)
  Problem: Coping: Goal: Level of anxiety will decrease Outcome: Progressing   Problem: Elimination: Goal: Will not experience complications related to bowel motility Outcome: Progressing Goal: Will not experience complications related to urinary retention Outcome: Progressing   Problem: Pain Managment: Goal: General experience of comfort will improve Outcome: Progressing   Problem: Activity: Goal: Ability to return to baseline activity level will improve Outcome: Progressing

## 2023-06-30 NOTE — Plan of Care (Signed)
  Problem: Education: Goal: Knowledge of General Education information will improve Description: Including pain rating scale, medication(s)/side effects and non-pharmacologic comfort measures Outcome: Progressing   Problem: Clinical Measurements: Goal: Will remain free from infection Outcome: Progressing   Problem: Elimination: Goal: Will not experience complications related to urinary retention Outcome: Progressing   Problem: Pain Managment: Goal: General experience of comfort will improve Outcome: Progressing   

## 2023-06-30 NOTE — Progress Notes (Signed)
ANTICOAGULATION CONSULT NOTE - Follow Up Consult  Pharmacy Consult for heparin Indication: pulmonary embolus  No Known Allergies  Patient Measurements: Height: 5\' 4"  (162.6 cm) Weight: 106.7 kg (235 lb 3.7 oz) IBW/kg (Calculated) : 54.7 Heparin Dosing Weight: 80 kg  Vital Signs: Temp: 98.4 F (36.9 C) (08/23 1325) BP: 140/68 (08/23 1325) Pulse Rate: 92 (08/23 1325)  Labs: Recent Labs    06/28/23 0518 06/28/23 0518 06/29/23 0832 06/29/23 2138 06/30/23 0434 06/30/23 0751 06/30/23 1658  HGB 14.4  --  14.6  --  14.3  --   --   HCT 46.0  --  46.3*  --  46.3*  --   --   PLT 66*  --  78*  --  76*  --   --   APTT  --    < > >200* 105*  --  135* 51*  HEPARINUNFRC  --   --  >1.10*  --  >1.10*  --   --   CREATININE 0.53  --  0.52  --  0.68  --   --    < > = values in this interval not displayed.    Estimated Creatinine Clearance: 72.4 mL/min (by C-G formula based on SCr of 0.68 mg/dL).  Assessment: Prior anticoagulation: 8/15 heparin >> 8/19 Eliquis >> 8/21 heparin  Transitioned back to heparin from Eliquis 8/21 in anticipation of possible paracentesis (last dose of Eliquis 8/21 at 0940). Per Heme/Onc consult hold anticoagulation if platelets <30k or any issues with bleeding.  Today, 06/30/2023: CBC: Hgb WNL at 14.3. Platelets stable at 76 aPTT 51 - now subtherapeutic after heparin reduced to 800 units/hr Heparin level remains elevated d/t recent DOAC RN noted new bruise on back of R thigh yesterday and slight orange colored urine this morning. No further issues noted this evening.  Goal of Therapy: aPTT 66-102 seconds Monitor platelets by anticoagulation protocol: Yes  Plan: Increase heparin infusion to 900 units/hr Check aPTT 6-8 hrs Daily CBC, heparin level Continue to titrate heparin based on aPTT levels until heparin levels correlate with aPTT. Monitor for signs of bleeding or thrombosis    Pricilla Riffle, PharmD, BCPS Clinical Pharmacist 06/30/2023 6:33  PM

## 2023-06-30 NOTE — Consult Note (Signed)
NAME:  Carrie Guzman, MRN:  [id number], DOB:  01-07-1946, LOS: 9 ADMISSION DATE:  06/21/2023, CONSULTATION DATE:  06/23/2023 REFERRING MD:  Stephania Fragmin - TRH , CHIEF COMPLAINT:  PE   History of Present Illness:  Carrie Guzman is a 77yo female with a past medical history significant for HTN, HLD, squamous cell skin cancer, and obesity who presented to the ED 8/14 for worsening lower extremity swelling, dyspnea, and cough.  Workup on admission revealed elevated bilirubin, BNP and elevated troponin.  Patient was admitted per hospitalist for further management of acute failure.   Echocardiogram was completed on arrival and revealed preserved EF of 60 to 65% and no WMA but revealed grade 1 diastolic function with severe pulmonary artery hypertension with severe RA dilation and dysfunction consistent with cor pulmonale.  CTA chest was then obtained  which revealed right upper lobe segmental and subsegmental pulmonary emboli with CT evidence of right heart strain, large pericardial effusion, and moderate right pleural effusion.  Pulmonary critical care consulted for valuation of potential intervention to PE.   Pertinent  Medical History  HTN, HLD, squamous cell skin cancer, and obesity  Significant Hospital Events: Including procedures, antibiotic start and stop dates in addition to other pertinent events   8/15 presented with dyspnea, lower extremity edema, and cough found to be in acute right heart failure 8/16 CTA chest overnight consistent with acute PE, PCCM consulted for assistance in management  Interim History / Subjective:   Remains on 2L O2 Net negative -13L  Objective   Blood pressure (!) 140/68, pulse 92, temperature 98.4 F (36.9 C), resp. rate 20, height 5\' 4"  (1.626 m), weight 106.7 kg, SpO2 94%.        Intake/Output Summary (Last 24 hours) at 06/30/2023 1729 Last data filed at 06/30/2023 1532 Gross per 24 hour  Intake 630.21 ml  Output 2250 ml  Net -1619.79 ml    Filed Weights   06/28/23 0403 06/29/23 0538 06/30/23 0500  Weight: 117 kg 116.8 kg 106.7 kg    Physical Exam: General: Chronically ill-appearing, no acute distress HENT: [place], AT, OP clear, MMM Eyes: EOMI, no scleral icterus Respiratory: Diminished bases to auscultation bilaterally.  No crackles, wheezing or rales Cardiovascular: RRR, -M/R/G, no JVD GI: BS+, soft, nontender Extremities:-Edema,-tenderness Neuro: AAO x4, CNII-XII grossly intact  Bedside left thoracic ultrasound with improved small left pleural effusion  Resolved Hospital Problem list     Assessment & Plan:  Acute hypoxemic respiratory failure 2/2 heart failure- unchanged O2 requirement Acute right upper lobe segmental and subsegmental pulmonary emboli - CTA chest on admit revealed RUL segmental and subsegmental pulmonary emboli with CT evidence of right heart strain -Patient's underlying lymphedema and venous stasis ulcers Limited mobility, this is likely contributing factor to PE Severe pulmonary hypertension with cor pulmonale -Multifactorial from heart failure, undiagnosed OSA/OHS, and PE Acute HFpEF/right-sided heart failure -2D echocardiogram obtained 8/15 revealed preserved EF of 60 to 65% with no WMA but revealed severe pulmonary hypertension with severe RV dilation and dysfunction consistent with cor pulmonale Moderate pericardial effusion -This is confirmed on 2D echocardiogram Lymphedema Thrombocytopenia - stable in 60s P: No indication for thrombolytics. Not a candidate for thrombectomy based on location of PEs On Eliquis Wean oxygen for goal >90% Cardiology following for right heart failure. Planning for RHC when further out from acute PE Agree with diuresis BID as tolerated Strict intake and output  Daily assessment for need to diurese per primary/cardiology Closely monitor renal function and electrolytes  No indication for thoracentesis as she is responding to diuresis NIV with BIPAP thereapy  may be indicated, scheduled with Dr. Aldean Ast on 8/28 for evaluation for sleep study  Best Practice (right click and "Reselect all SmartList Selections" daily)  Per primary   Critical care time: n/a   Pulmonary will be available as needed. Signing off now  Care Time: 35 min  Mechele Collin, M.D. [facility] Pulmonary/Critical Care Medicine 06/30/2023 5:29 PM   See Amion for personal pager For hours between 7 PM to 7 AM, please call Elink for urgent questions

## 2023-06-30 NOTE — Progress Notes (Signed)
PROGRESS NOTE    Carrie Guzman  UUV:[id number] DOB: 1946/11/06 DOA: 06/21/2023 PCP: Melida Quitter, MD    Brief Narrative:  77 year old with history of lymphedema, HTN, obesity admitted to the hospital with concerns of shortness of breath ongoing for 2-3 weeks.  Patient was diagnosed with CHF exacerbation.  She had elevated BNP, total bilirubin was elevated.  Upon admission started on diuretics.  Echocardiogram showed preserved EF with elevated PASP, cor pulmonale and pericardial effusion without tamponade.  CTA chest showed acute pulmonary embolism but right side heart strain, case discussed with pulmonary who recommended heparin otherwise no indication for thrombectomy at this time.  Initial plan was to transfer patient to [facility] for RHC but given acute pulmonary embolism, will monitor the patient.  Overall patient is responding well to IV diuretics but still remains somewhat hypoxic.  PT/OT recommended no further follow-up.  Due to somewhat persistent hypoxia, pulmonary team to evaluate for possible thoracentesis.  Patient is now on heparin drip in anticipation for any procedure.     Assessment & Plan:  Principal Problem:   Acute CHF (congestive heart failure) (HCC) Active Problems:   Acute systolic (congestive) heart failure (HCC)   Acute respiratory distress with mild hypoxia Acute congestive heart failure, preserved ejection fraction" pulmonology, EF 60% Pericardial effusion without tamponade -Patient certainly has right-sided heart failure and signs of peripheral volume overload.e.  Patient seen by cardiology, eventually will need RHC once euvolemic.  Repeat echo in 2 weeks.  Tolerating Lasix well, continue   Acute right upper lobe and subsegmental pulmonary embolism without cor pulmonale Moderate pleural effusion, left - Seen by pulmonary, no indication for thrombectomy at this time.  Lower extremity Dopplers negative for DVT.  Will transition back to heparin drip in  anticipation for possible thoracentesis today  Thrombocytopenia - Drifted down but around 66K and stable.  Seen by hematology.  No need to hold anticoagulation unless if she develops bleeding or platelets are below 30.  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  Will need to repeat in next 3-4 weeks   Elevated total bilirubin, improving - Total bilirubin slightly trending upwards again.  Will obtain right upper quadrant ultrasound   Lymphedema -this appears to be chronic   Hypokalemia -Replete as needed   Hyperlipidemia -continue home Crestor    Hypertension -IV prn and lasix   Morbid obesity -BMI 42.9, complicating all aspects of care    PT/OT-home health, face-to-face completed   DVT prophylaxis: Full dose anticoagulation Code Status: Full code Family Communication: Husband present at bedside Status is: Inpatient Remains inpatient appropriate because: Ongoing management for acute pulmonary embolism and CHF             Pressure Injury 06/26/23 Sacrum Lower Stage 1 -  Intact skin with non-blanchable redness of a localized area usually over a bony prominence.  (Active)  06/26/23 0946  Location: Sacrum  Location Orientation: Lower (lower sacrum)  Staging: Stage 1 -  Intact skin with non-blanchable redness of a localized area usually over a bony prominence.  Wound Description (Comments): -- (non-blanchable redness with small abrasion)  Present on Admission: No     Diet Orders (From admission, onward)     Start     Ordered   06/24/23 0815  Diet Heart Room service appropriate? Yes; Fluid consistency: Thin  Diet effective now       Question Answer Comment  Room service  appropriate? Yes   Fluid consistency: Thin      06/24/23 0815            Subjective:  Doing ok. No complaints.   Examination:  General exam: Appears calm and comfortable  Respiratory system: some bibasilar  crackles Cardiovascular system: S1 & S2 heard, RRR. No JVD, murmurs, rubs, gallops or clicks. Gastrointestinal system: Abdomen is nondistended, soft and nontender. No organomegaly or masses felt. Normal bowel sounds heard. Central nervous system: Alert and oriented. No focal neurological deficits. Extremities: Symmetric 5 x 5 power. Skin: b/l LE chronic erythema, swelling is coming down.  Psychiatry: Judgement and insight appear normal. Mood & affect appropriate.  Objective: Vitals:   06/29/23 1348 06/29/23 2234 06/30/23 0500 06/30/23 0505  BP: (!) 118/54 135/78  (!) 127/54  Pulse: 94 90  86  Resp: 14 18  18   Temp: 97.7 F (36.5 C) 97.6 F (36.4 C)  98 F (36.7 C)  TempSrc: Oral Oral  Oral  SpO2: 95% 95%  93%  Weight:   106.7 kg   Height:        Intake/Output Summary (Last 24 hours) at 06/30/2023 1249 Last data filed at 06/30/2023 1159 Gross per 24 hour  Intake 834.21 ml  Output 2250 ml  Net -1415.79 ml   Filed Weights   06/28/23 0403 06/29/23 0538 06/30/23 0500  Weight: 117 kg 116.8 kg 106.7 kg    Scheduled Meds:  furosemide  40 mg Intravenous BID   rosuvastatin  10 mg Oral Daily   Continuous Infusions:  heparin 800 Units/hr (06/30/23 1102)    Nutritional status     Body mass index is 40.38 kg/m.  Data Reviewed:   CBC: Recent Labs  Lab 06/25/23 0507 06/26/23 0445 06/28/23 0518 06/29/23 0832 06/30/23 0434  WBC 7.5 7.7 8.2 7.9 6.9  HGB 14.9 14.4 14.4 14.6 14.3  HCT 48.8* 46.6* 46.0 46.3* 46.3*  MCV 105.6* 105.4* 103.1* 103.3* 100.9*  PLT 67* 68* 66* 78* 76*   Basic Metabolic Panel: Recent Labs  Lab 06/26/23 0445 06/27/23 0502 06/28/23 0518 06/29/23 0832 06/30/23 0434  NA 143 141 141 141 139  K 3.8 3.3* 3.5 4.1 3.5  CL 105 102 99 102 98  CO2 32 33* 33* 31 33*  GLUCOSE 97 94 93 86 95  BUN 17 17 15 15 16   CREATININE 0.53 0.56 0.53 0.52 0.68  CALCIUM 9.8 9.7 9.9 9.8 9.9  MG 1.9 2.0 1.7 1.9 1.9   GFR: Estimated Creatinine Clearance: 72.4  mL/min (by C-G formula based on SCr of 0.68 mg/dL). Liver Function Tests: Recent Labs  Lab 06/26/23 0445 06/27/23 0502 06/28/23 0518 06/29/23 0832 06/30/23 0434  AST 17 17 21 24 22   ALT 15 15 15 18 18   ALKPHOS 84 87 81 90 91  BILITOT 3.8* 4.1* 4.5* 3.8* 3.8*  PROT 5.9* 5.9* 6.0* 6.0* 6.1*  ALBUMIN 2.7* 2.7* 2.7* 2.7* 2.8*   No results for input(s): "LIPASE", "AMYLASE" in the last 168 hours. No results for input(s): "AMMONIA" in the last 168 hours. Coagulation Profile: No results for input(s): "INR", "PROTIME" in the last 168 hours. Cardiac Enzymes: No results for input(s): "CKTOTAL", "CKMB", "CKMBINDEX", "TROPONINI" in the last 168 hours. BNP (last 3 results) No results for input(s): "PROBNP" in the last 8760 hours. HbA1C: No results for input(s): "HGBA1C" in the last 72 hours. CBG: Recent Labs  Lab 06/23/23 1631 06/23/23 2056 06/24/23 2022  GLUCAP 98 135* 111*   Lipid Profile: No results for input(s): "  CHOL", "HDL", "LDLCALC", "TRIG", "CHOLHDL", "LDLDIRECT" in the last 72 hours. Thyroid Function Tests: No results for input(s): "TSH", "T4TOTAL", "FREET4", "T3FREE", "THYROIDAB" in the last 72 hours. Anemia Panel: No results for input(s): "VITAMINB12", "FOLATE", "FERRITIN", "TIBC", "IRON", "RETICCTPCT" in the last 72 hours. Sepsis Labs: No results for input(s): "PROCALCITON", "LATICACIDVEN" in the last 168 hours.  Recent Results (from the past 240 hour(s))  SARS Coronavirus 2 by RT PCR (hospital order, performed in [facility] hospital lab) *cepheid single result test* Anterior Nasal Swab     Status: None   Collection Time: 06/21/23 12:07 PM   Specimen: Anterior Nasal Swab  Result Value Ref Range Status   SARS Coronavirus 2 by RT PCR NEGATIVE NEGATIVE Final    Comment: (NOTE) SARS-CoV-2 target nucleic acids are NOT DETECTED.  The SARS-CoV-2 RNA is generally detectable in upper and lower respiratory specimens during the acute phase of infection. The  lowest concentration of SARS-CoV-2 viral copies this assay can detect is 250 copies / mL. A negative result does not preclude SARS-CoV-2 infection and should not be used as the sole basis for treatment or other patient management decisions.  A negative result may occur with improper specimen collection / handling, submission of specimen other than nasopharyngeal swab, presence of viral mutation(s) within the areas targeted by this assay, and inadequate number of viral copies (<250 copies / mL). A negative result must be combined with clinical observations, patient history, and epidemiological information.  Fact Sheet for Patients:   RoadLapTop.co.za  Fact Sheet for Healthcare Providers: http://kim-miller.com/  This test is not yet approved or  cleared by the [place] FDA and has been authorized for detection and/or diagnosis of SARS-CoV-2 by FDA under an Emergency Use Authorization (EUA).  This EUA will remain in effect (meaning this test can be used) for the duration of the COVID-19 declaration under Section 564(b)(1) of the Act, 21 U.S.C. section 360bbb-3(b)(1), unless the authorization is terminated or revoked sooner.  Performed at [facility], [address], [place], [place] [address]          Radiology Studies: No results found.         LOS: 9 days   Time spent= 35 mins    Miguel Rota, MD [facility]  If 7PM-7AM, please contact night-coverage  06/30/2023, 12:49 PM

## 2023-06-30 NOTE — Progress Notes (Addendum)
ANTICOAGULATION CONSULT NOTE - Follow Up Consult  Pharmacy Consult for heparin Indication: pulmonary embolus  No Known Allergies  Patient Measurements: Height: 5\' 4"  (162.6 cm) Weight: 106.7 kg (235 lb 3.7 oz) IBW/kg (Calculated) : 54.7 Heparin Dosing Weight: 80 kg  Vital Signs: Temp: 98 F (36.7 C) (08/23 0505) Temp Source: Oral (08/23 0505) BP: 127/54 (08/23 0505) Pulse Rate: 86 (08/23 0505)  Labs: Recent Labs    06/28/23 0518 06/29/23 0832 06/29/23 2138 06/30/23 0434 06/30/23 0751  HGB 14.4 14.6  --  14.3  --   HCT 46.0 46.3*  --  46.3*  --   PLT 66* 78*  --  76*  --   APTT  --  >200* 105*  --  135*  HEPARINUNFRC  --  >1.10*  --  >1.10*  --   CREATININE 0.53 0.52  --  0.68  --     Estimated Creatinine Clearance: 72.4 mL/min (by C-G formula based on SCr of 0.68 mg/dL).  Assessment: Prior anticoagulation: 8/15 heparin >> 8/19 Eliquis >> 8/21 heparin  Transitioned back to heparin from Eliquis 8/21 in anticipation of possible paracentesis (last dose of Eliquis 8/21 at 0940). Per Heme/Onc consult hold anticoagulation if platelets <30k or any issues with bleeding.  Today, 06/30/2023: CBC: Hgb WNL at 14.3. Platelets stable at 76. aPTT 135 seconds (Confirmed with RN that sample was drawn from opposite arm.) Supratherapeutic on heparin 1,100 units/hr Heparin level remains elevated d/t recent DOAC. No nose bleeding or other bleeding issues today per RN. RN noted new bruise on back of R thigh yesterday and slight orange colored urine this morning.  Goal of Therapy: aPTT 66-102 seconds Monitor platelets by anticoagulation protocol: Yes  Plan: Hold heparin infusion for 1 hour. Restart heparin IV infusion at 800 units/hr. Check aPTT 6 hrs after restart. Daily CBC, daily heparin level Continue to titrate heparin based on aPTT levels until heparin levels correlate with aPTT. Monitor for signs of bleeding or thrombosis    Clydell Hakim 06/30/2023,9:59 AM  I discussed /  reviewed the pharmacy note by Dr. Glenetta Hew, PharmD candidate and I agree with the student's findings and plans as documented.  Gwynevere Lizana S. Merilynn Finland, PharmD, BCPS Clinical Staff Pharmacist [email or web address]

## 2023-07-01 DIAGNOSIS — J810 Acute pulmonary edema: Secondary | ICD-10-CM | POA: Diagnosis not present

## 2023-07-01 DIAGNOSIS — R0902 Hypoxemia: Secondary | ICD-10-CM

## 2023-07-01 DIAGNOSIS — E8770 Fluid overload, unspecified: Secondary | ICD-10-CM | POA: Diagnosis not present

## 2023-07-01 LAB — COMPREHENSIVE METABOLIC PANEL
ALT: 18 U/L (ref 0–44)
AST: 23 U/L (ref 15–41)
Albumin: 2.8 g/dL — ABNORMAL LOW (ref 3.5–5.0)
Alkaline Phosphatase: 92 U/L (ref 38–126)
Anion gap: 11 (ref 5–15)
BUN: 14 mg/dL (ref 8–23)
CO2: 29 mmol/L (ref 22–32)
Calcium: 9.8 mg/dL (ref 8.9–10.3)
Chloride: 98 mmol/L (ref 98–111)
Creatinine, Ser: 0.65 mg/dL (ref 0.44–1.00)
GFR, Estimated: >60 mL/min (ref >60)
Glucose, Bld: 85 mg/dL (ref 70–99)
Potassium: 3.1 mmol/L — ABNORMAL LOW (ref 3.5–5.1)
Sodium: 138 mmol/L (ref 135–145)
Total Bilirubin: 3.3 mg/dL — ABNORMAL HIGH (ref 0.3–1.2)
Total Protein: 6.3 g/dL — ABNORMAL LOW (ref 6.5–8.1)

## 2023-07-01 LAB — CBC
HCT: 45.2 % (ref 36.0–46.0)
Hemoglobin: 14.5 g/dL (ref 12.0–15.0)
MCH: 32.4 pg (ref 26.0–34.0)
MCHC: 32.1 g/dL (ref 30.0–36.0)
MCV: 101.1 fL — ABNORMAL HIGH (ref 80.0–100.0)
Platelets: 83 10*3/uL — ABNORMAL LOW (ref 150–400)
RBC: 4.47 MIL/uL (ref 3.87–5.11)
RDW: 17.1 % — ABNORMAL HIGH (ref 11.5–15.5)
WBC: 6.9 10*3/uL (ref 4.0–10.5)
nRBC: 0 % (ref 0.0–0.2)

## 2023-07-01 LAB — MAGNESIUM: Magnesium: 1.8 mg/dL (ref 1.7–2.4)

## 2023-07-01 LAB — HEPARIN LEVEL (UNFRACTIONATED): Heparin Unfractionated: 0.52 [IU]/mL (ref 0.30–0.70)

## 2023-07-01 LAB — APTT: aPTT: 73 s — ABNORMAL HIGH (ref 24–36)

## 2023-07-01 MED ORDER — APIXABAN 5 MG PO TABS
5.0000 mg | ORAL_TABLET | Freq: Two times a day (BID) | ORAL | Status: DC
  Administered 2023-07-01 – 2023-07-06 (×11): 5 mg via ORAL
  Filled 2023-07-01 (×11): qty 1

## 2023-07-01 MED ORDER — POTASSIUM CHLORIDE CRYS ER 20 MEQ PO TBCR
60.0000 meq | EXTENDED_RELEASE_TABLET | ORAL | Status: AC
  Administered 2023-07-01 (×2): 60 meq via ORAL
  Filled 2023-07-01 (×2): qty 3

## 2023-07-01 NOTE — Progress Notes (Signed)
Progress Note   Patient: Carrie Guzman OZH:[id number] DOB: 05/18/1946 DOA: 06/21/2023     10 DOS: the patient was seen and examined on 07/01/2023   Brief hospital course: 77 year old with history of lymphedema, HTN, obesity admitted to the hospital with concerns of shortness of breath ongoing for 2-3 weeks. Patient was diagnosed with CHF exacerbation. She had elevated BNP, total bilirubin was elevated. Upon admission started on diuretics. Echocardiogram showed preserved EF with elevated PASP, cor pulmonale and pericardial effusion without tamponade. CTA chest showed acute pulmonary embolism but right side heart strain, case discussed with pulmonary who recommended heparin otherwise no indication for thrombectomy at this time. Initial plan was to transfer patient to [facility] for RHC but given acute pulmonary embolism, will monitor the patient. Overall patient is responding well to IV diuretics but still remains somewhat hypoxic. PT/OT recommended no further follow-up.   Assessment and Plan: Acute respiratory distress with mild hypoxia Acute congestive heart failure, preserved ejection fraction" pulmonology, EF 60% Pericardial effusion without tamponade -Patient certainly has right-sided heart failure and signs of peripheral volume overload.e.  Patient seen by cardiology, eventually will need RHC once euvolemic.  Repeat echo in 2 weeks.  Tolerating Lasix well, continue   Acute right upper lobe and subsegmental pulmonary embolism without cor pulmonale Moderate pleural effusion, left - Seen by pulmonary, no indication for thrombectomy at this time.  Lower extremity Dopplers negative for DVT.   -Continued on heparin gtt   Thrombocytopenia - Drifted down but around 66K and stable.  Seen by hematology.  No need to hold anticoagulation unless if she develops bleeding or platelets are below 30.   Macrocytosis - B12 folate are normal   Low TSH and elevated free T4 - Likely euthyroid  sick syndrome.  Will need to repeat in next 3-4 weeks   Elevated total bilirubin, improving - Total bilirubin slightly trending upwards again.  Will obtain right upper quadrant ultrasound   Lymphedema -this appears to be chronic   Hypokalemia -Replete as needed   Hyperlipidemia -continue home Crestor    Hypertension -IV prn and lasix   Morbid obesity -BMI 42.9, complicating all aspects of care      Subjective: Without complaints this AM  Physical Exam: Vitals:   06/30/23 1948 07/01/23 0500 07/01/23 0528 07/01/23 1359  BP: 120/68  139/66 (!) 130/57  Pulse: 98  85 87  Resp: 20  20 16   Temp: 98 F (36.7 C)  98.1 F (36.7 C) 97.6 F (36.4 C)  TempSrc: Oral   Oral  SpO2: 93%  92% 92%  Weight:  108 kg    Height:       General exam: Awake, laying in bed, in nad Respiratory system: Normal respiratory effort, no wheezing Cardiovascular system: regular rate, s1, s2 Gastrointestinal system: Soft, nondistended, positive BS Central nervous system: CN2-12 grossly intact, strength intact Extremities: Perfused, no clubbing, BLE edema Skin: Normal skin turgor, no notable skin lesions seen Psychiatry: Mood normal // no visual hallucinations   Data Reviewed:  Labs reviewed: Na 138, K 3.1, Cr 0.65, WBC 6.9, Hgb 14.5, Plts 83  Family Communication: Pt in room, family at bedside  Disposition: Status is: Inpatient Remains inpatient appropriate because: Severity of illness  Planned Discharge Destination: Home with Home Health     Author: Rickey Barbara, MD 07/01/2023 5:59 PM  For on call review [email or web address].

## 2023-07-01 NOTE — Plan of Care (Signed)
  Problem: Clinical Measurements: Goal: Diagnostic test results will improve Outcome: Progressing   

## 2023-07-01 NOTE — Progress Notes (Signed)
ANTICOAGULATION CONSULT NOTE - Follow Up Consult  Pharmacy Consult for heparin Indication: pulmonary embolus  No Known Allergies  Patient Measurements: Height: 5\' 4"  (162.6 cm) Weight: 106.7 kg (235 lb 3.7 oz) IBW/kg (Calculated) : 54.7 Heparin Dosing Weight: 80 kg  Vital Signs: Temp: 98 F (36.7 C) (08/23 1948) Temp Source: Oral (08/23 1948) BP: 120/68 (08/23 1948) Pulse Rate: 98 (08/23 1948)  Labs: Recent Labs    06/28/23 0518 06/29/23 1610 06/29/23 2138 06/30/23 0434 06/30/23 0751 06/30/23 1658 07/01/23 0152  HGB 14.4 14.6  --  14.3  --   --   --   HCT 46.0 46.3*  --  46.3*  --   --   --   PLT 66* 78*  --  76*  --   --   --   APTT  --  >200*   < >  --  135* 51* 73*  HEPARINUNFRC  --  >1.10*  --  >1.10*  --   --  0.52  CREATININE 0.53 0.52  --  0.68  --   --   --    < > = values in this interval not displayed.    Estimated Creatinine Clearance: 72.4 mL/min (by C-G formula based on SCr of 0.68 mg/dL).  Assessment: Prior anticoagulation: 8/15 heparin >> 8/19 Eliquis >> 8/21 heparin  Transitioned back to heparin from Eliquis 8/21 in anticipation of possible paracentesis (last dose of Eliquis 8/21 at 0940). Per Heme/Onc consult hold anticoagulation if platelets <30k or any issues with bleeding.  Today, 07/01/2023: aPTT 73 therapeutic on 900 units/hr HL 0.52 also therapeutic No bleeding reported  Goal of Therapy: aPTT 66-102 seconds Monitor platelets by anticoagulation protocol: Yes  Plan: continue heparin infusion at 900 units/hr aPTT and heparin are correlating, will use heparin levels going forward Heparin level in 8 hours Daily CBC, heparin level Monitor for signs of bleeding or thrombosis    Arley Phenix RPh 07/01/2023, 3:05 AM

## 2023-07-02 DIAGNOSIS — R0902 Hypoxemia: Secondary | ICD-10-CM | POA: Diagnosis not present

## 2023-07-02 DIAGNOSIS — E8770 Fluid overload, unspecified: Secondary | ICD-10-CM | POA: Diagnosis not present

## 2023-07-02 DIAGNOSIS — J810 Acute pulmonary edema: Secondary | ICD-10-CM | POA: Diagnosis not present

## 2023-07-02 LAB — COMPREHENSIVE METABOLIC PANEL
ALT: 18 U/L (ref 0–44)
AST: 22 U/L (ref 15–41)
Albumin: 2.7 g/dL — ABNORMAL LOW (ref 3.5–5.0)
Alkaline Phosphatase: 90 U/L (ref 38–126)
Anion gap: 7 (ref 5–15)
BUN: 14 mg/dL (ref 8–23)
CO2: 31 mmol/L (ref 22–32)
Calcium: 9.9 mg/dL (ref 8.9–10.3)
Chloride: 100 mmol/L (ref 98–111)
Creatinine, Ser: 0.67 mg/dL (ref 0.44–1.00)
GFR, Estimated: >60 mL/min (ref >60)
Glucose, Bld: 86 mg/dL (ref 70–99)
Potassium: 3.6 mmol/L (ref 3.5–5.1)
Sodium: 138 mmol/L (ref 135–145)
Total Bilirubin: 3 mg/dL — ABNORMAL HIGH (ref 0.3–1.2)
Total Protein: 6.3 g/dL — ABNORMAL LOW (ref 6.5–8.1)

## 2023-07-02 LAB — MAGNESIUM: Magnesium: 2 mg/dL (ref 1.7–2.4)

## 2023-07-02 MED ORDER — LIDOCAINE 4 % EX CREA
TOPICAL_CREAM | Freq: Every day | CUTANEOUS | Status: DC | PRN
  Filled 2023-07-02: qty 5

## 2023-07-02 MED ORDER — POTASSIUM CHLORIDE CRYS ER 20 MEQ PO TBCR
60.0000 meq | EXTENDED_RELEASE_TABLET | Freq: Once | ORAL | Status: AC
  Administered 2023-07-02: 60 meq via ORAL
  Filled 2023-07-02: qty 3

## 2023-07-02 NOTE — Progress Notes (Signed)
Progress Note   Patient: Carrie Guzman ZOX:[id number] DOB: [birth year] DOA: 06/21/2023     11 DOS: the patient was seen and examined on 07/02/2023   Brief hospital course: 77 year old with history of lymphedema, HTN, obesity admitted to the hospital with concerns of shortness of breath ongoing for 2-3 weeks. Patient was diagnosed with CHF exacerbation. She had elevated BNP, total bilirubin was elevated. Upon admission started on diuretics. Echocardiogram showed preserved EF with elevated PASP, cor pulmonale and pericardial effusion without tamponade. CTA chest showed acute pulmonary embolism but right side heart strain, case discussed with pulmonary who recommended heparin otherwise no indication for thrombectomy at this time. Initial plan was to transfer patient to [facility] for RHC but given acute pulmonary embolism, will monitor the patient. Overall patient is responding well to IV diuretics but still remains somewhat hypoxic. PT/OT recommended no further follow-up.   Assessment and Plan: Acute respiratory distress with mild hypoxia Acute congestive heart failure, preserved ejection fraction" pulmonology, EF 60% Pericardial effusion without tamponade -Patient certainly has right-sided heart failure and signs of peripheral volume overload.e.  Patient seen by cardiology, eventually will need RHC once euvolemic.  Repeat echo in 2 weeks.  T -continues to diurese well with IV lasix -remains on 1LNC. Pt is O2 naive   Acute right upper lobe and subsegmental pulmonary embolism without cor pulmonale Moderate pleural effusion, left - Seen by pulmonary, no indication for thrombectomy at this time.  Lower extremity Dopplers negative for DVT.   -Now on eliquis   Thrombocytopenia - Drifted down but around 66K and stable.  Seen by hematology.  No need to hold anticoagulation unless if she develops bleeding or platelets are below 30.   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  Will need to repeat in next 3-4 weeks   Elevated total bilirubin, improving - Total bilirubin slightly trending upwards again.  Will obtain right upper quadrant ultrasound   Lymphedema -this appears to be chronic   Hypokalemia -Replete as needed   Hyperlipidemia -continue home Crestor    Hypertension -IV prn and lasix   Morbid obesity -BMI 42.9, complicating all aspects of care      Subjective: Complains of BLE discomfort  Physical Exam: Vitals:   07/02/23 0500 07/02/23 0501 07/02/23 0838 07/02/23 1256  BP:  137/62 (!) 148/80 (!) 150/72  Pulse:  89  83  Resp:  20 18 18   Temp:  97.6 F (36.4 C) 98.6 F (37 C) 97.7 F (36.5 C)  TempSrc:  Oral Oral Oral  SpO2:  91% 91% 93%  Weight: 103.8 kg     Height:       General exam: Conversant, in no acute distress Respiratory system: normal chest rise, clear, no audible wheezing Cardiovascular system: regular rhythm, s1-s2 Gastrointestinal system: Nondistended, nontender, pos BS Central nervous system: No seizures, no tremors Extremities: No cyanosis, no joint deformities, BLE edema improving Skin: No rashes, no pallor Psychiatry: Affect normal // no auditory hallucinations   Data Reviewed:  Labs reviewed: Na 138, K 3.6, Cr 0.67  Family Communication: Pt in room, family at bedside  Disposition: Status is: Inpatient Remains inpatient appropriate because: Severity of illness  Planned Discharge Destination: Home with Home Health     Author: Rickey Barbara, MD 07/02/2023 4:38 PM  For on call review [email or web address].

## 2023-07-02 NOTE — Plan of Care (Signed)
  Problem: Education: Goal: Knowledge of General Education information will improve Description Including pain rating scale, medication(s)/side effects and non-pharmacologic comfort measures Outcome: Progressing   Problem: Clinical Measurements: Goal: Ability to maintain clinical measurements within normal limits will improve Outcome: Progressing Goal: Will remain free from infection Outcome: Progressing Goal: Cardiovascular complication will be avoided Outcome: Progressing   Problem: Activity: Goal: Risk for activity intolerance will decrease Outcome: Progressing   Problem: Nutrition: Goal: Adequate nutrition will be maintained Outcome: Progressing   Problem: Coping: Goal: Level of anxiety will decrease Outcome: Progressing   Problem: Elimination: Goal: Will not experience complications related to bowel motility Outcome: Progressing Goal: Will not experience complications related to urinary retention Outcome: Progressing   Problem: Pain Managment: Goal: General experience of comfort will improve Outcome: Progressing   Problem: Safety: Goal: Ability to remain free from injury will improve Outcome: Progressing   Problem: Skin Integrity: Goal: Risk for impaired skin integrity will decrease Outcome: Progressing   

## 2023-07-02 NOTE — Progress Notes (Signed)
Physical Therapy Treatment Patient Details Name: Carrie Guzman MRN: [id number] DOB: 08-16-1946 Today's Date: 07/02/2023   History of Present Illness 77 year old with history of lymphedema, HTN, obesity admitted to the hospital with concerns of shortness of breath ongoing for 2-3 weeks.  Patient was diagnosed with CHF exacerbation and severe pulmonary hypertension.  Also found to have acute right upper lobe and subsegmental pulmonary embolism without cor pulmonale.    PT Comments  Pt assisted with ambulating in hallway and remained on 1-2L O2 [place] (see mobility section below).  Pt very pleasant and motivated.    If plan is discharge home, recommend the following: A little help with walking and/or transfers;A little help with bathing/dressing/bathroom;Assist for transportation;Help with stairs or ramp for entrance   Can travel by private vehicle        Equipment Recommendations  Rolling walker (2 wheels) (wide RW)    Recommendations for Other Services       Precautions / Restrictions Precautions Precautions: Fall Precaution Comments: monitor sats, B "bad" knees (pt likes to wear her shoes)     Mobility  Bed Mobility Overal bed mobility: Needs Assistance Bed Mobility: Supine to Sit, Sit to Supine     Supine to sit: Supervision Sit to supine: Supervision        Transfers Overall transfer level: Needs assistance Equipment used: Rolling walker (2 wheels) Transfers: Sit to/from Stand Sit to Stand: Supervision, From elevated surface           General transfer comment: pt self able to rise from ELEVATED bed    Ambulation/Gait Ambulation/Gait assistance: Contact guard assist, Supervision Gait Distance (Feet): 60 Feet Assistive device: Rolling walker (2 wheels) Gait Pattern/deviations: Step-to pattern, Decreased stride length, Trunk flexed Gait velocity: decreased     General Gait Details: pt ambulated in hallway with recliner following however did not require  seated rest break; cues for pursed lip breathing; Spo2 89% on 1L O2 during ambulation so increased to 2L and SPO2 92% upon returning to room; distance to tolerance   Stairs             Wheelchair Mobility     Tilt Bed    Modified Rankin (Stroke Patients Only)       Balance                                            Cognition Arousal: Alert Behavior During Therapy: WFL for tasks assessed/performed Overall Cognitive Status: Within Functional Limits for tasks assessed                                          Exercises      General Comments        Pertinent Vitals/Pain Pain Assessment Pain Assessment: No/denies pain    Home Living                          Prior Function            PT Goals (current goals can now be found in the care plan section) Progress towards PT goals: Progressing toward goals    Frequency    Min 1X/week      PT Plan      Co-evaluation  AM-PAC PT "6 Clicks" Mobility   Outcome Measure  Help needed turning from your back to your side while in a flat bed without using bedrails?: A Little Help needed moving from lying on your back to sitting on the side of a flat bed without using bedrails?: A Little Help needed moving to and from a bed to a chair (including a wheelchair)?: A Little Help needed standing up from a chair using your arms (e.g., wheelchair or bedside chair)?: A Little Help needed to walk in hospital room?: A Little Help needed climbing 3-5 steps with a railing? : A Lot 6 Click Score: 17    End of Session Equipment Utilized During Treatment: Oxygen Activity Tolerance: Patient tolerated treatment well Patient left: in bed;with call bell/phone within reach;with family/visitor present Nurse Communication: Mobility status PT Visit Diagnosis: Difficulty in walking, not elsewhere classified (R26.2)     Time: 9147-8295 PT Time Calculation (min) (ACUTE  ONLY): 18 min  Charges:    $Gait Training: 8-22 mins PT General Charges $$ ACUTE PT VISIT: 1 Visit                    Paulino Door, DPT Physical Therapist Acute Rehabilitation Services Office: [phone]  Janan Halter [place] 07/02/2023, 4:04 PM

## 2023-07-03 DIAGNOSIS — R0902 Hypoxemia: Secondary | ICD-10-CM | POA: Diagnosis not present

## 2023-07-03 DIAGNOSIS — E8770 Fluid overload, unspecified: Secondary | ICD-10-CM | POA: Diagnosis not present

## 2023-07-03 DIAGNOSIS — J810 Acute pulmonary edema: Secondary | ICD-10-CM | POA: Diagnosis not present

## 2023-07-03 LAB — COMPREHENSIVE METABOLIC PANEL
ALT: 17 U/L (ref 0–44)
AST: 23 U/L (ref 15–41)
Albumin: 2.7 g/dL — ABNORMAL LOW (ref 3.5–5.0)
Alkaline Phosphatase: 87 U/L (ref 38–126)
Anion gap: 6 (ref 5–15)
BUN: 14 mg/dL (ref 8–23)
CO2: 32 mmol/L (ref 22–32)
Calcium: 10 mg/dL (ref 8.9–10.3)
Chloride: 101 mmol/L (ref 98–111)
Creatinine, Ser: 0.85 mg/dL (ref 0.44–1.00)
GFR, Estimated: >60 mL/min (ref >60)
Glucose, Bld: 85 mg/dL (ref 70–99)
Potassium: 3.8 mmol/L (ref 3.5–5.1)
Sodium: 139 mmol/L (ref 135–145)
Total Bilirubin: 3.2 mg/dL — ABNORMAL HIGH (ref 0.3–1.2)
Total Protein: 6.2 g/dL — ABNORMAL LOW (ref 6.5–8.1)

## 2023-07-03 LAB — MAGNESIUM: Magnesium: 1.9 mg/dL (ref 1.7–2.4)

## 2023-07-03 NOTE — Progress Notes (Signed)
Progress Note   Patient: Carrie Guzman ION:[id number] DOB: 08-17-1946 DOA: 06/21/2023     12 DOS: the patient was seen and examined on 07/03/2023   Brief hospital course: 77 year old with history of lymphedema, HTN, obesity admitted to the hospital with concerns of shortness of breath ongoing for 2-3 weeks. Patient was diagnosed with CHF exacerbation. She had elevated BNP, total bilirubin was elevated. Upon admission started on diuretics. Echocardiogram showed preserved EF with elevated PASP, cor pulmonale and pericardial effusion without tamponade. CTA chest showed acute pulmonary embolism but right side heart strain, case discussed with pulmonary who recommended heparin otherwise no indication for thrombectomy at this time. Initial plan was to transfer patient to [facility] for RHC but given acute pulmonary embolism, will monitor the patient. Overall patient is responding well to IV diuretics but still remains somewhat hypoxic. PT/OT recommended no further follow-up.   Assessment and Plan: Acute respiratory distress with mild hypoxia Acute congestive heart failure, preserved ejection fraction" pulmonology, EF 60% Pericardial effusion without tamponade -Patient certainly has right-sided heart failure and signs of peripheral volume overload.e.  Patient seen by cardiology, eventually will need RHC once euvolemic.  Repeat echo in 2 weeks.  -continues to diurese well with IV lasix -Wt down to 103kg from 123kg on presentation -remains on 1LNC. Pt is O2 naive -recheck bmet in AM   Acute right upper lobe and subsegmental pulmonary embolism without cor pulmonale Moderate pleural effusion, left - Seen by pulmonary, no indication for thrombectomy at this time.  Lower extremity Dopplers negative for DVT.   -Now on eliquis   Thrombocytopenia - Drifted down but around 66K and stable.  Seen by hematology.  No need to hold anticoagulation unless if she develops bleeding or platelets are below  30.   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  Will need to repeat in next 3-4 weeks   Elevated total bilirubin, improving - Total bilirubin slightly trending upwards again.  Will obtain right upper quadrant ultrasound   Lymphedema -this appears to be chronic   Hypokalemia -Replete as needed   Hyperlipidemia -continue home Crestor    Hypertension -IV prn and lasix   Morbid obesity -BMI 42.9, complicating all aspects of care      Subjective:States LE discomfort is improved with lidocaine  Physical Exam: Vitals:   07/02/23 2038 07/03/23 0457 07/03/23 0700 07/03/23 1316  BP: (!) 131/58 136/72  110/63  Pulse: 92 84  91  Resp: (!) 22 20  18   Temp: 97.7 F (36.5 C) 98 F (36.7 C)  98.3 F (36.8 C)  TempSrc: Oral Axillary  Oral  SpO2: 92% 92%  93%  Weight:   103.1 kg   Height:       General exam: Awake, laying in bed, in nad Respiratory system: Normal respiratory effort, no wheezing Cardiovascular system: regular rate, s1, s2 Gastrointestinal system: Soft, nondistended, positive BS Central nervous system: CN2-12 grossly intact, strength intact Extremities: Perfused, no clubbing, BLE edema improving Skin: Normal skin turgor, no notable skin lesions seen Psychiatry: Mood normal // no visual hallucinations   Data Reviewed:  Labs reviewed: Na 139, K 3.8, Cr 0.85  Family Communication: Pt in room, family at bedside  Disposition: Status is: Inpatient Remains inpatient appropriate because: Severity of illness  Planned Discharge Destination: Home with Home Health     Author: Rickey Barbara, MD 07/03/2023 5:26 PM  For on call review [email or web address].

## 2023-07-03 NOTE — Progress Notes (Signed)
Heart Failure Navigator Progress Note  Assessed for Heart & Vascular TOC clinic readiness.  Patient does not meet criteria due to follow up appointment with [facility] on 07/21/23.   Navigator will sign off at this time.  Calista Crain,RN, BSN,MSN Heart Failure Nurse Navigator. Contact by secure chat only.

## 2023-07-03 NOTE — Care Management Important Message (Signed)
Important Message  Patient Details IM Letter given. Name: Aia Hedges MRN: [id number] Date of Birth: 1946-10-03   Medicare Important Message Given:  Yes     [name] 07/03/2023, 2:30 PM

## 2023-07-03 NOTE — Progress Notes (Signed)
Occupational Therapy Treatment Patient Details Name: Carrie Guzman MRN: [id number] DOB: 1946/07/01 Today's Date: 07/03/2023   History of present illness 77 year old with history of lymphedema, HTN, obesity admitted to the hospital on 06/21/23 with concerns of shortness of breath ongoing for 2-3 weeks.  Patient was diagnosed with CHF exacerbation and severe pulmonary hypertension.  Also found to have acute right upper lobe and subsegmental pulmonary embolism without cor pulmonale.   OT comments  Pt agreeable to sit EOB and for sit - stand transitions, but declined walking to bathroom and toilet.chair transfers due to fatigue. Pt reports that she is tired from earlier PT session. Pt sat EOB with Sup for grooming and activity tolerance in sitting, pt sat EOB ~5-6 minutes. OT will continue to follow acutely to maximize level of function and safety      If plan is discharge home, recommend the following:  Assistance with cooking/housework;A little help with bathing/dressing/bathroom;A little help with walking and/or transfers;Assist for transportation   Equipment Recommendations  None recommended by OT    Recommendations for Other Services      Precautions / Restrictions Precautions Precautions: Fall Restrictions Weight Bearing Restrictions: No       Mobility Bed Mobility Overal bed mobility: Needs Assistance Bed Mobility: Supine to Sit, Sit to Supine     Supine to sit: Supervision, HOB elevated     General bed mobility comments: with increased time, np physical assist    Transfers Overall transfer level: Needs assistance Equipment used: 1 person hand held assist Transfers: Sit to/from Stand Sit to Stand: Supervision, From elevated surface           General transfer comment: Pt declined walking to bathroom or sitting in chair due to fatigue from PT session earlier. Pt agreeabel to sit - stand transitions and EOB sitting tolerance tasks     Balance Overall balance  assessment: Needs assistance Sitting-balance support: No upper extremity supported Sitting balance-Leahy Scale: Good     Standing balance support: Bilateral upper extremity supported, During functional activity, Reliant on assistive device for balance Standing balance-Leahy Scale: Poor                             ADL either performed or assessed with clinical judgement   ADL Overall ADL's : Needs assistance/impaired     Grooming: Wash/dry hands;Wash/dry face;Set up;Sitting                                 General ADL Comments: pt declined toilet and chair transfers due to fatigue. pt agreeable to sit EOB and  pt sat EOB ~5-6 minutes for grooming and acitivty tolerance    Extremity/Trunk Assessment Upper Extremity Assessment Upper Extremity Assessment: Generalized weakness   Lower Extremity Assessment Lower Extremity Assessment: Defer to PT evaluation        Vision Ability to See in Adequate Light: 0 Adequate Patient Visual Report: No change from baseline     Perception     Praxis      Cognition Arousal: Alert Behavior During Therapy: [facility] for tasks assessed/performed Overall Cognitive Status: Within Functional Limits for tasks assessed                                          Exercises  Shoulder Instructions       General Comments Pt on 1 L O2 with sats 95% rest, down to 87% walking, increased to 2 L for recovery and up to 93% in <2 mins.  Transitioned back to 1 L with sats stable    Pertinent Vitals/ Pain       Pain Assessment Pain Assessment: No/denies pain  Home Living                                          Prior Functioning/Environment              Frequency  Min 1X/week        Progress Toward Goals  OT Goals(current goals can now be found in the care plan section)  Progress towards OT goals: OT to reassess next treatment     Plan      Co-evaluation                  AM-PAC OT "6 Clicks" Daily Activity     Outcome Measure   Help from another person eating meals?: None Help from another person taking care of personal grooming?: A Little Help from another person toileting, which includes using toliet, bedpan, or urinal?: A Little Help from another person bathing (including washing, rinsing, drying)?: A Lot Help from another person to put on and taking off regular upper body clothing?: A Little Help from another person to put on and taking off regular lower body clothing?: A Lot 6 Click Score: 17    End of Session Equipment Utilized During Treatment: Oxygen  OT Visit Diagnosis: Muscle weakness (generalized) (M62.81);Unsteadiness on feet (R26.81)   Activity Tolerance Patient limited by fatigue   Patient Left with call bell/phone within reach;in bed   Nurse Communication          Time: 1610-9604 OT Time Calculation (min): 11 min  Charges: OT General Charges $OT Visit: 1 Visit OT Treatments $Therapeutic Activity: 8-22 mins    Galen Manila 07/03/2023, 4:07 PM

## 2023-07-03 NOTE — Progress Notes (Signed)
Physical Therapy Treatment Patient Details Name: Carrie Guzman MRN: [id number] DOB: 10/22/1946 Today's Date: 07/03/2023   History of Present Illness 77 year old with history of lymphedema, HTN, obesity admitted to the hospital on 06/21/23 with concerns of shortness of breath ongoing for 2-3 weeks.  Patient was diagnosed with CHF exacerbation and severe pulmonary hypertension.  Also found to have acute right upper lobe and subsegmental pulmonary embolism without cor pulmonale.    PT Comments  Pt making gradual progress and able to increase gait distance with standing rest breaks.  She was on 1 L O2 but sats down to 87% requiring cues for pursed lip breathing and 2 L to recover.  Pt has limited bil knee flexion (reports arthritis) and required elevated surfaces to stand from (home set up with elevated surfaces).  Continue to progress as able.     If plan is discharge home, recommend the following: A little help with walking and/or transfers;A little help with bathing/dressing/bathroom;Assist for transportation;Help with stairs or ramp for entrance   Can travel by private vehicle        Equipment Recommendations  Rolling walker (2 wheels) (wider RW)    Recommendations for Other Services       Precautions / Restrictions Precautions Precautions: Fall     Mobility  Bed Mobility Overal bed mobility: Needs Assistance Bed Mobility: Supine to Sit, Sit to Supine     Supine to sit: Supervision, HOB elevated Sit to supine: Supervision, HOB elevated   General bed mobility comments: self able with increased time    Transfers Overall transfer level: Needs assistance Equipment used: Rolling walker (2 wheels) Transfers: Sit to/from Stand Sit to Stand: Supervision, From elevated surface           General transfer comment: Performed x 3, bed elevated, pt with limited knee flexion due to arthritis requiring bed elevated and then steps feet back as standing.  Pt has elevated bed at  home, has her kitchen chair lifted with cushions, rides in higher SUV, and when standing from her chair at home uses rocking motion for momentum.    Ambulation/Gait Ambulation/Gait assistance: Contact guard assist, Supervision Gait Distance (Feet): 110 Feet Assistive device: Rolling walker (2 wheels) Gait Pattern/deviations: Step-to pattern, Decreased stride length, Trunk flexed Gait velocity: decreased     General Gait Details: Ambulated increased distance but required 5 standing rest breaks (in last 50 feet); cues for pursed lip breathing   Stairs             Wheelchair Mobility     Tilt Bed    Modified Rankin (Stroke Patients Only)       Balance Overall balance assessment: Needs assistance Sitting-balance support: No upper extremity supported Sitting balance-Leahy Scale: Good     Standing balance support: Bilateral upper extremity supported, During functional activity, Reliant on assistive device for balance Standing balance-Leahy Scale: Poor Standing balance comment: required RW                            Cognition Arousal: Alert Behavior During Therapy: WFL for tasks assessed/performed Overall Cognitive Status: Within Functional Limits for tasks assessed                                 General Comments: AxO x 4 pleasant and motivated.        Exercises General Exercises - Lower Extremity Ankle Circles/Pumps: AROM,  Right, 10 reps, Seated Long Arc Quad: AROM, Both, 15 reps, Seated Hip Flexion/Marching: AROM, Both, 15 reps, Seated Other Exercises Other Exercises: Cues for full ROM    General Comments General comments (skin integrity, edema, etc.): Pt on 1 L O2 with sats 95% rest, down to 87% walking, increased to 2 L for recovery and up to 93% in <2 mins.  Transitioned back to 1 L with sats stable      Pertinent Vitals/Pain Pain Assessment Pain Assessment: No/denies pain    Home Living                           Prior Function            PT Goals (current goals can now be found in the care plan section) Progress towards PT goals: Progressing toward goals    Frequency    Min 1X/week      PT Plan      Co-evaluation              AM-PAC PT "6 Clicks" Mobility   Outcome Measure  Help needed turning from your back to your side while in a flat bed without using bedrails?: A Little Help needed moving from lying on your back to sitting on the side of a flat bed without using bedrails?: A Little Help needed moving to and from a bed to a chair (including a wheelchair)?: A Little Help needed standing up from a chair using your arms (e.g., wheelchair or bedside chair)?: A Little Help needed to walk in hospital room?: A Little Help needed climbing 3-5 steps with a railing? : A Lot 6 Click Score: 17    End of Session Equipment Utilized During Treatment: Oxygen Activity Tolerance: Patient tolerated treatment well Patient left: in bed;with call bell/phone within reach;with family/visitor present;with bed alarm set Nurse Communication: Mobility status PT Visit Diagnosis: Difficulty in walking, not elsewhere classified (R26.2)     Time: 1610-9604 PT Time Calculation (min) (ACUTE ONLY): 28 min  Charges:    $Gait Training: 8-22 mins $Therapeutic Activity: 8-22 mins PT General Charges $$ ACUTE PT VISIT: 1 Visit                     Anise Salvo, PT Acute Rehab Services [facility] [phone]    Rayetta Humphrey 07/03/2023, 3:11 PM

## 2023-07-04 DIAGNOSIS — R0902 Hypoxemia: Secondary | ICD-10-CM | POA: Diagnosis not present

## 2023-07-04 DIAGNOSIS — J810 Acute pulmonary edema: Secondary | ICD-10-CM | POA: Diagnosis not present

## 2023-07-04 DIAGNOSIS — E8770 Fluid overload, unspecified: Secondary | ICD-10-CM | POA: Diagnosis not present

## 2023-07-04 LAB — CBC
HCT: 45.8 % (ref 36.0–46.0)
Hemoglobin: 14.4 g/dL (ref 12.0–15.0)
MCH: 31.6 pg (ref 26.0–34.0)
MCHC: 31.4 g/dL (ref 30.0–36.0)
MCV: 100.7 fL — ABNORMAL HIGH (ref 80.0–100.0)
Platelets: 92 10*3/uL — ABNORMAL LOW (ref 150–400)
RBC: 4.55 MIL/uL (ref 3.87–5.11)
RDW: 17.2 % — ABNORMAL HIGH (ref 11.5–15.5)
WBC: 6.6 10*3/uL (ref 4.0–10.5)
nRBC: 0 % (ref 0.0–0.2)

## 2023-07-04 LAB — COMPREHENSIVE METABOLIC PANEL
ALT: 21 U/L (ref 0–44)
AST: 27 U/L (ref 15–41)
Albumin: 2.9 g/dL — ABNORMAL LOW (ref 3.5–5.0)
Alkaline Phosphatase: 99 U/L (ref 38–126)
Anion gap: 7 (ref 5–15)
BUN: 15 mg/dL (ref 8–23)
CO2: 32 mmol/L (ref 22–32)
Calcium: 9.9 mg/dL (ref 8.9–10.3)
Chloride: 98 mmol/L (ref 98–111)
Creatinine, Ser: 0.81 mg/dL (ref 0.44–1.00)
GFR, Estimated: >60 mL/min (ref >60)
Glucose, Bld: 91 mg/dL (ref 70–99)
Potassium: 3.7 mmol/L (ref 3.5–5.1)
Sodium: 137 mmol/L (ref 135–145)
Total Bilirubin: 3.7 mg/dL — ABNORMAL HIGH (ref 0.3–1.2)
Total Protein: 6.7 g/dL (ref 6.5–8.1)

## 2023-07-04 LAB — MAGNESIUM: Magnesium: 1.9 mg/dL (ref 1.7–2.4)

## 2023-07-04 MED ORDER — CEPHALEXIN 500 MG PO CAPS
500.0000 mg | ORAL_CAPSULE | Freq: Four times a day (QID) | ORAL | Status: DC
  Administered 2023-07-04 – 2023-07-06 (×9): 500 mg via ORAL
  Filled 2023-07-04 (×9): qty 1

## 2023-07-04 MED ORDER — POTASSIUM CHLORIDE CRYS ER 20 MEQ PO TBCR
40.0000 meq | EXTENDED_RELEASE_TABLET | Freq: Once | ORAL | Status: AC
  Administered 2023-07-04: 40 meq via ORAL
  Filled 2023-07-04: qty 2

## 2023-07-04 NOTE — Plan of Care (Signed)
  Problem: Education: Goal: Knowledge of General Education information will improve Description Including pain rating scale, medication(s)/side effects and non-pharmacologic comfort measures Outcome: Progressing   Problem: Activity: Goal: Risk for activity intolerance will decrease Outcome: Progressing   Problem: Safety: Goal: Ability to remain free from injury will improve Outcome: Progressing

## 2023-07-04 NOTE — Plan of Care (Signed)
  Problem: Education: Goal: Knowledge of General Education information will improve Description: Including pain rating scale, medication(s)/side effects and non-pharmacologic comfort measures Outcome: Progressing   Problem: Health Behavior/Discharge Planning: Goal: Ability to manage health-related needs will improve Outcome: Progressing   Problem: Clinical Measurements: Goal: Diagnostic test results will improve Outcome: Progressing   Problem: Safety: Goal: Ability to remain free from injury will improve Outcome: Progressing

## 2023-07-04 NOTE — Plan of Care (Signed)
  Problem: Education: Goal: Knowledge of General Education information will improve Description: Including pain rating scale, medication(s)/side effects and non-pharmacologic comfort measures Outcome: Progressing   Problem: Clinical Measurements: Goal: Respiratory complications will improve Outcome: Progressing Goal: Cardiovascular complication will be avoided Outcome: Progressing   Problem: Nutrition: Goal: Adequate nutrition will be maintained Outcome: Progressing   Problem: Coping: Goal: Level of anxiety will decrease Outcome: Progressing

## 2023-07-04 NOTE — Progress Notes (Signed)
Progress Note   Patient: Carrie Kuilan ZOX:[id number] DOB: 01-04-1946 DOA: 06/21/2023     13 DOS: the patient was seen and examined on 07/04/2023   Brief hospital course: 77 year old with history of lymphedema, HTN, obesity admitted to the hospital with concerns of shortness of breath ongoing for 2-3 weeks. Patient was diagnosed with CHF exacerbation. She had elevated BNP, total bilirubin was elevated. Upon admission started on diuretics. Echocardiogram showed preserved EF with elevated PASP, cor pulmonale and pericardial effusion without tamponade. CTA chest showed acute pulmonary embolism but right side heart strain, case discussed with pulmonary who recommended heparin otherwise no indication for thrombectomy at this time. Initial plan was to transfer patient to [facility] for RHC but given acute pulmonary embolism, will monitor the patient. Overall patient is responding well to IV diuretics but still remains somewhat hypoxic. PT/OT recommended no further follow-up.   Assessment and Plan: Acute respiratory distress with mild hypoxia Acute congestive heart failure, preserved ejection fraction" pulmonology, EF 60% Pericardial effusion without tamponade -Patient certainly has right-sided heart failure and signs of peripheral volume overload.e.  Patient seen by cardiology, eventually will need RHC once euvolemic.  Repeat echo in 2 weeks.  -continuing to diurese well with IV lasix -Wt down to 95kg from 123kg on presentation -Cont to wean O2 as tolerated -recheck bmet in AM   Acute right upper lobe and subsegmental pulmonary embolism without cor pulmonale Moderate pleural effusion, left - Seen by pulmonary, no indication for thrombectomy at this time.  Lower extremity Dopplers negative for DVT.   -Now on eliquis -recheck cbc in AM   Thrombocytopenia - Drifted down to 66K this visit.  Seen by hematology.  No need to hold anticoagulation unless if she develops bleeding or platelets  are below 30. 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  Will need to repeat in next 3-4 weeks   Elevated total bilirubin, improving - Had been trending down, today, up to 3.7 -Hepatic steatosis seen on RUQ [place] on 8/20 -Recheck LFT in AM   Lymphedema -this appears to be chronic   Hypokalemia -Replete as needed   Hyperlipidemia -continue home Crestor    Hypertension -IV prn and lasix   Morbid obesity -BMI 42.9, complicating all aspects of care  Possible BLE cellulitis -Pt complaining of increased pain and burning in BLE with erythema -Afebrile, no leukocytosis -Given hx lymphedema, will empirically start keflex to cover celluliits      Subjective:Complaining of continued BLE burning leg pains. Swelling is improving  Physical Exam: Vitals:   07/03/23 1316 07/03/23 2132 07/04/23 0453 07/04/23 1351  BP: 110/63 124/84 (!) 124/50 (!) 114/45  Pulse: 91 98 89 87  Resp: 18   16  Temp: 98.3 F (36.8 C) 97.9 F (36.6 C) 98 F (36.7 C) 97.9 F (36.6 C)  TempSrc: Oral Oral Oral Oral  SpO2: 93% 93% 92% 93%  Weight:   95.2 kg   Height:       General exam: Conversant, in no acute distress Respiratory system: normal chest rise, clear, no audible wheezing Cardiovascular system: regular rhythm, s1-s2 Gastrointestinal system: Nondistended, nontender, pos BS Central nervous system: No seizures, no tremors Extremities: No cyanosis, no joint deformities, BLE edema improving Skin: No rashes, no pallor Psychiatry: Affect normal // no auditory hallucinations   Data Reviewed:  Labs reviewed: Na 137, K 3.7, Cr 0.81, WBC 6.6, Hgb 14.4,  Plts 92  Family Communication: Pt in room, family at bedside  Disposition: Status is: Inpatient Remains inpatient appropriate because: Severity of illness  Planned Discharge Destination: Home with Home Health     Author: Rickey Barbara, MD 07/04/2023 4:04  PM  For on call review [email or web address].

## 2023-07-05 ENCOUNTER — Inpatient Hospital Stay: Payer: Medicare Other | Admitting: Pulmonary Disease

## 2023-07-05 DIAGNOSIS — R0902 Hypoxemia: Secondary | ICD-10-CM | POA: Diagnosis not present

## 2023-07-05 DIAGNOSIS — J810 Acute pulmonary edema: Secondary | ICD-10-CM | POA: Diagnosis not present

## 2023-07-05 DIAGNOSIS — E8770 Fluid overload, unspecified: Secondary | ICD-10-CM | POA: Diagnosis not present

## 2023-07-05 LAB — BASIC METABOLIC PANEL
Anion gap: 9 (ref 5–15)
BUN: 16 mg/dL (ref 8–23)
CO2: 30 mmol/L (ref 22–32)
Calcium: 10 mg/dL (ref 8.9–10.3)
Chloride: 97 mmol/L — ABNORMAL LOW (ref 98–111)
Creatinine, Ser: 0.7 mg/dL (ref 0.44–1.00)
GFR, Estimated: >60 mL/min (ref >60)
Glucose, Bld: 106 mg/dL — ABNORMAL HIGH (ref 70–99)
Potassium: 3.1 mmol/L — ABNORMAL LOW (ref 3.5–5.1)
Sodium: 136 mmol/L (ref 135–145)

## 2023-07-05 MED ORDER — DOCUSATE SODIUM 100 MG PO CAPS
100.0000 mg | ORAL_CAPSULE | Freq: Two times a day (BID) | ORAL | Status: DC
  Administered 2023-07-05 – 2023-07-06 (×2): 100 mg via ORAL
  Filled 2023-07-05 (×2): qty 1

## 2023-07-05 NOTE — Progress Notes (Signed)
Physical Therapy Treatment Patient Details Name: Carrie Guzman MRN: [id number] DOB: September 16, 1946 Today's Date: 07/05/2023   History of Present Illness 77 year old with history of lymphedema, HTN, obesity admitted to the hospital on 06/21/23 with concerns of shortness of breath ongoing for 2-3 weeks.  Patient was diagnosed with CHF exacerbation and severe pulmonary hypertension.  Also found to have acute right upper lobe and subsegmental pulmonary embolism without cor pulmonale.    PT Comments  Pt ambulated 120' with RW, no loss of balance, SpO2 87% on 1L while walking, 90-91% on 2L O2 walking. She is progressing well with mobility.     If plan is discharge home, recommend the following: A little help with walking and/or transfers;A little help with bathing/dressing/bathroom;Assist for transportation;Help with stairs or ramp for entrance   Can travel by private vehicle        Equipment Recommendations  Rolling walker (2 wheels)    Recommendations for Other Services       Precautions / Restrictions Precautions Precautions: Fall Precaution Comments: monitor sats, B "bad" knees (pt likes to wear her shoes) Restrictions Weight Bearing Restrictions: No     Mobility  Bed Mobility               General bed mobility comments: up walking with OT at start of PT session    Transfers     Transfers: Sit to/from Stand Sit to Stand: Supervision           General transfer comment: VCs hand placement for stand to sit to 3 in 1 over toilet    Ambulation/Gait Ambulation/Gait assistance: Supervision Gait Distance (Feet): 120 Feet Assistive device: Rolling walker (2 wheels) Gait Pattern/deviations: Step-to pattern, Decreased stride length, Trunk flexed Gait velocity: decreased     General Gait Details: VCs to lift head, SpO2 87% on 1L walking, 91% on 2L O2 walking, VCs for pursed lip breathing   Stairs             Wheelchair Mobility     Tilt Bed     Modified Rankin (Stroke Patients Only)       Balance Overall balance assessment: Needs assistance Sitting-balance support: No upper extremity supported Sitting balance-Leahy Scale: Good     Standing balance support: Bilateral upper extremity supported, During functional activity, Reliant on assistive device for balance Standing balance-Leahy Scale: Poor Standing balance comment: required RW                            Cognition Arousal: Alert Behavior During Therapy: WFL for tasks assessed/performed Overall Cognitive Status: Within Functional Limits for tasks assessed                                 General Comments: AxO x 4 pleasant and motivated.        Exercises      General Comments        Pertinent Vitals/Pain Pain Assessment Pain Assessment: No/denies pain    Home Living                          Prior Function            PT Goals (current goals can now be found in the care plan section) Acute Rehab PT Goals PT Goal Formulation: With patient Time For Goal Achievement: 07/06/23 Potential to Achieve Goals: Good  Progress towards PT goals: Progressing toward goals    Frequency    Min 1X/week      PT Plan      Co-evaluation              AM-PAC PT "6 Clicks" Mobility   Outcome Measure  Help needed turning from your back to your side while in a flat bed without using bedrails?: A Little Help needed moving from lying on your back to sitting on the side of a flat bed without using bedrails?: A Little Help needed moving to and from a bed to a chair (including a wheelchair)?: A Little Help needed standing up from a chair using your arms (e.g., wheelchair or bedside chair)?: A Little Help needed to walk in hospital room?: A Little Help needed climbing 3-5 steps with a railing? : A Little 6 Click Score: 18    End of Session Equipment Utilized During Treatment: Oxygen Activity Tolerance: Patient tolerated  treatment well Patient left: with nursing/sitter in room;with call bell/phone within reach;Other (comment) (on toilet) Nurse Communication: Mobility status PT Visit Diagnosis: Difficulty in walking, not elsewhere classified (R26.2)     Time: 1610-9604 PT Time Calculation (min) (ACUTE ONLY): 8 min  Charges:    $Gait Training: 8-22 mins PT General Charges $$ ACUTE PT VISIT: 1 Visit                     Tamala Ser PT 07/05/2023  Acute Rehabilitation Services  Office [phone]

## 2023-07-05 NOTE — Progress Notes (Signed)
OT Cancellation Note  Patient Details Name: Carrie Guzman MRN: [id number] DOB: Dec 07, 1945   Cancelled Treatment:    Reason Eval/Treat Not Completed: Other (comment). Pt stated she just returned to bed and would prefer for therapy to check back in the p.m.   Reuben Likes 07/05/2023, 10:04 AM

## 2023-07-05 NOTE — Progress Notes (Signed)
Occupational Therapy Treatment Patient Details Name: Carrie Guzman MRN: [id number] DOB: [birth year] Today's Date: 07/05/2023   History of present illness 77 year old with history of lymphedema, HTN, obesity admitted to the hospital on 06/21/23 with concerns of shortness of breath ongoing for 2-3 weeks.  Patient was diagnosed with CHF exacerbation and severe pulmonary hypertension.  Also found to have acute right upper lobe and subsegmental pulmonary embolism without cor pulmonale.   OT comments  The pt was seen for progression of functional activity, functional endurance training, and general functional strengthening, as such is needed to facilitate progressive ADL performance. She required supervision for supine to sit, for donning a hospital gown around her back seated EOB, and for donning her shoes seated EOB. She subsequently needed SBA for sit to stand, as well as for ambulation into the hall using a RW. She required cues for pursed lip breathing, due to shortness of breath with activity. Her O2 saturation was noted to be 87% on 1L with activity, however improved to 90-91% on 2L O2. Continue OT plan of care to maximize her ADL performance & to facilitate her safe return home.       If plan is discharge home, recommend the following:  Assistance with cooking/housework;A little help with bathing/dressing/bathroom;A little help with walking and/or transfers;Assist for transportation   Equipment Recommendations  None recommended by OT    Recommendations for Other Services      Precautions / Restrictions Precautions Precautions: Fall Precaution Comments: monitor sats, B "bad" knees (pt likes to wear her shoes) Restrictions Weight Bearing Restrictions: No       Mobility Bed Mobility Overal bed mobility: Needs Assistance Bed Mobility: Supine to Sit     Supine to sit: Supervision, HOB elevated, Used rails          Transfers Overall transfer level: Needs assistance Equipment  used: Rolling walker (2 wheels) Transfers: Sit to/from Stand Sit to Stand: Supervision                     ADL either performed or assessed with clinical judgement   ADL Overall ADL's : Needs assistance/impaired Eating/Feeding: Independent;Sitting Eating/Feeding Details (indicate cue type and reason): based on clinical judgement Grooming: Set up;Sitting Grooming Details (indicate cue type and reason): at chair level, based on clinical judgement         Upper Body Dressing : Set up;Sitting Upper Body Dressing Details (indicate cue type and reason): She donned a hospital gown around her back seated EOB. Lower Body Dressing: Set up;Supervision/safety     Toilet Transfer Details (indicate cue type and reason): She donned her slide in shoes seated EOB.                  Cognition Arousal: Alert Behavior During Therapy: WFL for tasks assessed/performed Overall Cognitive Status: Within Functional Limits for tasks assessed                        Pertinent Vitals/ Pain       Pain Assessment Pain Assessment: No/denies pain         Frequency  Min 1X/week        Progress Toward Goals  OT Goals(current goals can now be found in the care plan section)  Progress towards OT goals: Progressing toward goals  Acute Rehab OT Goals OT Goal Formulation: With patient Time For Goal Achievement: 07/11/23 Potential to Achieve Goals: Good  Plan  AM-PAC OT "6 Clicks" Daily Activity     Outcome Measure   Help from another person eating meals?: None Help from another person taking care of personal grooming?: A Little Help from another person toileting, which includes using toliet, bedpan, or urinal?: A Little Help from another person bathing (including washing, rinsing, drying)?: A Little Help from another person to put on and taking off regular upper body clothing?: A Little Help from another person to put on and taking off regular lower body clothing?:  A Little 6 Click Score: 19    End of Session Equipment Utilized During Treatment: Rolling walker (2 wheels);Oxygen  OT Visit Diagnosis: Muscle weakness (generalized) (M62.81);Unsteadiness on feet (R26.81)   Activity Tolerance Other (comment) (Fair+ overall tolerance)   Patient Left Other (comment) (Pt left with physical therapist standing in hall to complete ambulation)   Nurse Communication Other (comment)        Time: 8119-1478 OT Time Calculation (min): 18 min  Charges: OT General Charges $OT Visit: 1 Visit OT Treatments $Therapeutic Activity: 8-22 mins     Reuben Likes, OTR/L 07/05/2023, 2:52 PM

## 2023-07-05 NOTE — TOC Progression Note (Signed)
Transition of Care [facility]) - Progression Note    Patient Details  Name: Carrie Guzman MRN: [id number] Date of Birth: 08-21-1946  Transition of [facility]) CM/SW Contact  Beckie Busing, RN Phone Number:[phone]  07/05/2023, 11:22 AM  Clinical Narrative:    TOC continues to follow. Per notes staff is continuing to attempt O2 weaning.    Expected Discharge Plan: Home w Home Health Services Barriers to Discharge: Continued Medical Work up  Expected Discharge Plan and Services   Discharge Planning Services: CM Consult Post Acute Care Choice: Home Health Living arrangements for the past 2 months: Single Family Home                 DME Arranged: Walker rolling DME Agency: [employer] Date DME Agency Contacted: 06/23/23 Time DME Agency Contacted: 1504 Representative spoke with at DME Agency: Vaughan Basta HH Arranged: OT HH Agency: [facility] Date [facility] Agency Contacted: 06/23/23 Time HH Agency Contacted: 1504 Representative spoke with at [facility] Agency: Elnita Maxwell   Social Determinants of Health (SDOH) Interventions SDOH Screenings   Food Insecurity: No Food Insecurity (06/21/2023)  Housing: Low Risk  (06/21/2023)  Transportation Needs: No Transportation Needs (06/21/2023)  Utilities: Not At Risk (06/21/2023)  Tobacco Use: Low Risk  (06/21/2023)  Recent Concern: Tobacco Use - Medium Risk (06/07/2023)   Received from [facility]    Readmission Risk Interventions     No data to display

## 2023-07-05 NOTE — Progress Notes (Signed)
Progress Note   Patient: Carrie Guzman ION:[id number] DOB: [birth year] DOA: 06/21/2023     14 DOS: the patient was seen and examined on 07/05/2023   Brief hospital course: 77 year old with history of lymphedema, HTN, obesity admitted to the hospital with concerns of shortness of breath ongoing for 2-3 weeks. Patient was diagnosed with CHF exacerbation. She had elevated BNP, total bilirubin was elevated. Upon admission started on diuretics. Echocardiogram showed preserved EF with elevated PASP, cor pulmonale and pericardial effusion without tamponade. CTA chest showed acute pulmonary embolism but right side heart strain, case discussed with pulmonary who recommended heparin otherwise no indication for thrombectomy at this time. Initial plan was to transfer patient to [facility] for RHC but given acute pulmonary embolism, will monitor the patient. Overall patient is responding well to IV diuretics but still remains somewhat hypoxic. PT/OT recommended no further follow-up.   Assessment and Plan: Acute respiratory distress with mild hypoxia Acute congestive heart failure, preserved ejection fraction" pulmonology, EF 60% Pericardial effusion without tamponade -Patient certainly has right-sided heart failure and signs of peripheral volume overload.e.  Patient seen by cardiology, eventually will need RHC once euvolemic.  Repeat echo in 2 weeks.  -continuing to diurese well with IV lasix -Wt stable at 96.3kg from 123kg on presentation -Currently on 1LNC, cont to wean. Pt is O2 naive -recheck bmet in AM   Acute right upper lobe and subsegmental pulmonary embolism without cor pulmonale Moderate pleural effusion, left - Seen by pulmonary, no indication for thrombectomy at this time.  Lower extremity Dopplers negative for DVT.   -Now on eliquis -recheck cbc in AM   Thrombocytopenia - Drifted down to 66K this visit.  Seen by hematology.  No need to hold anticoagulation unless if she develops  bleeding or platelets are below 30. 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  Will need to repeat in next 3-4 weeks   Elevated total bilirubin, improving - Had been trending down -Hepatic steatosis seen on RUQ [place] on 8/20 -Repeat LFT in AM   Lymphedema -this appears to be chronic   Hypokalemia -Replete as needed   Hyperlipidemia -continue home Crestor    Hypertension -IV prn and lasix   Morbid obesity -BMI 42.9, complicating all aspects of care  Possible BLE cellulitis -Pt complaining of increased pain and burning in BLE with erythema -Afebrile, no leukocytosis -Given hx lymphedema, empirically started keflex to cover celluliits -Erythema and pain has improved      Subjective:states LE discomfort has improved  Physical Exam: Vitals:   07/04/23 1351 07/04/23 2118 07/05/23 0524 07/05/23 0700  BP: (!) 114/45 (!) 119/57 117/63   Pulse: 87 95 85   Resp: 16 20 18    Temp: 97.9 F (36.6 C) 98.6 F (37 C) 98 F (36.7 C)   TempSrc: Oral Oral Oral   SpO2: 93% 93% 97%   Weight:    96.3 kg  Height:       General exam: Awake, laying in bed, in nad Respiratory system: Normal respiratory effort, no wheezing Cardiovascular system: regular rate, s1, s2 Gastrointestinal system: Soft, nondistended, positive BS Central nervous system: CN2-12 grossly intact, strength intact Extremities: Perfused, no clubbing Skin: Normal skin turgor, no notable skin lesions seen Psychiatry: Mood normal // no visual hallucinations   Data Reviewed:  There are no new results to review at this time.  Family Communication: Pt in room,  family at bedside  Disposition: Status is: Inpatient Remains inpatient appropriate because: Severity of illness  Planned Discharge Destination: Home with Home Health     Author: Rickey Barbara, MD 07/05/2023 11:43 AM  For on call review [email or web address].

## 2023-07-05 NOTE — Progress Notes (Signed)
Pt had SVT 29 beats nonsustained, asymptomatic vitals within range see chart. Chinita Greenland, NP notified.

## 2023-07-05 NOTE — Progress Notes (Signed)
Nutrition Education Note  RD consulted for nutrition education regarding new onset CHF.  RD provided "Low Sodium Nutrition Therapy" handout from the Academy of [employer]. Reviewed patient's dietary recall. Provided examples on ways to decrease sodium intake in diet. Discouraged intake of processed foods and use of salt shaker. Encouraged fresh fruits and vegetables as well as whole grain sources of carbohydrates to maximize fiber intake.   RD discussed why it is important for patient to adhere to diet recommendations, and emphasized the role of fluids, foods to avoid, and importance of weighing self daily. Teach back method used.  Expect good compliance. Pt and husband have been researching and had good questions to ask RD. Felt better after discussion.  Body mass index is 36.44 kg/m. Pt meets criteria for obesity based on current BMI.  Current diet order is heart healthy, patient is consuming approximately 75% of meals at this time. Labs and medications reviewed. No further nutrition interventions warranted at this time.  If additional nutrition issues arise, please re-consult RD.   Carrie Franco, MS, RD, LDN Inpatient Clinical Dietitian Contact information available via [employer]

## 2023-07-06 ENCOUNTER — Ambulatory Visit: Payer: Medicare Other | Admitting: Hematology

## 2023-07-06 DIAGNOSIS — I509 Heart failure, unspecified: Secondary | ICD-10-CM | POA: Diagnosis not present

## 2023-07-06 LAB — CBC
HCT: 45.9 % (ref 36.0–46.0)
Hemoglobin: 14.8 g/dL (ref 12.0–15.0)
MCH: 32.2 pg (ref 26.0–34.0)
MCHC: 32.2 g/dL (ref 30.0–36.0)
MCV: 100 fL (ref 80.0–100.0)
Platelets: 138 10*3/uL — ABNORMAL LOW (ref 150–400)
RBC: 4.59 MIL/uL (ref 3.87–5.11)
RDW: 17.1 % — ABNORMAL HIGH (ref 11.5–15.5)
WBC: 5.9 10*3/uL (ref 4.0–10.5)
nRBC: 0 % (ref 0.0–0.2)

## 2023-07-06 LAB — COMPREHENSIVE METABOLIC PANEL
ALT: 18 U/L (ref 0–44)
AST: 23 U/L (ref 15–41)
Albumin: 2.8 g/dL — ABNORMAL LOW (ref 3.5–5.0)
Alkaline Phosphatase: 106 U/L (ref 38–126)
Anion gap: 9 (ref 5–15)
BUN: 17 mg/dL (ref 8–23)
CO2: 30 mmol/L (ref 22–32)
Calcium: 10 mg/dL (ref 8.9–10.3)
Chloride: 96 mmol/L — ABNORMAL LOW (ref 98–111)
Creatinine, Ser: 0.69 mg/dL (ref 0.44–1.00)
GFR, Estimated: >60 mL/min (ref >60)
Glucose, Bld: 92 mg/dL (ref 70–99)
Potassium: 3 mmol/L — ABNORMAL LOW (ref 3.5–5.1)
Sodium: 135 mmol/L (ref 135–145)
Total Bilirubin: 2.9 mg/dL — ABNORMAL HIGH (ref 0.3–1.2)
Total Protein: 6.6 g/dL (ref 6.5–8.1)

## 2023-07-06 MED ORDER — POTASSIUM CHLORIDE CRYS ER 20 MEQ PO TBCR
40.0000 meq | EXTENDED_RELEASE_TABLET | Freq: Two times a day (BID) | ORAL | Status: DC
  Administered 2023-07-06: 40 meq via ORAL
  Filled 2023-07-06: qty 2

## 2023-07-06 MED ORDER — CEPHALEXIN 500 MG PO CAPS: 500.0000 mg | ORAL_CAPSULE | Freq: Four times a day (QID) | ORAL | 0 refills | Status: AC

## 2023-07-06 MED ORDER — APIXABAN 5 MG PO TABS: 5.0000 mg | ORAL_TABLET | Freq: Two times a day (BID) | ORAL | 2 refills | Status: DC

## 2023-07-06 NOTE — Discharge Summary (Signed)
Physician Discharge Summary  Carrie Guzman ZOX:[id number] DOB: [birth year] DOA: 06/21/2023  PCP: Melida Quitter, MD  Admit date: 06/21/2023  Discharge date: 07/06/2023  Admitted From:Home  Disposition:  Home  Recommendations for Outpatient Follow-up:  Follow up with PCP in 1-2 weeks Follow-up CBC in 1 week to ensure platelet counts are stable Continue on Keflex as prescribed for 5 more days to complete course of treatment for cellulitis Continue Eliquis twice daily as prescribed for acute PE Repeat TSH and T4 in 3-4 weeks  Home Health: Yes with PT/OT  Equipment/Devices: 2 L nasal cannula  Discharge Condition:Stable  CODE STATUS: Full  Diet recommendation: Heart Healthy  Brief/Interim Summary: 77 year old with history of lymphedema, HTN, obesity admitted to the hospital with concerns of shortness of breath ongoing for 2-3 weeks. Patient was diagnosed with CHF exacerbation. She had elevated BNP, total bilirubin was elevated. Upon admission started on diuretics. Echocardiogram showed preserved EF with elevated PASP, cor pulmonale and pericardial effusion without tamponade. CTA chest showed acute pulmonary embolism but right side heart strain, case discussed with pulmonary who recommended heparin otherwise no indication for thrombectomy at this time. Initial plan was to transfer patient to [facility] for RHC but given acute pulmonary embolism, will monitor the patient. Overall patient is responding well to IV diuretics but still remains somewhat hypoxic. PT/OT recommended home health services.  She now appears to have diuresed adequately, but is still requiring 2 L nasal cannula oxygen which will be arranged at home.  Additionally, she was noted to develop some bilateral lower extremity cellulitis and has been started on Keflex with improvement noted.  She should remain on medications as noted above and is now in stable condition for discharge.  Discharge Diagnoses:  Principal  Problem:   Acute CHF (congestive heart failure) (HCC) Active Problems:   Acute systolic (congestive) heart failure (HCC)   Acute pulmonary embolism with acute cor pulmonale (HCC)   Thrombocytopenia (HCC)   Acute diastolic HF (heart failure) (HCC)  Principal discharge diagnosis: Acute hypoxemic respiratory failure-multifactorial secondary to HFpEF along with acute right upper lobe PE.  Discharge Instructions  Discharge Instructions     Diet - low sodium heart healthy   Complete by: As directed    Increase activity slowly   Complete by: As directed    No wound care   Complete by: As directed       Allergies as of 07/06/2023   No Known Allergies      Medication List     TAKE these medications    acetaminophen 650 MG CR tablet Commonly known as: TYLENOL Take 1,300 mg by mouth as needed for pain.   apixaban 5 MG Tabs tablet Commonly known as: ELIQUIS Take 1 tablet (5 mg total) by mouth 2 (two) times daily.   atenolol 50 MG tablet Commonly known as: TENORMIN Take by mouth.   BIOFREEZE EX Apply 1 Application topically daily.   Centrum Silver 50+Women Tabs Take 1 tablet by mouth daily.   cephALEXin 500 MG capsule Commonly known as: KEFLEX Take 1 capsule (500 mg total) by mouth every 6 (six) hours for 5 days.   FISH OIL BURP-LESS PO Take 1 capsule by mouth daily.   furosemide 40 MG tablet Commonly known as: LASIX Take 1 tablet by mouth daily.   losartan 50 MG tablet Commonly known as: COZAAR Take by mouth.   potassium chloride 10 MEQ tablet Commonly known as: KLOR-CON Take by mouth.   QC TUMERIC COMPLEX PO  Physician Discharge Summary  Carrie Guzman ZOX:[id number] DOB: [birth year] DOA: 06/21/2023  PCP: Melida Quitter, MD  Admit date: 06/21/2023  Discharge date: 07/06/2023  Admitted From:Home  Disposition:  Home  Recommendations for Outpatient Follow-up:  Follow up with PCP in 1-2 weeks Follow-up CBC in 1 week to ensure platelet counts are stable Continue on Keflex as prescribed for 5 more days to complete course of treatment for cellulitis Continue Eliquis twice daily as prescribed for acute PE Repeat TSH and T4 in 3-4 weeks  Home Health: Yes with PT/OT  Equipment/Devices: 2 L nasal cannula  Discharge Condition:Stable  CODE STATUS: Full  Diet recommendation: Heart Healthy  Brief/Interim Summary: 77 year old with history of lymphedema, HTN, obesity admitted to the hospital with concerns of shortness of breath ongoing for 2-3 weeks. Patient was diagnosed with CHF exacerbation. She had elevated BNP, total bilirubin was elevated. Upon admission started on diuretics. Echocardiogram showed preserved EF with elevated PASP, cor pulmonale and pericardial effusion without tamponade. CTA chest showed acute pulmonary embolism but right side heart strain, case discussed with pulmonary who recommended heparin otherwise no indication for thrombectomy at this time. Initial plan was to transfer patient to [facility] for RHC but given acute pulmonary embolism, will monitor the patient. Overall patient is responding well to IV diuretics but still remains somewhat hypoxic. PT/OT recommended home health services.  She now appears to have diuresed adequately, but is still requiring 2 L nasal cannula oxygen which will be arranged at home.  Additionally, she was noted to develop some bilateral lower extremity cellulitis and has been started on Keflex with improvement noted.  She should remain on medications as noted above and is now in stable condition for discharge.  Discharge Diagnoses:  Principal  Problem:   Acute CHF (congestive heart failure) (HCC) Active Problems:   Acute systolic (congestive) heart failure (HCC)   Acute pulmonary embolism with acute cor pulmonale (HCC)   Thrombocytopenia (HCC)   Acute diastolic HF (heart failure) (HCC)  Principal discharge diagnosis: Acute hypoxemic respiratory failure-multifactorial secondary to HFpEF along with acute right upper lobe PE.  Discharge Instructions  Discharge Instructions     Diet - low sodium heart healthy   Complete by: As directed    Increase activity slowly   Complete by: As directed    No wound care   Complete by: As directed       Allergies as of 07/06/2023   No Known Allergies      Medication List     TAKE these medications    acetaminophen 650 MG CR tablet Commonly known as: TYLENOL Take 1,300 mg by mouth as needed for pain.   apixaban 5 MG Tabs tablet Commonly known as: ELIQUIS Take 1 tablet (5 mg total) by mouth 2 (two) times daily.   atenolol 50 MG tablet Commonly known as: TENORMIN Take by mouth.   BIOFREEZE EX Apply 1 Application topically daily.   Centrum Silver 50+Women Tabs Take 1 tablet by mouth daily.   cephALEXin 500 MG capsule Commonly known as: KEFLEX Take 1 capsule (500 mg total) by mouth every 6 (six) hours for 5 days.   FISH OIL BURP-LESS PO Take 1 capsule by mouth daily.   furosemide 40 MG tablet Commonly known as: LASIX Take 1 tablet by mouth daily.   losartan 50 MG tablet Commonly known as: COZAAR Take by mouth.   potassium chloride 10 MEQ tablet Commonly known as: KLOR-CON Take by mouth.   QC TUMERIC COMPLEX PO  Take 1 tablet by mouth daily.   rosuvastatin 10 MG tablet Commonly known as: CRESTOR Take by mouth.               Durable Medical Equipment  (From admission, onward)           Start     Ordered   07/06/23 1008  For home use only DME oxygen  Once       Question Answer Comment  Length of Need Lifetime   Mode or (Route) Nasal  cannula   Liters per Minute 2   Frequency Continuous (stationary and portable oxygen unit needed)   Oxygen conserving device Yes   Oxygen delivery system Gas      07/06/23 1007            Follow-up Information     [facility] Follow up.   Why: [facility] rep Cheryl [phone]  HH[facility]/OT Contact information: [address] [place] [address] [phone]         [facility] Follow up.   Why: rolling walker rep [phone]        Nadene Rubins Nyoka Cowden, MD. Schedule an appointment as soon as possible for a visit in 1 week(s).   Specialty: Internal Medicine Contact information: [address] [place] [address] [phone]                No Known Allergies  Consultations: Cardiology Hematology Critical care   Procedures/Studies: DG CHEST PORT 1 VIEW  Result Date: 06/28/2023 CLINICAL DATA:  Acute hypoxemic respiratory failure (HCC) EXAM: PORTABLE CHEST 1 VIEW COMPARISON:  06/21/2023. FINDINGS: Cardiomegaly and pulmonary vascular congestion. Edema. Right basilar opacities. No substantial change. No visible pneumothorax. IMPRESSION: Similar cardiomegaly, pulmonary vascular congestion, small right pleural effusion right basilar opacities. Electronically Signed   By: Feliberto Harts M.D.   On: 06/28/2023 12:59   US Abdomen Limited RUQ (LIVER/GB)  Result Date: 06/27/2023 CLINICAL DATA:  Elevated total bilirubin EXAM: ULTRASOUND ABDOMEN LIMITED RIGHT UPPER QUADRANT COMPARISON:  None Available. FINDINGS: Gallbladder: Surgically absent. Common bile duct: Diameter: 1.6 mm Liver: No focal lesion identified. Increased in parenchymal echogenicity. Portal vein is patent on color Doppler imaging with normal direction of blood flow towards the liver. Other: Trace free fluid in the right upper quadrant. IMPRESSION: 1. Hepatic steatosis. 2. Trace free fluid in the right upper quadrant. Electronically Signed   By: Darliss Cheney M.D.   On: 06/27/2023 20:00   VAS [place] LOWER  EXTREMITY VENOUS (DVT)  Result Date: 06/23/2023  Lower Venous DVT Study Patient Name:  Carrie Guzman  Date of Exam:   06/23/2023 Medical Rec #: [id number]            Accession #:    [id number] Date of Birth: Nov 29, 1945            Patient Gender: F Patient Age:   75 years Exam Location:  [facility] Procedure:      VAS [place] LOWER EXTREMITY VENOUS (DVT) Referring Phys: Stephania Fragmin --------------------------------------------------------------------------------  Indications: Pulmonary embolism, and chronic leg swelling.  Limitations: Body habitus and poor ultrasound/tissue interface. Comparison Study: No previous study. Performing Technologist: McKayla Maag RVT, [place]  Examination Guidelines: A complete evaluation includes B-mode imaging, spectral Doppler, color Doppler, and power Doppler as needed of all accessible portions of each vessel. Bilateral testing is considered an integral part of a complete examination. Limited examinations for reoccurring indications may be performed as noted. The reflux portion of the exam is performed  Physician Discharge Summary  Carrie Guzman ZOX:[id number] DOB: [birth year] DOA: 06/21/2023  PCP: Melida Quitter, MD  Admit date: 06/21/2023  Discharge date: 07/06/2023  Admitted From:Home  Disposition:  Home  Recommendations for Outpatient Follow-up:  Follow up with PCP in 1-2 weeks Follow-up CBC in 1 week to ensure platelet counts are stable Continue on Keflex as prescribed for 5 more days to complete course of treatment for cellulitis Continue Eliquis twice daily as prescribed for acute PE Repeat TSH and T4 in 3-4 weeks  Home Health: Yes with PT/OT  Equipment/Devices: 2 L nasal cannula  Discharge Condition:Stable  CODE STATUS: Full  Diet recommendation: Heart Healthy  Brief/Interim Summary: 77 year old with history of lymphedema, HTN, obesity admitted to the hospital with concerns of shortness of breath ongoing for 2-3 weeks. Patient was diagnosed with CHF exacerbation. She had elevated BNP, total bilirubin was elevated. Upon admission started on diuretics. Echocardiogram showed preserved EF with elevated PASP, cor pulmonale and pericardial effusion without tamponade. CTA chest showed acute pulmonary embolism but right side heart strain, case discussed with pulmonary who recommended heparin otherwise no indication for thrombectomy at this time. Initial plan was to transfer patient to [facility] for RHC but given acute pulmonary embolism, will monitor the patient. Overall patient is responding well to IV diuretics but still remains somewhat hypoxic. PT/OT recommended home health services.  She now appears to have diuresed adequately, but is still requiring 2 L nasal cannula oxygen which will be arranged at home.  Additionally, she was noted to develop some bilateral lower extremity cellulitis and has been started on Keflex with improvement noted.  She should remain on medications as noted above and is now in stable condition for discharge.  Discharge Diagnoses:  Principal  Problem:   Acute CHF (congestive heart failure) (HCC) Active Problems:   Acute systolic (congestive) heart failure (HCC)   Acute pulmonary embolism with acute cor pulmonale (HCC)   Thrombocytopenia (HCC)   Acute diastolic HF (heart failure) (HCC)  Principal discharge diagnosis: Acute hypoxemic respiratory failure-multifactorial secondary to HFpEF along with acute right upper lobe PE.  Discharge Instructions  Discharge Instructions     Diet - low sodium heart healthy   Complete by: As directed    Increase activity slowly   Complete by: As directed    No wound care   Complete by: As directed       Allergies as of 07/06/2023   No Known Allergies      Medication List     TAKE these medications    acetaminophen 650 MG CR tablet Commonly known as: TYLENOL Take 1,300 mg by mouth as needed for pain.   apixaban 5 MG Tabs tablet Commonly known as: ELIQUIS Take 1 tablet (5 mg total) by mouth 2 (two) times daily.   atenolol 50 MG tablet Commonly known as: TENORMIN Take by mouth.   BIOFREEZE EX Apply 1 Application topically daily.   Centrum Silver 50+Women Tabs Take 1 tablet by mouth daily.   cephALEXin 500 MG capsule Commonly known as: KEFLEX Take 1 capsule (500 mg total) by mouth every 6 (six) hours for 5 days.   FISH OIL BURP-LESS PO Take 1 capsule by mouth daily.   furosemide 40 MG tablet Commonly known as: LASIX Take 1 tablet by mouth daily.   losartan 50 MG tablet Commonly known as: COZAAR Take by mouth.   potassium chloride 10 MEQ tablet Commonly known as: KLOR-CON Take by mouth.   QC TUMERIC COMPLEX PO  Physician Discharge Summary  Carrie Guzman ZOX:[id number] DOB: [birth year] DOA: 06/21/2023  PCP: Melida Quitter, MD  Admit date: 06/21/2023  Discharge date: 07/06/2023  Admitted From:Home  Disposition:  Home  Recommendations for Outpatient Follow-up:  Follow up with PCP in 1-2 weeks Follow-up CBC in 1 week to ensure platelet counts are stable Continue on Keflex as prescribed for 5 more days to complete course of treatment for cellulitis Continue Eliquis twice daily as prescribed for acute PE Repeat TSH and T4 in 3-4 weeks  Home Health: Yes with PT/OT  Equipment/Devices: 2 L nasal cannula  Discharge Condition:Stable  CODE STATUS: Full  Diet recommendation: Heart Healthy  Brief/Interim Summary: 77 year old with history of lymphedema, HTN, obesity admitted to the hospital with concerns of shortness of breath ongoing for 2-3 weeks. Patient was diagnosed with CHF exacerbation. She had elevated BNP, total bilirubin was elevated. Upon admission started on diuretics. Echocardiogram showed preserved EF with elevated PASP, cor pulmonale and pericardial effusion without tamponade. CTA chest showed acute pulmonary embolism but right side heart strain, case discussed with pulmonary who recommended heparin otherwise no indication for thrombectomy at this time. Initial plan was to transfer patient to [facility] for RHC but given acute pulmonary embolism, will monitor the patient. Overall patient is responding well to IV diuretics but still remains somewhat hypoxic. PT/OT recommended home health services.  She now appears to have diuresed adequately, but is still requiring 2 L nasal cannula oxygen which will be arranged at home.  Additionally, she was noted to develop some bilateral lower extremity cellulitis and has been started on Keflex with improvement noted.  She should remain on medications as noted above and is now in stable condition for discharge.  Discharge Diagnoses:  Principal  Problem:   Acute CHF (congestive heart failure) (HCC) Active Problems:   Acute systolic (congestive) heart failure (HCC)   Acute pulmonary embolism with acute cor pulmonale (HCC)   Thrombocytopenia (HCC)   Acute diastolic HF (heart failure) (HCC)  Principal discharge diagnosis: Acute hypoxemic respiratory failure-multifactorial secondary to HFpEF along with acute right upper lobe PE.  Discharge Instructions  Discharge Instructions     Diet - low sodium heart healthy   Complete by: As directed    Increase activity slowly   Complete by: As directed    No wound care   Complete by: As directed       Allergies as of 07/06/2023   No Known Allergies      Medication List     TAKE these medications    acetaminophen 650 MG CR tablet Commonly known as: TYLENOL Take 1,300 mg by mouth as needed for pain.   apixaban 5 MG Tabs tablet Commonly known as: ELIQUIS Take 1 tablet (5 mg total) by mouth 2 (two) times daily.   atenolol 50 MG tablet Commonly known as: TENORMIN Take by mouth.   BIOFREEZE EX Apply 1 Application topically daily.   Centrum Silver 50+Women Tabs Take 1 tablet by mouth daily.   cephALEXin 500 MG capsule Commonly known as: KEFLEX Take 1 capsule (500 mg total) by mouth every 6 (six) hours for 5 days.   FISH OIL BURP-LESS PO Take 1 capsule by mouth daily.   furosemide 40 MG tablet Commonly known as: LASIX Take 1 tablet by mouth daily.   losartan 50 MG tablet Commonly known as: COZAAR Take by mouth.   potassium chloride 10 MEQ tablet Commonly known as: KLOR-CON Take by mouth.   QC TUMERIC COMPLEX PO  Take 1 tablet by mouth daily.   rosuvastatin 10 MG tablet Commonly known as: CRESTOR Take by mouth.               Durable Medical Equipment  (From admission, onward)           Start     Ordered   07/06/23 1008  For home use only DME oxygen  Once       Question Answer Comment  Length of Need Lifetime   Mode or (Route) Nasal  cannula   Liters per Minute 2   Frequency Continuous (stationary and portable oxygen unit needed)   Oxygen conserving device Yes   Oxygen delivery system Gas      07/06/23 1007            Follow-up Information     [facility] Follow up.   Why: [facility] rep Cheryl [phone]  [facility]/OT Contact information: [address] [place] [address] [phone]         [facility] Follow up.   Why: rolling walker rep [phone]        Nadene Rubins Nyoka Cowden, MD. Schedule an appointment as soon as possible for a visit in 1 week(s).   Specialty: Internal Medicine Contact information: [address] [place] [address] [phone]                No Known Allergies  Consultations: Cardiology Hematology Critical care   Procedures/Studies: DG CHEST PORT 1 VIEW  Result Date: 06/28/2023 CLINICAL DATA:  Acute hypoxemic respiratory failure (HCC) EXAM: PORTABLE CHEST 1 VIEW COMPARISON:  06/21/2023. FINDINGS: Cardiomegaly and pulmonary vascular congestion. Edema. Right basilar opacities. No substantial change. No visible pneumothorax. IMPRESSION: Similar cardiomegaly, pulmonary vascular congestion, small right pleural effusion right basilar opacities. Electronically Signed   By: Feliberto Harts M.D.   On: 06/28/2023 12:59   US Abdomen Limited RUQ (LIVER/GB)  Result Date: 06/27/2023 CLINICAL DATA:  Elevated total bilirubin EXAM: ULTRASOUND ABDOMEN LIMITED RIGHT UPPER QUADRANT COMPARISON:  None Available. FINDINGS: Gallbladder: Surgically absent. Common bile duct: Diameter: 1.6 mm Liver: No focal lesion identified. Increased in parenchymal echogenicity. Portal vein is patent on color Doppler imaging with normal direction of blood flow towards the liver. Other: Trace free fluid in the right upper quadrant. IMPRESSION: 1. Hepatic steatosis. 2. Trace free fluid in the right upper quadrant. Electronically Signed   By: Darliss Cheney M.D.   On: 06/27/2023 20:00   VAS [place] LOWER  EXTREMITY VENOUS (DVT)  Result Date: 06/23/2023  Lower Venous DVT Study Patient Name:  Carrie Guzman  Date of Exam:   06/23/2023 Medical Rec #: [id number]            Accession #:    [id number] Date of Birth: Nov 29, 1945            Patient Gender: F Patient Age:   75 years Exam Location:  [facility] Procedure:      VAS [place] LOWER EXTREMITY VENOUS (DVT) Referring Phys: Stephania Fragmin --------------------------------------------------------------------------------  Indications: Pulmonary embolism, and chronic leg swelling.  Limitations: Body habitus and poor ultrasound/tissue interface. Comparison Study: No previous study. Performing Technologist: McKayla Maag RVT, [place]  Examination Guidelines: A complete evaluation includes B-mode imaging, spectral Doppler, color Doppler, and power Doppler as needed of all accessible portions of each vessel. Bilateral testing is considered an integral part of a complete examination. Limited examinations for reoccurring indications may be performed as noted. The reflux portion of the exam is performed  the thoracic spine. No acute fracture. Review of the MIP images confirms the above findings. IMPRESSION: 1. Right upper lobe segmental and subsegmental pulmonary emboli. There is evidence of right heart strain. 2. Cardiomegaly with large pericardial effusion. 3. Moderate right pleural effusion. 4. Patchy airspace disease bilaterally with consolidation in the right lower lobe, possible atelectasis or infiltrate. 5. Aortic atherosclerosis and coronary artery calcifications. 6. Complex hyperdense lesion in the mid left kidney. Nonemergent ultrasound is recommended for further characterization. 7. Partial visualization of herniated small bowel in the right upper quadrant. Electronically Signed: By: Thornell Sartorius M.D. On: 06/22/2023 20:14   ECHOCARDIOGRAM COMPLETE  Result Date: 06/22/2023    ECHOCARDIOGRAM REPORT   Patient Name:   Carrie Guzman Date of Exam: 06/22/2023 Medical Rec #:  [id number]           Height:       64.0 in Accession #:    [id number]          Weight:       272.9 lb Date of Birth:  01/20/1946           BSA:          2.233 m Patient Age:    76 years            BP:           100/64 mmHg Patient Gender: F                   HR:           58 bpm. Exam Location:  Inpatient Procedure: 2D Echo, 3D Echo, Cardiac Doppler and Color Doppler Indications:    I50.40* Unspecified combined systolic (congestive) and diastolic                 (congestive) heart failure  History:        Patient has no prior history of Echocardiogram examinations.                 CHF, Signs/Symptoms:Edema, Shortness of Breath and Dyspnea; Risk                 Factors:Hypertension.  Sonographer:    Sheralyn Boatman RDCS Referring Phys: [id number] MIR [facility]  Sonographer Comments: Patient is obese and suboptimal subcostal window. Image acquisition challenging due to patient body habitus. IMPRESSIONS  1. Left ventricular ejection fraction, by estimation, is 60 to 65%. The left ventricle has normal function. The left ventricle has no  regional wall motion abnormalities. Left ventricular diastolic parameters are consistent with Grade I diastolic dysfunction (impaired relaxation). There is abnormal (paradoxical) septal motion, consistent with right ventricular volume overload and the interventricular septum is flattened in systole and diastole, consistent with right ventricular pressure and volume overload.  2. Right ventricular systolic function is severely reduced. The right ventricular size is severely enlarged. There is severely elevated pulmonary artery systolic pressure. The estimated right ventricular systolic pressure is 94.2 mmHg.  3. Left atrial size was moderately dilated.  4. Right atrial size was severely dilated.  5. Moderate pericardial effusion. The pericardial effusion is posterior to the left ventricle and localized near the right atrium. There is no evidence of cardiac tamponade.  6. The mitral valve is abnormal. Trivial mitral valve regurgitation. No evidence of mitral stenosis. 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  7. The aortic valve is tricuspid. There is moderate calcification of the aortic valve. Aortic valve regurgitation is not visualized.  the thoracic spine. No acute fracture. Review of the MIP images confirms the above findings. IMPRESSION: 1. Right upper lobe segmental and subsegmental pulmonary emboli. There is evidence of right heart strain. 2. Cardiomegaly with large pericardial effusion. 3. Moderate right pleural effusion. 4. Patchy airspace disease bilaterally with consolidation in the right lower lobe, possible atelectasis or infiltrate. 5. Aortic atherosclerosis and coronary artery calcifications. 6. Complex hyperdense lesion in the mid left kidney. Nonemergent ultrasound is recommended for further characterization. 7. Partial visualization of herniated small bowel in the right upper quadrant. Electronically Signed: By: Thornell Sartorius M.D. On: 06/22/2023 20:14   ECHOCARDIOGRAM COMPLETE  Result Date: 06/22/2023    ECHOCARDIOGRAM REPORT   Patient Name:   Carrie Guzman Date of Exam: 06/22/2023 Medical Rec #:  [id number]           Height:       64.0 in Accession #:    [id number]          Weight:       272.9 lb Date of Birth:  01/20/1946           BSA:          2.233 m Patient Age:    76 years            BP:           100/64 mmHg Patient Gender: F                   HR:           58 bpm. Exam Location:  Inpatient Procedure: 2D Echo, 3D Echo, Cardiac Doppler and Color Doppler Indications:    I50.40* Unspecified combined systolic (congestive) and diastolic                 (congestive) heart failure  History:        Patient has no prior history of Echocardiogram examinations.                 CHF, Signs/Symptoms:Edema, Shortness of Breath and Dyspnea; Risk                 Factors:Hypertension.  Sonographer:    Sheralyn Boatman RDCS Referring Phys: [id number] [facility]  Sonographer Comments: Patient is obese and suboptimal subcostal window. Image acquisition challenging due to patient body habitus. IMPRESSIONS  1. Left ventricular ejection fraction, by estimation, is 60 to 65%. The left ventricle has normal function. The left ventricle has no  regional wall motion abnormalities. Left ventricular diastolic parameters are consistent with Grade I diastolic dysfunction (impaired relaxation). There is abnormal (paradoxical) septal motion, consistent with right ventricular volume overload and the interventricular septum is flattened in systole and diastole, consistent with right ventricular pressure and volume overload.  2. Right ventricular systolic function is severely reduced. The right ventricular size is severely enlarged. There is severely elevated pulmonary artery systolic pressure. The estimated right ventricular systolic pressure is 94.2 mmHg.  3. Left atrial size was moderately dilated.  4. Right atrial size was severely dilated.  5. Moderate pericardial effusion. The pericardial effusion is posterior to the left ventricle and localized near the right atrium. There is no evidence of cardiac tamponade.  6. The mitral valve is abnormal. Trivial mitral valve regurgitation. No evidence of mitral stenosis. 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  7. The aortic valve is tricuspid. There is moderate calcification of the aortic valve. Aortic valve regurgitation is not visualized.  the thoracic spine. No acute fracture. Review of the MIP images confirms the above findings. IMPRESSION: 1. Right upper lobe segmental and subsegmental pulmonary emboli. There is evidence of right heart strain. 2. Cardiomegaly with large pericardial effusion. 3. Moderate right pleural effusion. 4. Patchy airspace disease bilaterally with consolidation in the right lower lobe, possible atelectasis or infiltrate. 5. Aortic atherosclerosis and coronary artery calcifications. 6. Complex hyperdense lesion in the mid left kidney. Nonemergent ultrasound is recommended for further characterization. 7. Partial visualization of herniated small bowel in the right upper quadrant. Electronically Signed: By: Thornell Sartorius M.D. On: 06/22/2023 20:14   ECHOCARDIOGRAM COMPLETE  Result Date: 06/22/2023    ECHOCARDIOGRAM REPORT   Patient Name:   Carrie Guzman Date of Exam: 06/22/2023 Medical Rec #:  [id number]           Height:       64.0 in Accession #:    [id number]          Weight:       272.9 lb Date of Birth:  01/20/1946           BSA:          2.233 m Patient Age:    76 years            BP:           100/64 mmHg Patient Gender: F                   HR:           58 bpm. Exam Location:  Inpatient Procedure: 2D Echo, 3D Echo, Cardiac Doppler and Color Doppler Indications:    I50.40* Unspecified combined systolic (congestive) and diastolic                 (congestive) heart failure  History:        Patient has no prior history of Echocardiogram examinations.                 CHF, Signs/Symptoms:Edema, Shortness of Breath and Dyspnea; Risk                 Factors:Hypertension.  Sonographer:    Sheralyn Boatman RDCS Referring Phys: [id number] [facility]  Sonographer Comments: Patient is obese and suboptimal subcostal window. Image acquisition challenging due to patient body habitus. IMPRESSIONS  1. Left ventricular ejection fraction, by estimation, is 60 to 65%. The left ventricle has normal function. The left ventricle has no  regional wall motion abnormalities. Left ventricular diastolic parameters are consistent with Grade I diastolic dysfunction (impaired relaxation). There is abnormal (paradoxical) septal motion, consistent with right ventricular volume overload and the interventricular septum is flattened in systole and diastole, consistent with right ventricular pressure and volume overload.  2. Right ventricular systolic function is severely reduced. The right ventricular size is severely enlarged. There is severely elevated pulmonary artery systolic pressure. The estimated right ventricular systolic pressure is 94.2 mmHg.  3. Left atrial size was moderately dilated.  4. Right atrial size was severely dilated.  5. Moderate pericardial effusion. The pericardial effusion is posterior to the left ventricle and localized near the right atrium. There is no evidence of cardiac tamponade.  6. The mitral valve is abnormal. Trivial mitral valve regurgitation. No evidence of mitral stenosis. 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  7. The aortic valve is tricuspid. There is moderate calcification of the aortic valve. Aortic valve regurgitation is not visualized.  the thoracic spine. No acute fracture. Review of the MIP images confirms the above findings. IMPRESSION: 1. Right upper lobe segmental and subsegmental pulmonary emboli. There is evidence of right heart strain. 2. Cardiomegaly with large pericardial effusion. 3. Moderate right pleural effusion. 4. Patchy airspace disease bilaterally with consolidation in the right lower lobe, possible atelectasis or infiltrate. 5. Aortic atherosclerosis and coronary artery calcifications. 6. Complex hyperdense lesion in the mid left kidney. Nonemergent ultrasound is recommended for further characterization. 7. Partial visualization of herniated small bowel in the right upper quadrant. Electronically Signed: By: Thornell Sartorius M.D. On: 06/22/2023 20:14   ECHOCARDIOGRAM COMPLETE  Result Date: 06/22/2023    ECHOCARDIOGRAM REPORT   Patient Name:   Carrie Guzman Date of Exam: 06/22/2023 Medical Rec #:  [id number]           Height:       64.0 in Accession #:    [id number]          Weight:       272.9 lb Date of Birth:  01/20/1946           BSA:          2.233 m Patient Age:    76 years            BP:           100/64 mmHg Patient Gender: F                   HR:           58 bpm. Exam Location:  Inpatient Procedure: 2D Echo, 3D Echo, Cardiac Doppler and Color Doppler Indications:    I50.40* Unspecified combined systolic (congestive) and diastolic                 (congestive) heart failure  History:        Patient has no prior history of Echocardiogram examinations.                 CHF, Signs/Symptoms:Edema, Shortness of Breath and Dyspnea; Risk                 Factors:Hypertension.  Sonographer:    Sheralyn Boatman RDCS Referring Phys: [id number] [facility]  Sonographer Comments: Patient is obese and suboptimal subcostal window. Image acquisition challenging due to patient body habitus. IMPRESSIONS  1. Left ventricular ejection fraction, by estimation, is 60 to 65%. The left ventricle has normal function. The left ventricle has no  regional wall motion abnormalities. Left ventricular diastolic parameters are consistent with Grade I diastolic dysfunction (impaired relaxation). There is abnormal (paradoxical) septal motion, consistent with right ventricular volume overload and the interventricular septum is flattened in systole and diastole, consistent with right ventricular pressure and volume overload.  2. Right ventricular systolic function is severely reduced. The right ventricular size is severely enlarged. There is severely elevated pulmonary artery systolic pressure. The estimated right ventricular systolic pressure is 94.2 mmHg.  3. Left atrial size was moderately dilated.  4. Right atrial size was severely dilated.  5. Moderate pericardial effusion. The pericardial effusion is posterior to the left ventricle and localized near the right atrium. There is no evidence of cardiac tamponade.  6. The mitral valve is abnormal. Trivial mitral valve regurgitation. No evidence of mitral stenosis. 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  7. The aortic valve is tricuspid. There is moderate calcification of the aortic valve. Aortic valve regurgitation is not visualized.

## 2023-07-06 NOTE — TOC Progression Note (Signed)
Transition of Care [facility]) - Progression Note    Patient Details  Name: Carrie Guzman MRN: [id number] Date of Birth: 1946/08/15  Transition of Care [facility]) CM/SW Contact  Coralyn Helling, [place] Phone Number: 07/06/2023, 10:20 AM  Clinical Narrative:    Awaiting o2 confirmation from [employer].    Expected Discharge Plan: Home w Home Health Services Barriers to Discharge: Continued Medical Work up  Expected Discharge Plan and Services   Discharge Planning Services: CM Consult Post Acute Care Choice: Home Health Living arrangements for the past 2 months: Single Family Home Expected Discharge Date: 07/06/23               DME Arranged: Dan Humphreys rolling DME Agency: [employer] Date DME Agency Contacted: 06/23/23 Time DME Agency Contacted: 1504 Representative spoke with at DME Agency: Vaughan Basta HH Arranged: OT HH Agency: [facility] Date [facility] Agency Contacted: 06/23/23 Time HH Agency Contacted: 1504 Representative spoke with at [facility] Agency: Elnita Maxwell   Social Determinants of Health (SDOH) Interventions SDOH Screenings   Food Insecurity: No Food Insecurity (06/21/2023)  Housing: Low Risk  (06/21/2023)  Transportation Needs: No Transportation Needs (06/21/2023)  Utilities: Not At Risk (06/21/2023)  Tobacco Use: Low Risk  (06/21/2023)  Recent Concern: Tobacco Use - Medium Risk (06/07/2023)   Received from [facility]    Readmission Risk Interventions     No data to display

## 2023-07-06 NOTE — Progress Notes (Signed)
SATURATION QUALIFICATIONS: (This note is used to comply with regulatory documentation for home oxygen)  Patient Saturations on Room Air at Rest = 89%  Patient Saturations on 2 Liters of oxygen 94%.   Did not ambulate on RA with it being 89% at rest.

## 2023-07-21 ENCOUNTER — Ambulatory Visit: Payer: Medicare Other | Admitting: Physician Assistant

## 2023-08-01 ENCOUNTER — Inpatient Hospital Stay: Payer: Medicare Other

## 2023-08-01 ENCOUNTER — Inpatient Hospital Stay: Payer: Medicare Other | Attending: Hematology | Admitting: Hematology

## 2023-08-01 VITALS — BP 91/42 | HR 57 | Temp 97.5°F | Resp 18 | Wt 207.5 lb

## 2023-08-01 DIAGNOSIS — I519 Heart disease, unspecified: Secondary | ICD-10-CM | POA: Diagnosis not present

## 2023-08-01 DIAGNOSIS — D696 Thrombocytopenia, unspecified: Secondary | ICD-10-CM | POA: Diagnosis not present

## 2023-08-01 DIAGNOSIS — I2609 Other pulmonary embolism with acute cor pulmonale: Secondary | ICD-10-CM | POA: Diagnosis not present

## 2023-08-01 DIAGNOSIS — I3139 Other pericardial effusion (noninflammatory): Secondary | ICD-10-CM | POA: Diagnosis not present

## 2023-08-01 DIAGNOSIS — I272 Pulmonary hypertension, unspecified: Secondary | ICD-10-CM | POA: Insufficient documentation

## 2023-08-01 DIAGNOSIS — D6959 Other secondary thrombocytopenia: Secondary | ICD-10-CM | POA: Insufficient documentation

## 2023-08-01 DIAGNOSIS — I2699 Other pulmonary embolism without acute cor pulmonale: Secondary | ICD-10-CM | POA: Diagnosis present

## 2023-08-01 DIAGNOSIS — E8770 Fluid overload, unspecified: Secondary | ICD-10-CM | POA: Insufficient documentation

## 2023-08-01 LAB — CMP (CANCER CENTER ONLY)
ALT: 20 U/L (ref 0–44)
AST: 28 U/L (ref 15–41)
Albumin: 3.4 g/dL — ABNORMAL LOW (ref 3.5–5.0)
Alkaline Phosphatase: 100 U/L (ref 38–126)
Anion gap: 7 (ref 5–15)
BUN: 29 mg/dL — ABNORMAL HIGH (ref 8–23)
CO2: 32 mmol/L (ref 22–32)
Calcium: 11.3 mg/dL — ABNORMAL HIGH (ref 8.9–10.3)
Chloride: 99 mmol/L (ref 98–111)
Creatinine: 1.06 mg/dL — ABNORMAL HIGH (ref 0.44–1.00)
GFR, Estimated: 55 mL/min — ABNORMAL LOW (ref >60)
Glucose, Bld: 107 mg/dL — ABNORMAL HIGH (ref 70–99)
Potassium: 3.7 mmol/L (ref 3.5–5.1)
Sodium: 138 mmol/L (ref 135–145)
Total Bilirubin: 2.8 mg/dL — ABNORMAL HIGH (ref 0.3–1.2)
Total Protein: 7.1 g/dL (ref 6.5–8.1)

## 2023-08-01 LAB — CBC WITH DIFFERENTIAL (CANCER CENTER ONLY)
Abs Immature Granulocytes: 0.02 10*3/uL (ref 0.00–0.07)
Basophils Absolute: 0 10*3/uL (ref 0.0–0.1)
Basophils Relative: 0 %
Eosinophils Absolute: 0.2 10*3/uL (ref 0.0–0.5)
Eosinophils Relative: 2 %
HCT: 43.5 % (ref 36.0–46.0)
Hemoglobin: 14.2 g/dL (ref 12.0–15.0)
Immature Granulocytes: 0 %
Lymphocytes Relative: 25 %
Lymphs Abs: 1.7 10*3/uL (ref 0.7–4.0)
MCH: 32.3 pg (ref 26.0–34.0)
MCHC: 32.6 g/dL (ref 30.0–36.0)
MCV: 99.1 fL (ref 80.0–100.0)
Monocytes Absolute: 0.9 10*3/uL (ref 0.1–1.0)
Monocytes Relative: 12 %
Neutro Abs: 4.2 10*3/uL (ref 1.7–7.7)
Neutrophils Relative %: 61 %
Platelet Count: 145 10*3/uL — ABNORMAL LOW (ref 150–400)
RBC: 4.39 MIL/uL (ref 3.87–5.11)
RDW: 15.2 % (ref 11.5–15.5)
Smear Review: NORMAL
WBC Count: 7 10*3/uL (ref 4.0–10.5)
nRBC: 0 % (ref 0.0–0.2)

## 2023-08-01 LAB — IMMATURE PLATELET FRACTION: Immature Platelet Fraction: 1.4 % (ref 1.2–8.6)

## 2023-08-01 LAB — ANTITHROMBIN III: AntiThromb III Func: 90 % (ref 75–120)

## 2023-08-01 NOTE — Progress Notes (Signed)
HEMATOLOGY/ONCOLOGY CLINIC NOTE  Date of Service: 08/01/2023  Patient Care Team: Melida Quitter, MD as PCP - General (Internal Medicine) Quintella Reichert, MD as PCP - Cardiology (Cardiology) Janalyn Harder, MD (Inactive) as Consulting Physician (Dermatology)  CHIEF COMPLAINTS/PURPOSE OF CONSULTATION:  Thrombocytopenia and Pulmonary embolism   HISTORY OF PRESENTING ILLNESS:    Carrie Guzman is a wonderful 77 y.o. female who has been referred to [place] by attending Dr. Stephania Fragmin, MD, for evaluation and management of Thrombocytopenia. Patient's lab shows platelets have dropped from 111 K, on 06/21/2023, to 65 K today, 06/23/2023.    Patient has a history of chronic lower extremity lymphedema/venous insufficiency, hypertension, morbid obesity .Body mass index is 46.31 kg/m.  Who was seen as outpatient for 2 weeks ago for upper respiratory infection and had testing which showed she was COVID-negative.  Patient notes she was treated with steroids. She presented again to [facility] with increasing shortness of breath and was noted to have heart rate in the 50s and oxygen saturation of 86% with some decreased mentation per her husband. Patient was initially thought to have heart failure and was started on diuretics and had an echo which showed severe pulmonary hypertension and right-sided cor pulmonale. Subsequent CTA of the chest showed significant right upper lobe segmental and subsegmental pulmonary emboli with evidence of right heart strain.  Cardiomegaly with moderate to large pericardial effusion.   Patient is thought to have obstructive sleep apnea/obesity related hypoventilation at baseline that is undiagnosed might be causing some element of pulmonary hypertension.    She was started on IV heparin.  She was noted to have elevated bilirubin level on admission likely due to hepatic congestion this is improving. She was also noted to have slight thrombocytopenia of 111k  on admission.  Platelet counts today dropped to 65k will consulted to evaluate her thrombocytopenia. No other recent hospitalization blood counts were available for comparison to determine if her thrombocytopenia is chronic. Patient denies any bleeding issues.  No nosebleeds no gum bleeds hematuria or hematochezia. Denies any previous known history of thrombocytopenia. Has been taking Tylenol arthritis for joint issues.  Denies significant other NSAID use. Husband was at bedside during this hospital  interview.  INTERVAL HISTORY:  Carrie Guzman is a wonderful 77 y.o. female who is here for continued evaluation and management of Thrombocytopenia and Pulmonary embolism. Patient was last seen by me as an in-patient on 06/28/2023.   Patient is accompanied by her husband during this visit. Patient notes that her bilateral leg swelling has been slowly improving since she got discharged. She notes that her breathing has been slowly improving. Patient uses oxygen tank at home and notes that her SpO2 is around 90-95% at home. Patient has upcoming Cardiologist and Pulmonologist appointments. She is going to have sleep apnea study.  Patient denies any past medical history of blood clots. She denies any new infection issues, fever, chills, night sweats, chest pain, abnormal bowel movement, back pain, bone pain, abdominal pain. She does complain of occasional SOB when she walks/exercises and bilateral leg swelling. Denies SOB while she is seating or laying in bed.   Patient notes that she has physical therapy at home.   She has been regularly taking her blood thinner as prescribed. She has been tolerating her blood thinner, with mild toxicity of nose bleeds. Patient notes that her nose bleeds are controlled with saline nasal spray.   MEDICAL HISTORY:  Past Medical History:  Diagnosis Date  Elevated cholesterol    Hypertension    Squamous cell carcinoma of skin 05/12/2021   Left Lower Leg -  anterior (in situ) (curet and 5FU)    SURGICAL HISTORY: No past surgical history on file.  SOCIAL HISTORY: Social History   Socioeconomic History   Marital status: Married    Spouse name: Not on file   Number of children: Not on file   Years of education: Not on file   Highest education level: Not on file  Occupational History   Not on file  Tobacco Use   Smoking status: Never    Passive exposure: Never   Smokeless tobacco: Never  Vaping Use   Vaping status: Never Used  Substance and Sexual Activity   Alcohol use: Never   Drug use: Never   Sexual activity: Not on file  Other Topics Concern   Not on file  Social History Narrative   Not on file   Social Determinants of Health   Financial Resource Strain: Not on file  Food Insecurity: No Food Insecurity (06/21/2023)   Hunger Vital Sign    Worried About Running Out of Food in the Last Year: Never true    Ran Out of Food in the Last Year: Never true  Transportation Needs: No Transportation Needs (06/21/2023)   PRAPARE - Administrator, Civil Service (Medical): No    Lack of Transportation (Non-Medical): No  Physical Activity: Not on file  Stress: Not on file  Social Connections: Not on file  Intimate Partner Violence: Not At Risk (06/21/2023)   Humiliation, Afraid, Rape, and Kick questionnaire    Fear of Current or Ex-Partner: No    Emotionally Abused: No    Physically Abused: No    Sexually Abused: No    FAMILY HISTORY: No family history on file.  ALLERGIES:  has No Known Allergies.  MEDICATIONS:  Current Outpatient Medications  Medication Sig Dispense Refill   acetaminophen (TYLENOL) 650 MG CR tablet Take 1,300 mg by mouth as needed for pain.     apixaban (ELIQUIS) 5 MG TABS tablet Take 1 tablet (5 mg total) by mouth 2 (two) times daily. 60 tablet 2   atenolol (TENORMIN) 50 MG tablet Take by mouth.     furosemide (LASIX) 40 MG tablet Take 1 tablet by mouth daily.     losartan (COZAAR) 50 MG  tablet Take by mouth.     Menthol, Topical Analgesic, (BIOFREEZE EX) Apply 1 Application topically daily.     Multiple Vitamins-Minerals (CENTRUM SILVER 50+WOMEN) TABS Take 1 tablet by mouth daily.     Omega-3 Fatty Acids (FISH OIL BURP-LESS PO) Take 1 capsule by mouth daily.     potassium chloride (KLOR-CON) 10 MEQ tablet Take by mouth.     rosuvastatin (CRESTOR) 10 MG tablet Take by mouth.     Turmeric (QC TUMERIC COMPLEX PO) Take 1 tablet by mouth daily.     No current facility-administered medications for this visit.    REVIEW OF SYSTEMS:    10 Point review of Systems was done is negative except as noted above.  PHYSICAL EXAMINATION: ECOG PERFORMANCE STATUS: 2 - Symptomatic, <50% confined to bed  . Vitals:   08/01/23 0921  BP: (!) 91/42  Pulse: (!) 57  Resp: 18  Temp: (!) 97.5 F (36.4 C)  SpO2: 94%   Filed Weights   08/01/23 0921  Weight: 207 lb 8 oz (94.1 kg)   .Body mass index is 35.62 kg/m.  GENERAL:alert, in no  acute distress and comfortable SKIN: no acute rashes, no significant lesions EYES: conjunctiva are pink and non-injected, sclera anicteric OROPHARYNX: MMM, no exudates, no oropharyngeal erythema or ulceration NECK: supple, no JVD LYMPH:  no palpable lymphadenopathy in the cervical, axillary or inguinal regions LUNGS: clear to auscultation b/l with normal respiratory effort HEART: regular rate & rhythm ABDOMEN:  normoactive bowel sounds , non tender, not distended. Extremity: no pedal edema PSYCH: alert & oriented x 3 with fluent speech NEURO: no focal motor/sensory deficits  LABORATORY DATA:  I have reviewed the data as listed  .    Latest Ref Rng & Units 07/06/2023    5:01 AM 07/04/2023    5:11 AM 07/01/2023    1:52 AM  CBC  WBC 4.0 - 10.5 K/uL 5.9  6.6  6.9   Hemoglobin 12.0 - 15.0 g/dL 19.1  47.8  29.5   Hematocrit 36.0 - 46.0 % 45.9  45.8  45.2   Platelets 150 - 400 K/uL 138  92  83     .    Latest Ref Rng & Units 07/06/2023    5:01  AM 07/05/2023   12:09 PM 07/04/2023    5:11 AM  CMP  Glucose 70 - 99 mg/dL 92  621  91   BUN 8 - 23 mg/dL 17  16  15    Creatinine 0.44 - 1.00 mg/dL 3.08  6.57  8.46   Sodium 135 - 145 mmol/L 135  136  137   Potassium 3.5 - 5.1 mmol/L 3.0  3.1  3.7   Chloride 98 - 111 mmol/L 96  97  98   CO2 22 - 32 mmol/L 30  30  32   Calcium 8.9 - 10.3 mg/dL 96.2  95.2  9.9   Total Protein 6.5 - 8.1 g/dL 6.6   6.7   Total Bilirubin 0.3 - 1.2 mg/dL 2.9   3.7   Alkaline Phos 38 - 126 U/L 106   99   AST 15 - 41 U/L 23   27   ALT 0 - 44 U/L 18   21      RADIOGRAPHIC STUDIES: I have personally reviewed the radiological images as listed and agreed with the findings in the report. No results found.  ASSESSMENT & PLAN:  77 year old female with a history of morbid obesity, possible CHF, hypertension, dyslipidemia with   #1 significant right upper lobe segmental and subsegmental pulmonary embolism with right heart strain.   #2 severe pulmonary hypertension thought to be related to PE but also due to likely undiagnosed OSA/OHS diastolic CHF.   #3 moderate to large pericardial effusion-being evaluated by cardiology.   #4 Thrombocytopenia in the context of large PE, recent URI recent use of heparin. No overt platelet clumping Could also be related to recent viral URI. Could also be from significant hepatic congestion in the context of increased venous pressures Dilutional due to volume overload  PLAN: -Discussed lab results from 08//29/2024 with the patient. CBC showed slightly decreased platelets at 138 K, but stable overall. CMP showed elevated bilirubin level at 2.9.  -Discussed the reason for the visit.  -Discussed that patient's thrombocytopenia is due to other medical factors including her blood clot and medicine. Her thrombocytopenia has improved since discharge, so no need to take other actions for thrombocytopenia.  -Discussed the risk factors for blood clots.  -Discussed that the next step is  for additional lab workup and genetic testing for her blood clot. Patient agrees.  -Patient will have  lab workup and genetic testing during this visit.   . Orders Placed This Encounter  Procedures   CBC with Differential (Cancer Center Only)    Standing Status:   Future    Number of Occurrences:   1    Standing Expiration Date:   07/31/2024   CMP (Cancer Center only)    Standing Status:   Future    Number of Occurrences:   1    Standing Expiration Date:   07/31/2024   Antithrombin III    Standing Status:   Future    Number of Occurrences:   1    Standing Expiration Date:   07/31/2024   Protein C activity    Standing Status:   Future    Number of Occurrences:   1    Standing Expiration Date:   07/31/2024   Protein C, total    Standing Status:   Future    Number of Occurrences:   1    Standing Expiration Date:   07/31/2024   Protein S activity    Standing Status:   Future    Number of Occurrences:   1    Standing Expiration Date:   07/31/2024   Protein S, total    Standing Status:   Future    Number of Occurrences:   1    Standing Expiration Date:   07/31/2024   Lupus anticoagulant panel    Standing Status:   Future    Number of Occurrences:   1    Standing Expiration Date:   07/31/2024   Beta-2-glycoprotein i abs, IgG/M/A    Standing Status:   Future    Number of Occurrences:   1    Standing Expiration Date:   07/31/2024   Factor 5 leiden    Standing Status:   Future    Number of Occurrences:   1    Standing Expiration Date:   07/31/2024   Prothrombin gene mutation    Standing Status:   Future    Number of Occurrences:   1    Standing Expiration Date:   07/31/2024   Cardiolipin antibodies, IgG, IgM, IgA    Standing Status:   Future    Number of Occurrences:   1    Standing Expiration Date:   07/31/2024   Immature Platelet Fraction    Standing Status:   Future    Number of Occurrences:   1    Standing Expiration Date:   07/31/2024    FOLLOW-UP: Labs today Phone visit  with Dr. Candise Che in 2 weeks  The total time spent in the appointment was 40 minutes* .  All of the patient's questions were answered with apparent satisfaction. The patient knows to call the clinic with any problems, questions or concerns.  Wyvonnia Lora MD MS AAHIVMS [facility] Hematology/Oncology Physician [facility]  .*Total Encounter Time as defined by the Centers for Medicare and Medicaid Services includes, in addition to the face-to-face time of a patient visit (documented in the note above) non-face-to-face time: obtaining and reviewing outside history, ordering and reviewing medications, tests or procedures, care coordination (communications with other health care professionals or caregivers) and documentation in the medical record.   I,Param Shah,acting as a Neurosurgeon for Wyvonnia Lora, MD.,have documented all relevant documentation on the behalf of Wyvonnia Lora, MD,as directed by  Wyvonnia Lora, MD while in the presence of Wyvonnia Lora, MD.

## 2023-08-02 ENCOUNTER — Encounter (HOSPITAL_COMMUNITY): Payer: Self-pay | Admitting: Cardiology

## 2023-08-02 ENCOUNTER — Ambulatory Visit (HOSPITAL_COMMUNITY)
Admission: RE | Admit: 2023-08-02 | Discharge: 2023-08-02 | Disposition: A | Discharge status: Home / Self Care | Payer: Medicare Other | Source: Ambulatory Visit | Attending: Cardiology | Admitting: Cardiology

## 2023-08-02 VITALS — BP 96/58 | HR 60 | Wt 206.0 lb

## 2023-08-02 DIAGNOSIS — Z79899 Other long term (current) drug therapy: Secondary | ICD-10-CM | POA: Diagnosis not present

## 2023-08-02 DIAGNOSIS — I272 Pulmonary hypertension, unspecified: Secondary | ICD-10-CM | POA: Diagnosis present

## 2023-08-02 DIAGNOSIS — Z86711 Personal history of pulmonary embolism: Secondary | ICD-10-CM | POA: Insufficient documentation

## 2023-08-02 DIAGNOSIS — I509 Heart failure, unspecified: Secondary | ICD-10-CM | POA: Diagnosis not present

## 2023-08-02 LAB — LUPUS ANTICOAGULANT PANEL
DRVVT: 110.1 s — ABNORMAL HIGH (ref 0.0–47.0)
PTT Lupus Anticoagulant: 50.4 s — ABNORMAL HIGH (ref 0.0–43.5)

## 2023-08-02 LAB — PROTEIN S ACTIVITY: Protein S Activity: 56 % — ABNORMAL LOW (ref 63–140)

## 2023-08-02 LAB — PROTEIN C, TOTAL: Protein C, Total: 93 % (ref 60–150)

## 2023-08-02 LAB — PROTEIN C ACTIVITY: Protein C Activity: 116 % (ref 73–180)

## 2023-08-02 LAB — PTT-LA MIX: PTT-LA Mix: 43.2 s — ABNORMAL HIGH (ref 0.0–40.5)

## 2023-08-02 LAB — PROTEIN S, TOTAL: Protein S Ag, Total: 96 % (ref 60–150)

## 2023-08-02 LAB — DRVVT CONFIRM: dRVVT Confirm: 0.9 ratio (ref 0.8–1.2)

## 2023-08-02 LAB — HEXAGONAL PHASE PHOSPHOLIPID: Hexagonal Phase Phospholipid: 7 s (ref 0–11)

## 2023-08-02 LAB — DRVVT MIX: dRVVT Mix: 56.3 s — ABNORMAL HIGH (ref 0.0–40.4)

## 2023-08-02 MED ORDER — METOPROLOL SUCCINATE ER 25 MG PO TB24: 25.0000 mg | ORAL_TABLET | Freq: Every day | ORAL | 5 refills | Status: DC

## 2023-08-02 MED ORDER — LOSARTAN POTASSIUM 25 MG PO TABS: 25.0000 mg | ORAL_TABLET | Freq: Every day | ORAL | 5 refills | Status: DC

## 2023-08-02 NOTE — Progress Notes (Addendum)
Patient Name: Carrie Guzman         DOB: 05-31-1946      Height:  5'4    Weight: 206lbs  Office Name: [facility]         Referring Provider: [name], MD  Today's Date: 08/02/2023  Date:  08/02/2023 STOP BANG RISK ASSESSMENT S (snore) Have you been told that you snore?     NO   T (tired) Are you often tired, fatigued, or sleepy during the day?   NO  O (obstruction) Do you stop breathing, choke, or gasp during sleep? NO   P (pressure) Do you have or are you being treated for high blood pressure? YES   B (BMI) Is your body index greater than 35 kg/m? YES   A (age) Are you 30 years old or older? YES   N (neck) Do you have a neck circumference greater than 16 inches?   YES/NO   G (gender) Are you a female? NO   TOTAL STOP/BANG "YES" ANSWERS 3                                                                       For Office Use Only              Procedure Order Form    YES to 3+ Stop Bang questions OR two clinical symptoms - patient qualifies for WatchPAT (CPT 95800)             Clinical Notes: Will consult Sleep Specialist and refer for management of therapy due to patient increased risk of Sleep Apnea. Ordering a sleep study due to the following two clinical symptoms: Memory problems or poor judgment G31.84/ History of high blood pressure R03.0    I understand that I am proceeding with a home sleep apnea test as ordered by my treating physician. I understand that untreated sleep apnea is a serious cardiovascular risk factor and it is my responsibility to perform the test and seek management for sleep apnea. I will be contacted with the results and be managed for sleep apnea by a local sleep physician. I will be receiving equipment and further instructions from [facility]. I shall promptly ship back the equipment via the included mailing label. I understand my insurance will be billed for the test and as the patient I am responsible for any insurance  related out-of-pocket costs incurred. I have been provided with written instructions and can call for additional video or telephonic instruction, with 24-hour availability of qualified personnel to answer any questions: Patient Help Desk [phone].  Patient Signature ______________________________________________________   Date______________________ Patient Telemedicine Verbal Consent

## 2023-08-02 NOTE — Progress Notes (Signed)
   PULMONARY HYPERTENSION NEW PATIENT CLINIC NOTE  Referring Physician: Melida Quitter, MD  Primary Care: Melida Quitter, MD Primary Cardiologist:  HPI: Carrie Guzman is a 77 y.o. female with a PMH of lymphedema, thrombocytopenia, pericardial effusion who presents for initial visit for further evaluation and treatment of pulmonary hypertension.    Patient had no previous cardiac history but was admitted to the hospital in August with concerns of shortness of breath ongoing for 2-3 weeks. Patient was diagnosed with CHF exacerbation. She had elevated BNP, total bilirubin was elevated. Upon admission started on diuretics. Echocardiogram showed preserved EF with elevated PASP, cor pulmonale and pericardial effusion without tamponade. CTA chest showed acute pulmonary embolism but right side heart strain, case was discussed with pulmonary who recommended heparin otherwise no indication for thrombectomy at this time. She was discharged with diuretics and presents for follow up.        SUBJECTIVE: Patient reports that since discharge she has good days and bad days. She is able to get around with a walker, but has frequent episodes of shortness of breath, and occasional lightheaded spells where she will see flashing lights and have to stop and rest. She has been complaint with her medical therapy, but her BP remains low.   PMH, current medications, allergies, social history, and family history reviewed in epic.  PHYSICAL EXAM: Vitals:   08/02/23 1000  BP: (!) 96/58  Pulse: 60  SpO2: 97%   GENERAL: Chronically ill appearing HEENT: MMM  Lungs- Diminished breath sounds, normal WOB CARDIAC:  JVP: Moderately elevated          Bradycardic, prominent P2, no systolic murmur.  ABDOMEN: Soft, non-tender, non-distended. Marland Kitchen  EXTREMITIES: Extensive lymphedema NEUROLOGIC: Patient is oriented x3 with no focal or lateralizing neurologic deficits.  PSYCH: Patients affect is appropriate, there is no  evidence of anxiety or depression.  SKIN: Warm and dry; no lesions or wounds.   DATA REVIEW  ECG: Sinus rhythm, right bundle branch block, frequent PACs.   ECHO: Severely dilated RV, elevated RVSP, near complete obliteration of LV, underfilled. As per my personal interpretation   Etiology of PH: Workup ongoing WHO Functional class: III : Poor functional status Volume status: Volume up Pulmonary vasodilators: Workup and RHC pending Advanced therapies Not a candidate      ASSESSMENT & PLAN:  1. Pulmonary hypertension: Based on echocardiogram, severely elevated RVSP with RV dilation and elevated filling pressures.  Degree of PAH is out of proportion to her recent pulmonary embolism.  Likely chronic, and probably contributed to lower extremity edema.  Does not need this degree of blood pressure control or nodal blockade, reduction as below. Does not appear to have a significant group II component. Will hold on PH therapy until RHC.  - Continue oral diuresis - Sleep study, V/Q scan, PFTs - RHC in 2 months - Losartan to 25mg  daily - Atenolol d/c, start metoprolol succinate 25mg  daily - Follow up in 3 months   Carrie Hasten, MD [facility] Circulatory Support 08/02/23

## 2023-08-02 NOTE — Patient Instructions (Addendum)
No Labs done today.   STOP taking Atenolol  DECREASE Losartan to 25mg  (1 tablet) by mouth daily.  START Metoprolol succinate 25mg  (1 tablet) by mouth daily.  No other medication changes were made. Please continue all current medications as prescribed.  Your physician recommends that you schedule a follow-up appointment in: 2 months for a right heart cath and in 3 months with Dr. Elwyn Lade.   If you have any questions or concerns before your next appointment please send [place] a message through [place] or call our office at [phone].    TO LEAVE A MESSAGE FOR THE NURSE SELECT OPTION 2, PLEASE LEAVE A MESSAGE INCLUDING: YOUR NAME DATE OF BIRTH CALL BACK NUMBER REASON FOR CALL**this is important as we prioritize the call backs  YOU WILL RECEIVE A CALL BACK THE SAME DAY AS LONG AS YOU CALL BEFORE 4:00 PM   Do the following things EVERYDAY: Weigh yourself in the morning before breakfast. Write it down and keep it in a log. Take your medicines as prescribed Eat low salt foods--Limit salt (sodium) to 2000 mg per day.  Stay as active as you can everyday Limit all fluids for the day to less than 2 liters   At the [facility], you and your health needs are our priority. As part of our continuing mission to provide you with exceptional heart care, we have created designated Provider Care Teams. These Care Teams include your primary Cardiologist (physician) and Advanced Practice Providers (APPs- Physician Assistants and Nurse Practitioners) who all work together to provide you with the care you need, when you need it.   You may see any of the following providers on your designated Care Team at your next follow up: Dr Arvilla Meres Dr Marca Ancona Dr. Marcos Eke, NP Robbie Lis, [place] [facility] [place], [place] Brynda Peon, NP Karle Plumber, PharmD   Please be sure to bring in all your medications bottles to every appointment.    Thank  you for choosing [facility]      You are scheduled for a Cardiac Catheterization on Tuesday, December 3 with Dr.  [name] .  1. Please arrive at the [facility] (Main Entrance A) at [facility]: [address] [place], [place] [address] at 10:00 AM (This time is 2 hour(s) before your procedure to ensure your preparation). Free valet parking service is available. You will check in at ADMITTING. The support person will be asked to wait in the waiting room.  It is OK to have someone drop you off and come back when you are ready to be discharged.    Special note: Every effort is made to have your procedure done on time. Please understand that emergencies sometimes delay scheduled procedures.  2. Diet: Do not eat solid foods after midnight.  The patient may have clear liquids until 5am upon the day of the procedure.  3. Medication instructions in preparation for your procedure:  DO NOT TAKE YOUR LASIX THE DAY OF YOUR PROCEDURE  On the morning of your procedure, take any morning medicines NOT listed above.  You may use sips of water.  4. Plan to go home the same day, you will only stay overnight if medically necessary. 5. Bring a current list of your medications and current insurance cards. 6. You MUST have a responsible person to drive you home. 7. Someone MUST be with you the first 24 hours after you arrive home or  your discharge will be delayed. 8. Please wear clothes that are easy to get on and off and wear slip-on shoes.  Thank you for allowing [place] to care for you!   -- [facility]

## 2023-08-03 LAB — BETA-2-GLYCOPROTEIN I ABS, IGG/M/A
Beta-2 Glyco I IgG: <9 GPI IgG units (ref 0–20)
Beta-2-Glycoprotein I IgA: 15 GPI IgA units (ref 0–25)
Beta-2-Glycoprotein I IgM: 11 GPI IgM units (ref 0–32)

## 2023-08-03 LAB — CARDIOLIPIN ANTIBODIES, IGG, IGM, IGA
Anticardiolipin IgA: <9 APL U/mL (ref 0–11)
Anticardiolipin IgG: <9 GPL U/mL (ref 0–14)
Anticardiolipin IgM: 11 MPL U/mL (ref 0–12)

## 2023-08-04 ENCOUNTER — Telehealth (HOSPITAL_COMMUNITY): Payer: Self-pay | Admitting: Cardiology

## 2023-08-04 NOTE — Telephone Encounter (Signed)
Pt left VM with concerns of low b/p despite recent med changes  Report when she woke up b/p 102/60 After meds 90/67 ~1 HOUR LATER sbp 69  HHRN confirms SBP in 90 at the time of home visit    Would like to know any additional med changes are needed and goal for b/p  Note pt also reports o2 sats dropped into the 80 last night

## 2023-08-07 ENCOUNTER — Ambulatory Visit: Payer: Medicare Other | Admitting: Hematology

## 2023-08-07 LAB — FACTOR 5 LEIDEN

## 2023-08-07 MED ORDER — LOSARTAN POTASSIUM 25 MG PO TABS: 12.5000 mg | ORAL_TABLET | Freq: Every evening | ORAL | 5 refills | Status: DC

## 2023-08-07 NOTE — Telephone Encounter (Signed)
Pt aware and voiced understanding 

## 2023-08-10 ENCOUNTER — Other Ambulatory Visit (HOSPITAL_COMMUNITY): Payer: Self-pay

## 2023-08-10 DIAGNOSIS — I272 Pulmonary hypertension, unspecified: Secondary | ICD-10-CM

## 2023-08-11 LAB — PROTHROMBIN GENE MUTATION

## 2023-08-21 ENCOUNTER — Telehealth (HOSPITAL_COMMUNITY): Payer: Self-pay | Admitting: Cardiology

## 2023-08-21 NOTE — Telephone Encounter (Signed)
Called patient at [phone]" to inform her of PFT test on 11/03/2023 at 08:00 am.   Left a voicemail on her telephone regarding appointment date, time, location, and instructions.

## 2023-08-23 ENCOUNTER — Other Ambulatory Visit: Payer: Self-pay | Admitting: Internal Medicine

## 2023-08-23 ENCOUNTER — Inpatient Hospital Stay: Payer: Medicare Other | Attending: Hematology | Admitting: Hematology

## 2023-08-23 DIAGNOSIS — I2693 Single subsegmental pulmonary embolism without acute cor pulmonale: Secondary | ICD-10-CM | POA: Insufficient documentation

## 2023-08-23 DIAGNOSIS — D696 Thrombocytopenia, unspecified: Secondary | ICD-10-CM

## 2023-08-23 DIAGNOSIS — Z79899 Other long term (current) drug therapy: Secondary | ICD-10-CM | POA: Diagnosis not present

## 2023-08-23 DIAGNOSIS — Z7901 Long term (current) use of anticoagulants: Secondary | ICD-10-CM | POA: Diagnosis not present

## 2023-08-23 DIAGNOSIS — D6959 Other secondary thrombocytopenia: Secondary | ICD-10-CM | POA: Insufficient documentation

## 2023-08-23 DIAGNOSIS — N2889 Other specified disorders of kidney and ureter: Secondary | ICD-10-CM

## 2023-08-23 DIAGNOSIS — I2609 Other pulmonary embolism with acute cor pulmonale: Secondary | ICD-10-CM

## 2023-08-23 NOTE — Progress Notes (Signed)
HEMATOLOGY/ONCOLOGY PHONE VISIT NOTE  Date of Service: 08/23/2023  Patient Care Team: Melida Quitter, MD as PCP - General (Internal Medicine) Quintella Reichert, MD as PCP - Cardiology (Cardiology) Janalyn Harder, MD (Inactive) as Consulting Physician (Dermatology) Johney Maine, MD as Consulting Physician (Hematology)  CHIEF COMPLAINTS/PURPOSE OF CONSULTATION:  Thrombocytopenia and Pulmonary embolism   HISTORY OF PRESENTING ILLNESS:    Carrie Guzman is a wonderful 77 y.o. female who has been referred to [place] by attending Dr. Stephania Fragmin, MD, for evaluation and management of Thrombocytopenia. Patient's lab shows platelets have dropped from 111 K, on 06/21/2023, to 65 K today, 06/23/2023.    Patient has a history of chronic lower extremity lymphedema/venous insufficiency, hypertension, morbid obesity .Body mass index is 46.31 kg/m.  Who was seen as outpatient for 2 weeks ago for upper respiratory infection and had testing which showed she was COVID-negative.  Patient notes she was treated with steroids. She presented again to [facility] with increasing shortness of breath and was noted to have heart rate in the 50s and oxygen saturation of 86% with some decreased mentation per her husband. Patient was initially thought to have heart failure and was started on diuretics and had an echo which showed severe pulmonary hypertension and right-sided cor pulmonale. Subsequent CTA of the chest showed significant right upper lobe segmental and subsegmental pulmonary emboli with evidence of right heart strain.  Cardiomegaly with moderate to large pericardial effusion.   Patient is thought to have obstructive sleep apnea/obesity related hypoventilation at baseline that is undiagnosed might be causing some element of pulmonary hypertension.    She was started on IV heparin.  She was noted to have elevated bilirubin level on admission likely due to hepatic congestion this is  improving. She was also noted to have slight thrombocytopenia of 111k on admission.  Platelet counts today dropped to 65k will consulted to evaluate her thrombocytopenia. No other recent hospitalization blood counts were available for comparison to determine if her thrombocytopenia is chronic. Patient denies any bleeding issues.  No nosebleeds no gum bleeds hematuria or hematochezia. Denies any previous known history of thrombocytopenia. Has been taking Tylenol arthritis for joint issues.  Denies significant other NSAID use. Husband was at bedside during this hospital  interview.  INTERVAL HISTORY:  Carrie Guzman is a wonderful 78 y.o. female who is here for continued evaluation and management of Thrombocytopenia and Pulmonary embolism. Patient was last seen by me on 08/01/2023 and complained of occasional SOB on exertion, bilateral leg swelling, and nose bleeds.  I connected with Carrie Guzman on 08/23/23 at  8:40 AM EDT by telephone visit and verified that I am speaking with the correct person using two identifiers.   I discussed the limitations, risks, security and privacy concerns of performing an evaluation and management service by telemedicine and the availability of in-person appointments. I also discussed with the patient that there may be a patient responsible charge related to this service. The patient expressed understanding and agreed to proceed.   Other persons participating in the visit and their role in the encounter: none   Patient's location: home  Provider's location: [facility]   Chief Complaint: Thrombocytopenia and Pulmonary embolism    Today, the results of her lab workup for thrombocytopenia and DVT and extensive PE was discussed with her in detail.  MEDICAL HISTORY:  Past Medical History:  Diagnosis Date   Elevated cholesterol    Hypertension    Squamous cell  carcinoma of skin 05/12/2021   Left Lower Leg - anterior (in situ) (curet and 5FU)    SURGICAL  HISTORY: No past surgical history on file.  SOCIAL HISTORY: Social History   Socioeconomic History   Marital status: Married    Spouse name: Not on file   Number of children: Not on file   Years of education: Not on file   Highest education level: Not on file  Occupational History   Not on file  Tobacco Use   Smoking status: Never    Passive exposure: Never   Smokeless tobacco: Never  Vaping Use   Vaping status: Never Used  Substance and Sexual Activity   Alcohol use: Never   Drug use: Never   Sexual activity: Not on file  Other Topics Concern   Not on file  Social History Narrative   Not on file   Social Determinants of Health   Financial Resource Strain: Not on file  Food Insecurity: No Food Insecurity (06/21/2023)   Hunger Vital Sign    Worried About Running Out of Food in the Last Year: Never true    Ran Out of Food in the Last Year: Never true  Transportation Needs: No Transportation Needs (06/21/2023)   PRAPARE - Administrator, Civil Service (Medical): No    Lack of Transportation (Non-Medical): No  Physical Activity: Not on file  Stress: Not on file  Social Connections: Not on file  Intimate Partner Violence: Not At Risk (06/21/2023)   Humiliation, Afraid, Rape, and Kick questionnaire    Fear of Current or Ex-Partner: No    Emotionally Abused: No    Physically Abused: No    Sexually Abused: No    FAMILY HISTORY: No family history on file.  ALLERGIES:  has No Known Allergies.  MEDICATIONS:  Current Outpatient Medications  Medication Sig Dispense Refill   acetaminophen (TYLENOL) 650 MG CR tablet Take 1,300 mg by mouth as needed for pain.     apixaban (ELIQUIS) 5 MG TABS tablet Take 1 tablet (5 mg total) by mouth 2 (two) times daily. 60 tablet 2   furosemide (LASIX) 40 MG tablet Take 1 tablet by mouth daily.     losartan (COZAAR) 25 MG tablet Take 0.5 tablets (12.5 mg total) by mouth at bedtime. 15 tablet 5   Menthol, Topical Analgesic,  (BIOFREEZE EX) Apply 1 Application topically daily. As needed     metoprolol succinate (TOPROL XL) 25 MG 24 hr tablet Take 1 tablet (25 mg total) by mouth daily. 30 tablet 5   Multiple Vitamins-Minerals (CENTRUM SILVER 50+WOMEN) TABS Take 1 tablet by mouth daily.     potassium chloride (KLOR-CON) 10 MEQ tablet Take 10 mEq by mouth daily.     rosuvastatin (CRESTOR) 10 MG tablet Take 10 mg by mouth daily.     No current facility-administered medications for this visit.    REVIEW OF SYSTEMS:    10 Point review of Systems was done is negative except as noted above.   PHYSICAL EXAMINATION: TELEMEDICINE VISIT  LABORATORY DATA:  I have reviewed the data as listed  .    Latest Ref Rng & Units 08/01/2023   10:39 AM 07/06/2023    5:01 AM 07/04/2023    5:11 AM  CBC  WBC 4.0 - 10.5 K/uL 7.0  5.9  6.6   Hemoglobin 12.0 - 15.0 g/dL 16.1  09.6  04.5   Hematocrit 36.0 - 46.0 % 43.5  45.9  45.8   Platelets 150 -  400 K/uL 145  138  92     .    Latest Ref Rng & Units 08/01/2023   10:39 AM 07/06/2023    5:01 AM 07/05/2023   12:09 PM  CMP  Glucose 70 - 99 mg/dL 161  92  096   BUN 8 - 23 mg/dL 29  17  16    Creatinine 0.44 - 1.00 mg/dL 0.45  4.09  8.11   Sodium 135 - 145 mmol/L 138  135  136   Potassium 3.5 - 5.1 mmol/L 3.7  3.0  3.1   Chloride 98 - 111 mmol/L 99  96  97   CO2 22 - 32 mmol/L 32  30  30   Calcium 8.9 - 10.3 mg/dL 91.4  78.2  95.6   Total Protein 6.5 - 8.1 g/dL 7.1  6.6    Total Bilirubin 0.3 - 1.2 mg/dL 2.8  2.9    Alkaline Phos 38 - 126 U/L 100  106    AST 15 - 41 U/L 28  23    ALT 0 - 44 U/L 20  18       RADIOGRAPHIC STUDIES: I have personally reviewed the radiological images as listed and agreed with the findings in the report. No results found.  ASSESSMENT & PLAN:  77 year old female with a history of morbid obesity, possible CHF, hypertension, dyslipidemia with   #1 significant right upper lobe segmental and subsegmental pulmonary embolism with right heart  strain.   #2 severe pulmonary hypertension thought to be related to PE but also due to likely undiagnosed OSA/OHS diastolic CHF.   #3 moderate to large pericardial effusion-being evaluated by cardiology.   #4 Thrombocytopenia in the context of large PE, recent URI recent use of heparin. No overt platelet clumping Could also be related to recent viral URI. Could also be from significant hepatic congestion in the context of increased venous pressures Dilutional due to volume overload  PLAN:  -Discussed lab results from 08/01/2023 in detail with patient. CBC showed WBC of 7.0K, hemoglobin of 14.2, and platelets of 145K. -CBC near normal. Platelet levels have nearly normalized.  1. Thrombocytopenia concerning for viral URI and liver congestion has resolved  2. DVTs and PEs  -borderline protein S level, no other findings of obvious blood clotting issues -Discussed Hypercoagulability workup in detail -prothrombin gene mutation, factor X leiden testing negative -Protein C levels normal. Protein S level is borderline low at 56% -antiphospholipid antibody testing negative -Lupus anticoagulant testing normal -Beta 2 glycoprotein negative -continue eliquis at this time. From a blood clot perspective, how long she should be on blood thinners would be defined after repeat protein S level. Patient may have cardiac indications for long term blood thinners. Continue to follow with cardiology regularly.   3. Hypercalcemia with calcium level of 11.3 mg/dL -She was found to have an incidentally elevated calcium level, which could be from being dehydrated with diuretics. Would recommend to recheck levels with PCP in the next week. If levels continue to be elevated, would recommend patient to have hypercalcemia workup either with [place] or PCP.   4. Abnormal liver function test  -CMP showed persistently elevated bilirubin which is suggestive of chronic liver disease. This would sometimes explain slightly low  protein S levels -Recommend patient to follow with PCP for further evaluation of abnormal liver function. I suspect that she has chronic passive venous congestion of the liver related to heart issues.  -recommend patient to follow with liver specialist to evaluate liver  disease further  -We shall see her back in 4 months with labs  FOLLOW-UP: RTC with Dr Candise Che with labs in 4 months  The total time spent in the appointment was 30 minutes* .  All of the patient's questions were answered with apparent satisfaction. The patient knows to call the clinic with any problems, questions or concerns.   Wyvonnia Lora MD MS AAHIVMS [facility] Hematology/Oncology Physician [facility]  .*Total Encounter Time as defined by the Centers for Medicare and Medicaid Services includes, in addition to the face-to-face time of a patient visit (documented in the note above) non-face-to-face time: obtaining and reviewing outside history, ordering and reviewing medications, tests or procedures, care coordination (communications with other health care professionals or caregivers) and documentation in the medical record.    I,Mitra Faeizi,acting as a Neurosurgeon for Wyvonnia Lora, MD.,have documented all relevant documentation on the behalf of Wyvonnia Lora, MD,as directed by  Wyvonnia Lora, MD while in the presence of Wyvonnia Lora, MD.  .I have reviewed the above documentation for accuracy and completeness, and I agree with the above. Johney Maine MD

## 2023-09-06 ENCOUNTER — Telehealth: Payer: Self-pay | Admitting: Hematology

## 2023-09-06 NOTE — Telephone Encounter (Signed)
Patient could not scheduled appointment due to time constrictions, will call back in a few days to follow up on appointment with patient

## 2023-09-12 ENCOUNTER — Telehealth: Payer: Self-pay | Admitting: Hematology

## 2023-09-12 NOTE — Telephone Encounter (Signed)
Scheduled appointments per referral. Patient is aware of the appointment time and date as well as the address. Patient was informed to arrive 10-15 minutes prior with updated insurance information. All questions were answered.

## 2023-09-24 NOTE — Progress Notes (Unsigned)
Cardiology Office Note:  .   Date:  09/26/2023  ID:  Carrie Guzman, DOB 07/01/1946, MRN [id number] PCP: Melida Quitter, MD  [facility] Providers Cardiologist:  Armanda Magic, MD    History of Present Illness: Carrie Guzman Kitchen   Carrie Guzman is a 77 y.o. female ( patient of Dr. Mayford Knife) withhx of CLL, obesity, pulmonary HTN, leg edema   She has markedly elevated RVSP She is scheduled for right heart cath with Dr. Elwyn Lade   She has been seen by the CHF team and is scheduled for cath on Dec. 3, 2024  Stopped smoking in 1973  ( started at age 9, quit at 58)    ROS:    Studies Reviewed: Carrie Guzman Kitchen   EKG Interpretation Date/Time:  Tuesday September 26 2023 16:45:38 EST Ventricular Rate:  76 PR Interval:  154 QRS Duration:  162 QT Interval:  418 QTC Calculation: 470 R Axis:   92  Text Interpretation: Sinus rhythm with Premature atrial complexes Right bundle branch block T wave abnormality, consider inferolateral ischemia When compared with ECG of 21-Jun-2023 11:47, No significant change since last tracing Confirmed by Kristeen Miss [phone]) on 09/26/2023 5:11:59 PM   EKG Interpretation Date/Time:  Tuesday September 26 2023 16:45:38 EST Ventricular Rate:  76 PR Interval:  154 QRS Duration:  162 QT Interval:  418 QTC Calculation: 470 R Axis:   92  Text Interpretation: Sinus rhythm with Premature atrial complexes Right bundle branch block T wave abnormality, consider inferolateral ischemia When compared with ECG of 21-Jun-2023 11:47, No significant change since last tracing Confirmed by Kristeen Miss ([id number]) on 09/26/2023 5:11:59 PM    Risk Assessment/Calculations:             Physical Exam:   VS:  BP 122/80   Pulse 79   Ht 5\' 4"  (1.626 m)   Wt 212 lb 9.6 oz (96.4 kg)   SpO2 96%   BMI 36.49 kg/m    Wt Readings from Last 3 Encounters:  09/26/23 212 lb 9.6 oz (96.4 kg)  08/02/23 206 lb (93.4 kg)  08/01/23 207 lb 8 oz (94.1 kg)    GEN: Well nourished, well developed in no  acute distress NECK: No JVD; No carotid bruits CARDIAC: RRR,  soft systolic murmur   RESPIRATORY:  Clear to auscultation without rales, wheezing or rhonchi  ABDOMEN: Soft, non-tender, non-distended EXTREMITIES:  3-4+ leg edema ; No deformity   ASSESSMENT AND PLAN: .   1.  Pulmonary hypertension: Carrie Guzman presents for further evaluation of her pulmonary hypertension.  She is scheduled for right heart cath with Dr. Elwyn Lade on December 3.  I agree with the plans for heart catheterization.  Carrie Guzman will hold her Lasix the morning of the cath.  I have checked in with Dr. Elwyn Lade and he is okay with continuing Eliquis.  We will not discontinue or hold Eliquis during this procedure.  We have discussed the risk, benefits, options of right heart catheterization.  She understands and agrees to proceed.  She will follow-up with Dr. Elwyn Lade for further recommendations.    Informed Consent   Shared Decision Making/Informed Consent{   The risks [stroke (1 in 1000), death (1 in 1000), kidney failure [usually temporary] (1 in 500), bleeding (1 in 200), allergic reaction [possibly serious] (1 in 200)], benefits (diagnostic support and management of coronary artery disease) and alternatives of a cardiac catheterization were discussed in detail with Carrie Guzman and she is willing to proceed.     Dispo: with  Dr. Elwyn Lade   Signed, Kristeen Miss, MD

## 2023-09-24 NOTE — H&P (View-Only) (Signed)
 Cardiology Office Note:  .   Date:  09/26/2023  ID:  Carrie Guzman, DOB 07/01/1946, MRN [id number] PCP: Melida Quitter, MD  [facility] Providers Cardiologist:  Armanda Magic, MD    History of Present Illness: Carrie Guzman Kitchen   Carrie Guzman is a 77 y.o. female ( patient of Dr. Mayford Knife) withhx of CLL, obesity, pulmonary HTN, leg edema   She has markedly elevated RVSP She is scheduled for right heart cath with Dr. Elwyn Lade   She has been seen by the CHF team and is scheduled for cath on Dec. 3, 2024  Stopped smoking in 1973  ( started at age 9, quit at 58)    ROS:    Studies Reviewed: Carrie Guzman Kitchen   EKG Interpretation Date/Time:  Tuesday September 26 2023 16:45:38 EST Ventricular Rate:  76 PR Interval:  154 QRS Duration:  162 QT Interval:  418 QTC Calculation: 470 R Axis:   92  Text Interpretation: Sinus rhythm with Premature atrial complexes Right bundle branch block T wave abnormality, consider inferolateral ischemia When compared with ECG of 21-Jun-2023 11:47, No significant change since last tracing Confirmed by Kristeen Miss [phone]) on 09/26/2023 5:11:59 PM   EKG Interpretation Date/Time:  Tuesday September 26 2023 16:45:38 EST Ventricular Rate:  76 PR Interval:  154 QRS Duration:  162 QT Interval:  418 QTC Calculation: 470 R Axis:   92  Text Interpretation: Sinus rhythm with Premature atrial complexes Right bundle branch block T wave abnormality, consider inferolateral ischemia When compared with ECG of 21-Jun-2023 11:47, No significant change since last tracing Confirmed by Kristeen Miss ([id number]) on 09/26/2023 5:11:59 PM    Risk Assessment/Calculations:             Physical Exam:   VS:  BP 122/80   Pulse 79   Ht 5\' 4"  (1.626 m)   Wt 212 lb 9.6 oz (96.4 kg)   SpO2 96%   BMI 36.49 kg/m    Wt Readings from Last 3 Encounters:  09/26/23 212 lb 9.6 oz (96.4 kg)  08/02/23 206 lb (93.4 kg)  08/01/23 207 lb 8 oz (94.1 kg)    GEN: Well nourished, well developed in no  acute distress NECK: No JVD; No carotid bruits CARDIAC: RRR,  soft systolic murmur   RESPIRATORY:  Clear to auscultation without rales, wheezing or rhonchi  ABDOMEN: Soft, non-tender, non-distended EXTREMITIES:  3-4+ leg edema ; No deformity   ASSESSMENT AND PLAN: .   1.  Pulmonary hypertension: Carrie Guzman presents for further evaluation of her pulmonary hypertension.  She is scheduled for right heart cath with Dr. Elwyn Lade on December 3.  I agree with the plans for heart catheterization.  Carrie Guzman will hold her Lasix the morning of the cath.  I have checked in with Dr. Elwyn Lade and he is okay with continuing Eliquis.  We will not discontinue or hold Eliquis during this procedure.  We have discussed the risk, benefits, options of right heart catheterization.  She understands and agrees to proceed.  She will follow-up with Dr. Elwyn Lade for further recommendations.    Informed Consent   Shared Decision Making/Informed Consent{   The risks [stroke (1 in 1000), death (1 in 1000), kidney failure [usually temporary] (1 in 500), bleeding (1 in 200), allergic reaction [possibly serious] (1 in 200)], benefits (diagnostic support and management of coronary artery disease) and alternatives of a cardiac catheterization were discussed in detail with Carrie Guzman and she is willing to proceed.     Dispo: with  Dr. Elwyn Lade   Signed, Kristeen Miss, MD

## 2023-09-26 ENCOUNTER — Ambulatory Visit: Payer: Medicare Other | Attending: Cardiovascular Disease | Admitting: Cardiovascular Disease

## 2023-09-26 ENCOUNTER — Encounter: Payer: Self-pay | Admitting: Cardiovascular Disease

## 2023-09-26 VITALS — BP 122/80 | HR 79 | Ht 64.0 in | Wt 212.6 lb

## 2023-09-26 DIAGNOSIS — Z01810 Encounter for preprocedural cardiovascular examination: Secondary | ICD-10-CM | POA: Insufficient documentation

## 2023-09-26 NOTE — Patient Instructions (Addendum)
Lab Work: CBC, BMET today If you have labs (blood work) drawn today and your tests are completely normal, you will receive your results only by: MyChart Message (if you have MyChart) OR A paper copy in the mail If you have any lab test that is abnormal or we need to change your treatment, we will call you to review the results.  Testing/Procedures: R heart catheterization (as scheduled) Your physician has requested that you have a cardiac catheterization. Cardiac catheterization is used to diagnose and/or treat various heart conditions. Doctors may recommend this procedure for a number of different reasons. The most common reason is to evaluate chest pain. Chest pain can be a symptom of coronary artery disease (CAD), and cardiac catheterization can show whether plaque is narrowing or blocking your heart's arteries. This procedure is also used to evaluate the valves, as well as measure the blood flow and oxygen levels in different parts of your heart. For further information please visit [email or web address] Please follow instruction sheet, as given.  Follow-Up: At [facility], you and your health needs are our priority.  As part of our continuing mission to provide you with exceptional heart care, we have created designated Provider Care Teams.  These Care Teams include your primary Cardiologist (physician) and Advanced Practice Providers (APPs -  Physician Assistants and Nurse Practitioners) who all work together to provide you with the care you need, when you need it.  Your next appointment:   As planned  Provider:   Armanda Magic, MD       Other Instructions Continue Eliquis as directed by Dr Elwyn Lade, Hold Furosemide the day of

## 2023-09-27 LAB — CBC
Hematocrit: 39.7 % (ref 34.0–46.6)
Hemoglobin: 12.9 g/dL (ref 11.1–15.9)
MCH: 32.3 pg (ref 26.6–33.0)
MCHC: 32.5 g/dL (ref 31.5–35.7)
MCV: 100 fL — ABNORMAL HIGH (ref 79–97)
Platelets: 167 10*3/uL (ref 150–450)
RBC: 3.99 x10E6/uL (ref 3.77–5.28)
RDW: 12.2 % (ref 11.7–15.4)
WBC: 6.8 10*3/uL (ref 3.4–10.8)

## 2023-09-27 LAB — BASIC METABOLIC PANEL
BUN/Creatinine Ratio: 20 (ref 12–28)
BUN: 14 mg/dL (ref 8–27)
CO2: 26 mmol/L (ref 20–29)
Calcium: 11 mg/dL — ABNORMAL HIGH (ref 8.7–10.3)
Chloride: 102 mmol/L (ref 96–106)
Creatinine, Ser: 0.71 mg/dL (ref 0.57–1.00)
Glucose: 105 mg/dL — ABNORMAL HIGH (ref 70–99)
Potassium: 3.8 mmol/L (ref 3.5–5.2)
Sodium: 144 mmol/L (ref 134–144)
eGFR: 88 mL/min/{1.73_m2} (ref >59)

## 2023-10-02 ENCOUNTER — Ambulatory Visit: Payer: Medicare Other

## 2023-10-02 ENCOUNTER — Encounter: Payer: Self-pay | Admitting: Pulmonary Disease

## 2023-10-02 ENCOUNTER — Ambulatory Visit: Payer: Medicare Other | Admitting: Pulmonary Disease

## 2023-10-02 VITALS — BP 134/75 | HR 71 | Temp 97.9°F | Ht 64.0 in | Wt 213.0 lb

## 2023-10-02 DIAGNOSIS — I272 Pulmonary hypertension, unspecified: Secondary | ICD-10-CM

## 2023-10-02 DIAGNOSIS — R0600 Dyspnea, unspecified: Secondary | ICD-10-CM

## 2023-10-02 DIAGNOSIS — R0902 Hypoxemia: Secondary | ICD-10-CM | POA: Diagnosis not present

## 2023-10-02 NOTE — Patient Instructions (Signed)
Your oxygen stayed where needed to be on room air -It means when you are sitting around you can leave the oxygen off  Continue to check your pulse ox to make sure your oxygen stays above 90% whenever you are doing other activities  Continue to use your oxygen at night  Chest x-ray to be done today  Make sure you follow-up with the sleep study -A WatchPAT is the one that does not need the oxygen tube around the nose -This is the more appropriate 1  Continue your blood thinners  Tentative follow-up in about 6 to 8 weeks  Continue graded activities as tolerated  Call us with significant concerns

## 2023-10-02 NOTE — Progress Notes (Signed)
Carrie Guzman    [id number]    04-May-1946  Primary Care Physician:Wile, Nyoka Cowden, MD  Referring Physician: Melida Quitter, MD [address] [place],  [place] [address]  Chief complaint:   Patient with recent PE, pulmonary hypertension  HPI:  Treated for pulmonary embolism in the hospital On blood thinners which she is tolerating well at present  History of pulmonary hypertension  Has been participating in physical therapy which she is tolerating better, feels stronger than when she went into the hospital  Unsure whether she snores, already scheduled for a home sleep test -She is not sure what machine she has at home encouraged her to make sure she checks on it and have the study done as soon as possible to ascertain whether there is significant sleep disordered breathing  She is on oxygen during the day which we were able to discontinue at rest today She still needs to continue using oxygen at night  Encouraged to continue her blood thinners as well  Past history of smoking quit in 1973 Was initially a social smoker smoked about a pack a day for about 8 years  History of chronic lower extremity lymphedema, hypertension, morbid obesity  Was evaluated for a pulmonary embolism following increasing complaints of shortness of breath - diagnosed with pulmonary embolism with right heart strain  Outpatient Encounter Medications as of 10/02/2023  Medication Sig   acetaminophen (TYLENOL) 650 MG CR tablet Take 1,300 mg by mouth as needed for pain.   apixaban (ELIQUIS) 5 MG TABS tablet Take 1 tablet (5 mg total) by mouth 2 (two) times daily.   furosemide (LASIX) 40 MG tablet Take 40 mg by mouth daily.   losartan (COZAAR) 25 MG tablet Take 0.5 tablets (12.5 mg total) by mouth at bedtime.   Menthol, Topical Analgesic, (BIOFREEZE EX) Apply 1 Application topically daily. As needed   metoprolol succinate (TOPROL XL) 25 MG 24 hr tablet Take 1 tablet (25 mg total) by mouth daily.    Multiple Vitamins-Minerals (CENTRUM SILVER 50+WOMEN) TABS Take 1 tablet by mouth daily.   OVER THE COUNTER MEDICATION Take 1 tablet by mouth daily at 2 am. smooth move   potassium chloride (KLOR-CON) 10 MEQ tablet Take 10 mEq by mouth daily.   rosuvastatin (CRESTOR) 10 MG tablet Take 10 mg by mouth daily.   triamcinolone (KENALOG) 0.025 % ointment Apply 1 Application topically daily.   No facility-administered encounter medications on file as of 10/02/2023.    Allergies as of 10/02/2023   (No Known Allergies)    Past Medical History:  Diagnosis Date   CHF (congestive heart failure) (HCC)    Elevated brain natriuretic peptide (BNP) level    Elevated cholesterol    Heart murmur    HLD (hyperlipidemia)    Hypertension    Lower extremity cellulitis    Lymphedema of lower extremity    Psoriasis    Squamous cell carcinoma of skin 05/12/2021   Left Lower Leg - anterior (in situ) (curet and 5FU)    Past Surgical History:  Procedure Laterality Date   ABDOMINAL HERNIA REPAIR     ABDOMINAL HYSTERECTOMY     ANKLE FRACTURE SURGERY     CATARACT EXTRACTION     CHOLECYSTECTOMY      Family History  Problem Relation Age of Onset   Hypertension Father    Heart attack Father    Psoriasis Father    Diabetes Maternal Grandfather  Social History   Socioeconomic History   Marital status: Married    Spouse name: Not on file   Number of children: Not on file   Years of education: Not on file   Highest education level: Not on file  Occupational History   Not on file  Tobacco Use   Smoking status: Former    Types: Cigarettes    Passive exposure: Never   Smokeless tobacco: Never  Vaping Use   Vaping status: Never Used  Substance and Sexual Activity   Alcohol use: Never   Drug use: Never   Sexual activity: Not on file  Other Topics Concern   Not on file  Social History Narrative   Not on file   Social Determinants of Health   Financial Resource Strain: Not on file   Food Insecurity: No Food Insecurity (06/21/2023)   Hunger Vital Sign    Worried About Running Out of Food in the Last Year: Never true    Ran Out of Food in the Last Year: Never true  Transportation Needs: No Transportation Needs (06/21/2023)   PRAPARE - Administrator, Civil Service (Medical): No    Lack of Transportation (Non-Medical): No  Physical Activity: Not on file  Stress: Not on file  Social Connections: Not on file  Intimate Partner Violence: Not At Risk (06/21/2023)   Humiliation, Afraid, Rape, and Kick questionnaire    Fear of Current or Ex-Partner: No    Emotionally Abused: No    Physically Abused: No    Sexually Abused: No    Review of Systems  Constitutional:  Negative for fatigue.  Respiratory:  Positive for shortness of breath.   Cardiovascular:  Positive for leg swelling.  Psychiatric/Behavioral:  Negative for sleep disturbance.     Vitals:   10/02/23 1345  BP: 134/75  Pulse: 71  Temp: 97.9 F (36.6 C)  SpO2: 96%     Physical Exam Constitutional:      Appearance: She is obese.  HENT:     Head: Normocephalic.     Mouth/Throat:     Mouth: Mucous membranes are moist.  Eyes:     General: No scleral icterus. Cardiovascular:     Rate and Rhythm: Normal rate and regular rhythm.     Heart sounds: No murmur heard.    No friction rub.  Pulmonary:     Effort: No respiratory distress.     Breath sounds: No stridor. No wheezing or rhonchi.  Musculoskeletal:     Cervical back: No rigidity or tenderness.  Neurological:     Mental Status: She is alert.  Psychiatric:        Mood and Affect: Mood normal.     Data Reviewed: Recent hospital records reviewed  CT scan of the chest reviewed  Echocardiogram reviewed showing grade 1 diastolic dysfunction, severely reduced right ventricular function, severely dilated right atrial size with severe pulmonary hypertension  Assessment:  Recent pulmonary embolism  Severe pulmonary  hypertension  Concern for sleep apnea for which she has a sleep study scheduled -She does have the device at home just has not done the study yet  Deconditioning -Continues to participate in rehab and is getting stronger  Hypoxemia -Oxygen saturations was 95% on room air in the office today  Plan/Recommendations: Continue oxygen supplementation at night  Follow-up with sleep study  Continue anticoagulation  Follow-up with cardiac catheterization which is scheduled for sometime in December  The importance of making sure she does not have significant  sleep disordered breathing and optimizing treatment was discussed in the context of severe pulmonary hypertension  Follow-up in 6 to 8 weeks  Obtain a chest x-ray today to ascertain resolution of recent pleural effusion associated with pulmonary embolism  I reviewed CXR today showed resolution of pleural effusion  Virl Diamond MD [facility] 10/02/2023, 2:13 PM  CC: Melida Quitter, MD

## 2023-10-06 ENCOUNTER — Telehealth: Payer: Self-pay | Admitting: Cardiology

## 2023-10-06 MED ORDER — APIXABAN 5 MG PO TABS: 5.0000 mg | ORAL_TABLET | Freq: Two times a day (BID) | ORAL | 1 refills | Status: DC

## 2023-10-06 MED ORDER — APIXABAN 5 MG PO TABS: 5.0000 mg | ORAL_TABLET | Freq: Two times a day (BID) | ORAL | 2 refills | Status: DC

## 2023-10-06 NOTE — Telephone Encounter (Signed)
   Patient requesting refill of Eliquis. She was started on this following PE diagnosis this past August. As patient only has 2 doses of medication remaining, will refill to ensure no lapses in therapy. Cardiology will defer further refills to patient's pulmonologist, Dr. Wynona Neat.   Perlie Gold, PA-C

## 2023-10-08 ENCOUNTER — Other Ambulatory Visit: Payer: Self-pay | Admitting: Physician Assistant

## 2023-10-09 ENCOUNTER — Telehealth (HOSPITAL_COMMUNITY): Payer: Self-pay

## 2023-10-09 NOTE — Telephone Encounter (Signed)
Spoke to patient regarding procedure scheduled for tomorrow. Reviewed place and time. Aware of nothing to eat or drink after midnight. Arrive of holding lasix in the morning.has transportation to and from procedure.

## 2023-10-10 ENCOUNTER — Encounter (HOSPITAL_COMMUNITY)
Admission: RE | Disposition: A | Discharge status: Home / Self Care | Payer: Medicare Other | Source: Home / Self Care | Attending: Cardiology

## 2023-10-10 ENCOUNTER — Other Ambulatory Visit: Payer: Self-pay

## 2023-10-10 ENCOUNTER — Ambulatory Visit (HOSPITAL_COMMUNITY)
Admission: RE | Admit: 2023-10-10 | Discharge: 2023-10-10 | Disposition: A | Discharge status: Home / Self Care | Payer: Medicare Other | Attending: Cardiology | Admitting: Cardiology

## 2023-10-10 DIAGNOSIS — Z6836 Body mass index (BMI) 36.0-36.9, adult: Secondary | ICD-10-CM | POA: Insufficient documentation

## 2023-10-10 DIAGNOSIS — Z87891 Personal history of nicotine dependence: Secondary | ICD-10-CM | POA: Diagnosis not present

## 2023-10-10 DIAGNOSIS — C9110 Chronic lymphocytic leukemia of B-cell type not having achieved remission: Secondary | ICD-10-CM | POA: Diagnosis not present

## 2023-10-10 DIAGNOSIS — E669 Obesity, unspecified: Secondary | ICD-10-CM | POA: Insufficient documentation

## 2023-10-10 DIAGNOSIS — I272 Pulmonary hypertension, unspecified: Secondary | ICD-10-CM | POA: Diagnosis present

## 2023-10-10 HISTORY — PX: RIGHT HEART CATH: CATH118263

## 2023-10-10 LAB — POCT I-STAT EG7
Acid-Base Excess: 5 mmol/L — ABNORMAL HIGH (ref 0.0–2.0)
Acid-Base Excess: 5 mmol/L — ABNORMAL HIGH (ref 0.0–2.0)
Acid-Base Excess: 6 mmol/L — ABNORMAL HIGH (ref 0.0–2.0)
Bicarbonate: 30.9 mmol/L — ABNORMAL HIGH (ref 20.0–28.0)
Bicarbonate: 30.9 mmol/L — ABNORMAL HIGH (ref 20.0–28.0)
Bicarbonate: 30.9 mmol/L — ABNORMAL HIGH (ref 20.0–28.0)
Calcium, Ion: 1.43 mmol/L — ABNORMAL HIGH (ref 1.15–1.40)
Calcium, Ion: 1.43 mmol/L — ABNORMAL HIGH (ref 1.15–1.40)
Calcium, Ion: 1.43 mmol/L — ABNORMAL HIGH (ref 1.15–1.40)
HCT: 33 % — ABNORMAL LOW (ref 36.0–46.0)
HCT: 33 % — ABNORMAL LOW (ref 36.0–46.0)
HCT: 34 % — ABNORMAL LOW (ref 36.0–46.0)
Hemoglobin: 11.2 g/dL — ABNORMAL LOW (ref 12.0–15.0)
Hemoglobin: 11.2 g/dL — ABNORMAL LOW (ref 12.0–15.0)
Hemoglobin: 11.6 g/dL — ABNORMAL LOW (ref 12.0–15.0)
O2 Saturation: 68 %
O2 Saturation: 73 %
O2 Saturation: 75 %
Potassium: 3.1 mmol/L — ABNORMAL LOW (ref 3.5–5.1)
Potassium: 3.1 mmol/L — ABNORMAL LOW (ref 3.5–5.1)
Potassium: 3.1 mmol/L — ABNORMAL LOW (ref 3.5–5.1)
Sodium: 143 mmol/L (ref 135–145)
Sodium: 143 mmol/L (ref 135–145)
Sodium: 143 mmol/L (ref 135–145)
TCO2: 32 mmol/L (ref 22–32)
TCO2: 32 mmol/L (ref 22–32)
TCO2: 32 mmol/L (ref 22–32)
pCO2, Ven: 47.8 mm[Hg] (ref 44–60)
pCO2, Ven: 47.9 mm[Hg] (ref 44–60)
pCO2, Ven: 48.4 mm[Hg] (ref 44–60)
pH, Ven: 7.413 (ref 7.25–7.43)
pH, Ven: 7.417 (ref 7.25–7.43)
pH, Ven: 7.419 (ref 7.25–7.43)
pO2, Ven: 36 mm[Hg] (ref 32–45)
pO2, Ven: 39 mm[Hg] (ref 32–45)
pO2, Ven: 40 mm[Hg] (ref 32–45)

## 2023-10-10 SURGERY — RIGHT HEART CATH
Anesthesia: LOCAL

## 2023-10-10 MED ORDER — LOSARTAN POTASSIUM 25 MG PO TABS: 25.0000 mg | ORAL_TABLET | Freq: Every day | ORAL | 11 refills | Status: DC

## 2023-10-10 MED ORDER — SODIUM CHLORIDE 0.9 % IV SOLN: INTRAVENOUS | Status: DC

## 2023-10-10 MED ORDER — LIDOCAINE HCL (PF) 1 % IJ SOLN
INTRAMUSCULAR | Status: DC | PRN
  Administered 2023-10-10: 1 mL

## 2023-10-10 MED ORDER — HEPARIN (PORCINE) IN NACL 1000-0.9 UT/500ML-% IV SOLN
INTRAVENOUS | Status: DC | PRN
  Administered 2023-10-10: 500 mL

## 2023-10-10 MED ORDER — LIDOCAINE HCL (PF) 1 % IJ SOLN
INTRAMUSCULAR | Status: AC
  Filled 2023-10-10: qty 30

## 2023-10-10 SURGICAL SUPPLY — 5 items
CATH BALLN WEDGE 5F 110CM (CATHETERS) IMPLANT
PACK CARDIAC CATHETERIZATION (CUSTOM PROCEDURE TRAY) ×1 IMPLANT
SHEATH GLIDE SLENDER 4/5FR (SHEATH) IMPLANT
TRANSDUCER W/STOPCOCK (MISCELLANEOUS) IMPLANT
TUBING ART PRESS 72 MALE/FEM (TUBING) IMPLANT

## 2023-10-10 NOTE — Interval H&P Note (Signed)
History and Physical Interval Note:  10/10/2023 12:13 PM  Carrie Guzman  has presented today for surgery, with the diagnosis of pulmonary htn.  The various methods of treatment have been discussed with the patient and family. After consideration of risks, benefits and other options for treatment, the patient has consented to  Procedure(s): RIGHT HEART CATH (N/A) as a surgical intervention.  The patient's history has been reviewed, patient examined, no change in status, stable for surgery.  I have reviewed the patient's chart and labs.  Questions were answered to the patient's satisfaction.     Romie Minus

## 2023-10-10 NOTE — Discharge Instructions (Signed)

## 2023-10-11 ENCOUNTER — Encounter (HOSPITAL_COMMUNITY): Payer: Self-pay | Admitting: Cardiology

## 2023-10-15 ENCOUNTER — Encounter (INDEPENDENT_AMBULATORY_CARE_PROVIDER_SITE_OTHER): Payer: Medicare Other | Admitting: Cardiology

## 2023-10-15 DIAGNOSIS — G4733 Obstructive sleep apnea (adult) (pediatric): Secondary | ICD-10-CM

## 2023-10-15 DIAGNOSIS — R0683 Snoring: Secondary | ICD-10-CM

## 2023-10-26 ENCOUNTER — Ambulatory Visit
Admission: RE | Admit: 2023-10-26 | Discharge: 2023-10-26 | Disposition: A | Discharge status: Home / Self Care | Payer: Medicare Other | Source: Ambulatory Visit | Attending: Internal Medicine | Admitting: Internal Medicine

## 2023-10-26 DIAGNOSIS — N2889 Other specified disorders of kidney and ureter: Secondary | ICD-10-CM

## 2023-10-26 MED ORDER — GADOPICLENOL 0.5 MMOL/ML IV SOLN
10.0000 mL | Freq: Once | INTRAVENOUS | Status: AC | PRN
  Administered 2023-10-26: 10 mL via INTRAVENOUS

## 2023-10-31 ENCOUNTER — Ambulatory Visit: Payer: Medicare Other | Attending: Cardiology

## 2023-10-31 DIAGNOSIS — G4733 Obstructive sleep apnea (adult) (pediatric): Secondary | ICD-10-CM

## 2023-10-31 NOTE — Procedures (Signed)
   SLEEP STUDY REPORT Patient Information Study Date: 10/15/2023 Patient Name: Carrie Guzman Patient ID: [id number] Birth Date: 14-Jul-1946 Age: 77 Gender: Female BMI: 35.4 (W=207 lb, H=5' 4'') Stopbang: 3 Referring Physician: Elie Confer, MD  TEST DESCRIPTION: Home sleep apnea testing was completed using the WatchPat, a Type 1 device, utilizing  peripheral arterial tonometry (PAT), chest movement, actigraphy, pulse oximetry, pulse rate, body position and snore.  AHI was calculated with apnea and hypopnea using valid sleep time as the denominator. RDI includes apneas,  hypopneas, and RERAs. The data acquired and the scoring of sleep and all associated events were performed in  accordance with the recommended standards and specifications as outlined in the AASM Manual for the Scoring of  Sleep and Associated Events 2.2.0 (2015).   FINDINGS: 1. No evidence of Obstructive Sleep Apnea with AHI 2.7/hr.  2. No Central Sleep Apnea. 3. Oxygen desaturations as low as 86%. 4. Mild snoring was present. O2 sats were < 88% for 0 minutes. 5. Total sleep time was 8 hrs and 10 min. 6. 25.1% of total sleep time was spent in REM sleep.  7. sleep onset latency at 6 min.  8. REM sleep onset latency at 50 min.  9. Total awakenings were 0.  10. Arrhythmia Detection: Possible atrial fibrillation lasting 2 minutes and 17 seconds. This is not diagnostic and  further testing recommended if clnically indicated.  DIAGNOSIS:  Normal study with no significant sleep disordered breathing. Possible atrial fibrillation.  RECOMMENDATIONS: 1. Normal study with no significant sleep disordered breathing.  2. Healthy sleep recommendations include: adequate nightly sleep (normal 7-9 hrs/night), avoidance of caffeine after  noon and alcohol near bedtime, and maintaining a sleep environment that is cool, dark and quiet.  3. Weight loss for overweight patients is recommended.   4. Snoring recommendations  include: weight loss where appropriate, side sleeping, and avoidance of alcohol before  bed.  5. Operation of motor vehicle or dangerous equipment must be avoided when feeling drowsy, excessively sleepy, or  mentally fatigued.   6. An ENT consultation which may be useful for specific causes of and possible treatment of bothersome snoring .   7. Weight loss may be of benefit in reducing the severity of snoring.   Signature: Armanda Magic, MD; [facility]; Diplomat, American Board of Sleep  Medicine Electronically Signed: 10/31/2023 5:20:28 PM

## 2023-11-03 ENCOUNTER — Ambulatory Visit (HOSPITAL_COMMUNITY)
Admission: RE | Admit: 2023-11-03 | Discharge: 2023-11-03 | Disposition: A | Discharge status: Home / Self Care | Payer: Medicare Other | Source: Ambulatory Visit | Attending: Cardiology | Admitting: Cardiology

## 2023-11-03 ENCOUNTER — Encounter (HOSPITAL_COMMUNITY): Payer: Self-pay | Admitting: Cardiology

## 2023-11-03 ENCOUNTER — Inpatient Hospital Stay (HOSPITAL_COMMUNITY): Admission: RE | Admit: 2023-11-03 | Payer: Medicare Other | Source: Ambulatory Visit

## 2023-11-03 VITALS — BP 158/62 | HR 82 | Ht 64.0 in | Wt 213.4 lb

## 2023-11-03 DIAGNOSIS — I5032 Chronic diastolic (congestive) heart failure: Secondary | ICD-10-CM

## 2023-11-03 DIAGNOSIS — I272 Pulmonary hypertension, unspecified: Secondary | ICD-10-CM | POA: Diagnosis not present

## 2023-11-03 DIAGNOSIS — I890 Lymphedema, not elsewhere classified: Secondary | ICD-10-CM | POA: Insufficient documentation

## 2023-11-03 DIAGNOSIS — I3139 Other pericardial effusion (noninflammatory): Secondary | ICD-10-CM | POA: Diagnosis present

## 2023-11-03 DIAGNOSIS — D696 Thrombocytopenia, unspecified: Secondary | ICD-10-CM | POA: Insufficient documentation

## 2023-11-03 LAB — BASIC METABOLIC PANEL
Anion gap: 7 (ref 5–15)
BUN: 12 mg/dL (ref 8–23)
CO2: 29 mmol/L (ref 22–32)
Calcium: 10.7 mg/dL — ABNORMAL HIGH (ref 8.9–10.3)
Chloride: 106 mmol/L (ref 98–111)
Creatinine, Ser: 0.72 mg/dL (ref 0.44–1.00)
GFR, Estimated: >60 mL/min (ref >60)
Glucose, Bld: 105 mg/dL — ABNORMAL HIGH (ref 70–99)
Potassium: 3.5 mmol/L (ref 3.5–5.1)
Sodium: 142 mmol/L (ref 135–145)

## 2023-11-03 LAB — BRAIN NATRIURETIC PEPTIDE: B Natriuretic Peptide: 534.7 pg/mL — ABNORMAL HIGH (ref 0.0–100.0)

## 2023-11-03 MED ORDER — SILDENAFIL CITRATE 20 MG PO TABS: 20.0000 mg | ORAL_TABLET | Freq: Three times a day (TID) | ORAL | 3 refills | Status: DC

## 2023-11-03 NOTE — Progress Notes (Signed)
   PULMONARY HYPERTENSION FOLLOW UP CLINIC NOTE  Referring Physician: Melida Quitter, MD  Primary Care: Melida Quitter, MD Primary Cardiologist:  HPI: Carrie Guzman is a 77 y.o. female with a PMH of lymphedema, thrombocytopenia, pericardial effusion who presents for follow up of pulmonary hypertension.      Patient had no previous cardiac history but was admitted to the hospital in August with concerns of shortness of breath ongoing for 2-3 weeks. Patient was diagnosed with CHF exacerbation. She had elevated BNP, total bilirubin was elevated. Upon admission started on diuretics. Echocardiogram showed preserved EF with elevated PASP, cor pulmonale and pericardial effusion without tamponade. CTA chest showed acute pulmonary embolism but right side heart strain, case was discussed with pulmonary who recommended heparin otherwise no indication for thrombectomy at this time. She was discharged with diuretics and seen in [facility] clinic for follow up.   She underwent RHC on 10/2023 that showed RA: 10, PA: 94/25 (48), PCWP 15, Fick CO/CI 6.27/3.16, PVR 5.27.        SUBJECTIVE:  Patient reports that she is feeling much improved since she was first seen in clinic. She  was able to go to the store and walk around for about 30 minutes, which his a significant improvement. Underwent sleep study without evidence of OSA. PFTs and V/Q scan are pending.   PMH, current medications, allergies, social history, and family history reviewed in epic.  PHYSICAL EXAM: Vitals:   11/03/23 0846  BP: (!) 158/62  Pulse: 82  SpO2: 94%   GENERAL: Symptomatically improved HEENT: MMM  Lungs- Diminished breath sounds, normal WOB CARDIAC:  JVP: Moderately elevated          Normal rate and rhythm, prominent P2, no systolic murmur.  ABDOMEN: Soft, non-tender, non-distended. Marland Kitchen  EXTREMITIES: Extensive lymphedema, improved from prior NEUROLOGIC: Patient is oriented x3 with no focal or lateralizing neurologic deficits.   PSYCH: Patients affect is appropriate, there is no evidence of anxiety or depression.  SKIN: Warm and dry; no lesions or wounds.   DATA REVIEW  ECG: Sinus rhythm, right bundle branch block, frequent PACs.    ECHO: Severely dilated RV, elevated RVSP, near complete obliteration of LV, underfilled.  CATH: 10/2023: RHC with RA: 10, PA 94/25 (48), PCWP 15, Fick CO/CI 6.27/3.16, PVR 5.27.  Etiology of PH: Workup ongoing WHO Functional class: III : at next visit Volume status:  Mildly hypervolemic Pulmonary vasodilators: Sildenafil  Work up: [employer] & Orders: Lab workup completed previously and normal Imaging: Needs V/Q scan Functional: PFTs pending Hemodynamics: RHC   ASSESSMENT & PLAN:  Pulmonary hypertension: RHC as above, patient with predominant precapillary PH with elevated PVR. Given her history of PE needs V/Q scan which is pending. No sleep apnea, and based on CT scan imaging does not have extensive pulmonary disease. Will start sildenafil, good blood pressure so should tolerate well. May not be a good ERA candidate with borderline wedge and lymphedema, but can consider riociguat based on V/Q scan. - Start sildenafil 20mg  TID - Continue current diuresis regimen, may need to be adjusted - Losartan 25mg  daily - Continue metoprolol 25mg  TID - Follow up in 1 months - V/Q scan/PFTs pending   [name], MD Advanced Heart Failure Mechanical Circulatory Support 11/03/23

## 2023-11-03 NOTE — Patient Instructions (Addendum)
Great to see you today!!!  START Sildenafil 20 mg Three times a day   Labs done today, your results will be available in MyChart, we will contact you for abnormal readings.  A VQ Scan and chest x-ray have been ordered. Once approved by your insurance you will be called to schedule this  Your physician recommends that you schedule a follow-up appointment in: 1 month  If you have any questions or concerns before your next appointment please send [place] a message through [place] or call our office at [phone].    TO LEAVE A MESSAGE FOR THE NURSE SELECT OPTION 2, PLEASE LEAVE A MESSAGE INCLUDING: YOUR NAME DATE OF BIRTH CALL BACK NUMBER REASON FOR CALL**this is important as we prioritize the call backs  YOU WILL RECEIVE A CALL BACK THE SAME DAY AS LONG AS YOU CALL BEFORE 4:00 PM  At the [facility], you and your health needs are our priority. As part of our continuing mission to provide you with exceptional heart care, we have created designated Provider Care Teams. These Care Teams include your primary Cardiologist (physician) and Advanced Practice Providers (APPs- Physician Assistants and Nurse Practitioners) who all work together to provide you with the care you need, when you need it.   You may see any of the following providers on your designated Care Team at your next follow up: Dr Arvilla Meres Dr Marca Ancona Dr. Dorthula Nettles Dr. [name] Amy Filbert Schilder, NP Robbie Lis, [place] [facility] [place], [place] Brynda Peon, NP [place] Lee, NP Karle Plumber, PharmD   Please be sure to bring in all your medications bottles to every appointment.    Thank you for choosing [facility]

## 2023-11-06 ENCOUNTER — Ambulatory Visit (HOSPITAL_COMMUNITY)
Admission: RE | Admit: 2023-11-06 | Discharge: 2023-11-06 | Disposition: A | Discharge status: Home / Self Care | Payer: Medicare Other | Source: Ambulatory Visit | Attending: Cardiology | Admitting: Cardiology

## 2023-11-06 DIAGNOSIS — I272 Pulmonary hypertension, unspecified: Secondary | ICD-10-CM

## 2023-11-06 LAB — PULMONARY FUNCTION TEST
DL/VA % pred: 89 %
DL/VA: 3.66 ml/min/mmHg/L
DLCO unc % pred: 57 %
DLCO unc: 10.95 ml/min/mmHg
FEF 25-75 Post: 1.96 L/s
FEF 25-75 Pre: 1.63 L/s
FEF2575-%Change-Post: 20 %
FEF2575-%Pred-Post: 121 %
FEF2575-%Pred-Pre: 101 %
FEV1-%Change-Post: 14 %
FEV1-%Pred-Post: 71 %
FEV1-%Pred-Pre: 62 %
FEV1-Post: 1.49 L
FEV1-Pre: 1.31 L
FEV1FVC-%Change-Post: -1 %
FEV1FVC-%Pred-Pre: 115 %
FEV6-%Change-Post: 16 %
FEV6-%Pred-Post: 67 %
FEV6-%Pred-Pre: 57 %
FEV6-Post: 1.77 L
FEV6-Pre: 1.52 L
FEV6FVC-%Pred-Post: 105 %
FEV6FVC-%Pred-Pre: 105 %
FVC-%Change-Post: 16 %
FVC-%Pred-Post: 63 %
FVC-%Pred-Pre: 54 %
FVC-Post: 1.77 L
FVC-Pre: 1.52 L
Post FEV1/FVC ratio: 84 %
Post FEV6/FVC ratio: 100 %
Pre FEV1/FVC ratio: 86 %
Pre FEV6/FVC Ratio: 100 %

## 2023-11-06 MED ORDER — ALBUTEROL SULFATE (2.5 MG/3ML) 0.083% IN NEBU
2.5000 mg | INHALATION_SOLUTION | Freq: Once | RESPIRATORY_TRACT | Status: AC
  Administered 2023-11-06: 2.5 mg via RESPIRATORY_TRACT

## 2023-11-07 ENCOUNTER — Other Ambulatory Visit: Payer: Self-pay

## 2023-11-07 ENCOUNTER — Telehealth: Payer: Self-pay | Admitting: *Deleted

## 2023-11-07 DIAGNOSIS — G4733 Obstructive sleep apnea (adult) (pediatric): Secondary | ICD-10-CM

## 2023-11-07 NOTE — Progress Notes (Signed)
Order placed for itamar, test already completed.

## 2023-11-07 NOTE — Telephone Encounter (Signed)
-----   Message from Armanda Magic sent at 10/31/2023  5:22 PM EST ----- Please let patient know that sleep study showed no significant sleep apnea.

## 2023-11-07 NOTE — Telephone Encounter (Signed)
 The patient has been notified of the result and verbalized understanding.  All questions (if any) were answered. Latrelle Dodrill, CMA 11/07/2023 5:29 PM     Pt is aware and agreeable to normal results.

## 2023-11-10 ENCOUNTER — Telehealth (HOSPITAL_COMMUNITY): Payer: Self-pay | Admitting: Cardiology

## 2023-11-10 NOTE — Telephone Encounter (Signed)
Returned callNo answer unable to leave message

## 2023-11-10 NOTE — Telephone Encounter (Signed)
 Patient called to report increased HR with activity   Reports she  completed PFT's  12/30 and woke up 1/1 with increase in SOB and elevated HR (144)  -HR has since returned (103)to normal and SOB improved some but she is not back to her base line  Declines CP,dizziness, recent med changes Home readings over the past few days ( 118/88 130/80 107/90   HR   73  69  80  77  93 107)   Please advise

## 2023-11-21 ENCOUNTER — Telehealth (HOSPITAL_COMMUNITY): Payer: Self-pay | Admitting: Cardiology

## 2023-11-21 NOTE — Telephone Encounter (Signed)
Pt aware.

## 2023-11-21 NOTE — Telephone Encounter (Signed)
 Patient called to report 10 lb weight gain x 4-5 days Increase in SOB (espically with acitivity) and minimal swelling  Reports lasix  40 mg daily Denies recent medication changes, CP  11/21/23  108/80 HR 90 Weight normally 205 Weight today 215   Increase in salt consumption last Friday  Reports fluid intake is less than 2L daily   Nurse visit scheduled 1/16 for EKG with concerns of increased HR    Please advise

## 2023-11-22 ENCOUNTER — Encounter (HOSPITAL_COMMUNITY)
Admission: RE | Admit: 2023-11-22 | Discharge: 2023-11-22 | Disposition: A | Discharge status: Home / Self Care | Payer: Medicare Other | Source: Ambulatory Visit | Attending: Cardiology | Admitting: Cardiology

## 2023-11-22 ENCOUNTER — Other Ambulatory Visit (HOSPITAL_COMMUNITY): Payer: Self-pay | Admitting: Cardiology

## 2023-11-22 ENCOUNTER — Ambulatory Visit (HOSPITAL_COMMUNITY)
Admission: RE | Admit: 2023-11-22 | Discharge: 2023-11-22 | Disposition: A | Discharge status: Home / Self Care | Payer: Medicare Other | Source: Ambulatory Visit | Attending: Cardiology | Admitting: Cardiology

## 2023-11-22 DIAGNOSIS — I272 Pulmonary hypertension, unspecified: Secondary | ICD-10-CM

## 2023-11-22 MED ORDER — TECHNETIUM TO 99M ALBUMIN AGGREGATED
3.8000 | Freq: Once | INTRAVENOUS | Status: AC | PRN
  Administered 2023-11-22: 3.8 via INTRAVENOUS

## 2023-11-22 NOTE — Telephone Encounter (Signed)
 Pt aware NV 1/16

## 2023-11-23 ENCOUNTER — Ambulatory Visit (HOSPITAL_COMMUNITY)
Admission: RE | Admit: 2023-11-23 | Discharge: 2023-11-23 | Disposition: A | Discharge status: Home / Self Care | Payer: Medicare Other | Source: Ambulatory Visit | Attending: Cardiology | Admitting: Cardiology

## 2023-11-23 VITALS — BP 120/80 | HR 104 | Resp 18 | Wt 221.8 lb

## 2023-11-23 DIAGNOSIS — I4891 Unspecified atrial fibrillation: Secondary | ICD-10-CM | POA: Insufficient documentation

## 2023-11-23 DIAGNOSIS — I110 Hypertensive heart disease with heart failure: Secondary | ICD-10-CM | POA: Diagnosis not present

## 2023-11-23 DIAGNOSIS — I50810 Right heart failure, unspecified: Secondary | ICD-10-CM | POA: Diagnosis not present

## 2023-11-23 DIAGNOSIS — I480 Paroxysmal atrial fibrillation: Secondary | ICD-10-CM

## 2023-11-23 DIAGNOSIS — Z7901 Long term (current) use of anticoagulants: Secondary | ICD-10-CM | POA: Insufficient documentation

## 2023-11-23 DIAGNOSIS — R0609 Other forms of dyspnea: Secondary | ICD-10-CM | POA: Diagnosis not present

## 2023-11-23 DIAGNOSIS — R000 Tachycardia, unspecified: Secondary | ICD-10-CM | POA: Diagnosis not present

## 2023-11-23 DIAGNOSIS — Z87891 Personal history of nicotine dependence: Secondary | ICD-10-CM | POA: Insufficient documentation

## 2023-11-23 DIAGNOSIS — Z79899 Other long term (current) drug therapy: Secondary | ICD-10-CM | POA: Diagnosis not present

## 2023-11-23 DIAGNOSIS — I3139 Other pericardial effusion (noninflammatory): Secondary | ICD-10-CM | POA: Diagnosis not present

## 2023-11-23 DIAGNOSIS — Z8249 Family history of ischemic heart disease and other diseases of the circulatory system: Secondary | ICD-10-CM | POA: Insufficient documentation

## 2023-11-23 DIAGNOSIS — I272 Pulmonary hypertension, unspecified: Secondary | ICD-10-CM | POA: Diagnosis not present

## 2023-11-23 DIAGNOSIS — R002 Palpitations: Secondary | ICD-10-CM | POA: Diagnosis present

## 2023-11-23 DIAGNOSIS — R0602 Shortness of breath: Secondary | ICD-10-CM | POA: Insufficient documentation

## 2023-11-23 DIAGNOSIS — I5032 Chronic diastolic (congestive) heart failure: Secondary | ICD-10-CM | POA: Insufficient documentation

## 2023-11-23 LAB — BASIC METABOLIC PANEL
Anion gap: 7 (ref 5–15)
BUN: 17 mg/dL (ref 8–23)
CO2: 28 mmol/L (ref 22–32)
Calcium: 11.3 mg/dL — ABNORMAL HIGH (ref 8.9–10.3)
Chloride: 107 mmol/L (ref 98–111)
Creatinine, Ser: 0.85 mg/dL (ref 0.44–1.00)
GFR, Estimated: >60 mL/min (ref >60)
Glucose, Bld: 100 mg/dL — ABNORMAL HIGH (ref 70–99)
Potassium: 4.1 mmol/L (ref 3.5–5.1)
Sodium: 142 mmol/L (ref 135–145)

## 2023-11-23 LAB — CBC
HCT: 39 % (ref 36.0–46.0)
Hemoglobin: 12.9 g/dL (ref 12.0–15.0)
MCH: 33.2 pg (ref 26.0–34.0)
MCHC: 33.1 g/dL (ref 30.0–36.0)
MCV: 100.5 fL — ABNORMAL HIGH (ref 80.0–100.0)
Platelets: 139 10*3/uL — ABNORMAL LOW (ref 150–400)
RBC: 3.88 MIL/uL (ref 3.87–5.11)
RDW: 14.6 % (ref 11.5–15.5)
WBC: 5.2 10*3/uL (ref 4.0–10.5)
nRBC: 0 % (ref 0.0–0.2)

## 2023-11-23 NOTE — Progress Notes (Signed)
PCP: Dr Nadene Rubins Primary HF Cardiologist: Dr Elwyn Lade  Chief Complaint: Palpitations/ Weight Gain/Dyspnea with exertion.   HPI: Carrie Guzman is a 78 year old with a history of pulmonary hypertension, chronic HFpEF, Cor Pulmonale, PE, lymph edema, thrombocytopenia, and pericardial effusion.   Admitted August 2024 with concerns of shortness of breath ongoing for 2-3 weeks. Patient was diagnosed with CHF exacerbation. She had elevated BNP, total bilirubin was elevated. Upon admission started on diuretics. Echocardiogram showed preserved EF with elevated PASP, cor pulmonale and pericardial effusion without tamponade. CTA chest showed acute pulmonary embolism but right side heart strain, case was discussed with pulmonary who recommended heparin otherwise no indication for thrombectomy at this time . Placed on Heparin drip followed by Eliquis. She was referred to [facility] clinic   RHC - 10/2023 -RA: 10, PA: 94/25 (48), PCWP 15, Fick CO/CI 6.27/3.16, PVR 5.27   11/21/23 - She called about 10 pound weight gain over the last 4-5 days. Placed on lasix 40 mg twice a day.   Today she returns for an acute visit for palpitations, weight gain, and dyspnea with exertion.  Over the last few weeks she started getting short of breath with exertion and gaining 1 pound per day.  Weight has gone up 205--->217 pounds. Denies PND/Orthopnea. Appetite ok. No fever or chills. Taking all medications but has not started sildenafil. Lives with her husband.    ROS: All systems negative except as listed in HPI, PMH and Problem List.  SH:  Social History   Socioeconomic History   Marital status: Married    Spouse name: Not on file   Number of children: Not on file   Years of education: Not on file   Highest education level: Not on file  Occupational History   Not on file  Tobacco Use   Smoking status: Former    Types: Cigarettes    Passive exposure: Never   Smokeless tobacco: Never  Vaping Use   Vaping status: Never Used   Substance and Sexual Activity   Alcohol use: Never   Drug use: Never   Sexual activity: Not on file  Other Topics Concern   Not on file  Social History Narrative   Not on file   Social Drivers of Health   Financial Resource Strain: Not on file  Food Insecurity: No Food Insecurity (06/21/2023)   Hunger Vital Sign    Worried About Running Out of Food in the Last Year: Never true    Ran Out of Food in the Last Year: Never true  Transportation Needs: No Transportation Needs (06/21/2023)   PRAPARE - Administrator, Civil Service (Medical): No    Lack of Transportation (Non-Medical): No  Physical Activity: Not on file  Stress: Not on file  Social Connections: Not on file  Intimate Partner Violence: Not At Risk (06/21/2023)   Humiliation, Afraid, Rape, and Kick questionnaire    Fear of Current or Ex-Partner: No    Emotionally Abused: No    Physically Abused: No    Sexually Abused: No    FH:  Family History  Problem Relation Age of Onset   Hypertension Father    Heart attack Father    Psoriasis Father    Diabetes Maternal Grandfather     Past Medical History:  Diagnosis Date   CHF (congestive heart failure) (HCC)    Elevated brain natriuretic peptide (BNP) level    Elevated cholesterol    Heart murmur    HLD (hyperlipidemia)  Hypertension    Lower extremity cellulitis    Lymphedema of lower extremity    Psoriasis    Squamous cell carcinoma of skin 05/12/2021   Left Lower Leg - anterior (in situ) (curet and 5FU)    Current Outpatient Medications  Medication Sig Dispense Refill   acetaminophen (TYLENOL) 650 MG CR tablet Take 1,300 mg by mouth as needed for pain.     apixaban (ELIQUIS) 5 MG TABS tablet Take 1 tablet (5 mg total) by mouth 2 (two) times daily. Please request future refills from Dr. Wynona Neat with pulmonology. 60 tablet 1   furosemide (LASIX) 40 MG tablet Take 40 mg by mouth 2 (two) times daily.     losartan (COZAAR) 25 MG tablet Take 1 tablet  (25 mg total) by mouth daily. 30 tablet 11   Menthol, Topical Analgesic, (BIOFREEZE EX) Apply 1 Application topically daily. As needed     metoprolol succinate (TOPROL XL) 25 MG 24 hr tablet Take 1 tablet (25 mg total) by mouth daily. 30 tablet 5   Multiple Vitamins-Minerals (CENTRUM SILVER 50+WOMEN) TABS Take 1 tablet by mouth daily.     OVER THE COUNTER MEDICATION Take 1 tablet by mouth daily at 2 am. smooth move     potassium chloride (KLOR-CON) 10 MEQ tablet Take 20 mEq by mouth daily.     rosuvastatin (CRESTOR) 10 MG tablet Take 10 mg by mouth daily.     triamcinolone (KENALOG) 0.025 % ointment Apply 1 Application topically daily.     sildenafil (REVATIO) 20 MG tablet Take 1 tablet (20 mg total) by mouth 3 (three) times daily. (Patient not taking: Reported on 11/23/2023) 90 tablet 3   No current facility-administered medications for this encounter.    Vitals:   11/23/23 1115  BP: 120/80  Pulse: (!) 104  Resp: 18  SpO2: 94%  Weight: 100.6 kg (221 lb 12.8 oz)   Wt Readings from Last 3 Encounters:  11/23/23 100.6 kg (221 lb 12.8 oz)  11/03/23 96.8 kg (213 lb 6.4 oz)  10/10/23 94.3 kg (208 lb)     PHYSICAL EXAM: General:  Arrived in a wheel chair. No resp difficulty HEENT: normal Neck: supple. JVP 8-9. Carotids 2+ bilaterally; no bruits. No lymphadenopathy or thryomegaly appreciated. Cor: PMI normal. Irregular rate & rhythm. No rubs, gallops or murmurs. Lungs: clear Abdomen: soft, nontender, nondistended. No hepatosplenomegaly. No bruits or masses. Good bowel sounds. Extremities: no cyanosis, clubbing, rash, R and LLE lymph edema wraps.  Neuro: alert & orientedx3, cranial nerves grossly intact. Moves all 4 extremities w/o difficulty. Affect pleasant.   ECG: Af ib RVR 110 RBBB  ASSESSMENT & PLAN:  1. Rtachycardia-->New Onset A fib  EKG 09/26/23- SR . No previous history EKG today A fib 110 bpm. Will need to set up cardioversion. Discussed with Dr Elwyn Lade.  Continue eliquis  5 mg twice a day. No indication for TEE. She has not missed doses of eliquis.  Discussed cardioversion. Scheduled cardioversion for next week.  Informed Consent   Shared Decision Making/Informed Consent{The risks (stroke, cardiac arrhythmias rarely resulting in the need for a temporary or permanent pacemaker, skin irritation or burns and complications associated with conscious sedation including aspiration, arrhythmia, respiratory failure and death), benefits (restoration of normal sinus rhythm) and alternatives of a direct current cardioversion were explained in detail to Carrie Guzman and she agrees to proceed.      2. Pulmonary Hypertension  H/O PE. CTA 06/2023- RUL pulmonary emboli. Echo Severe RV Failure. RVSP 94.  RHC 10/10/23 -PVR 5.27 Sleep study negative for OSA.   11/22/23  VQ scan- results pending. Continue eliquis 5 mg twice a day  She now has sildenafil at home but hasn't started. Asked her to started  sildenafil 20 mg tid.  Plan for repeat ECHO down the road and possible RHC. Consider adding ERA.  Will need once out of A fib.  Consider referral to Pulmonary Rehab.   3. 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  Grade IDD. NYHA III. Volume status trending up suspect in the setting of new onset A fib.  Yesterday she was started lasix. Plan to continue lasix 40 mg twice a day 20 meq potassium. Discussed limiting high sodium foods.  Check BMET today.  Next visit consider spiro.   4. Obesity  Body mass index is 38.07 kg/m. Discussed portion control.    Follow up next week for cardioversion and 6 weeks with Dr Elwyn Lade.   Dorraine Ellender NP-C  12:35 PM

## 2023-11-23 NOTE — Patient Instructions (Addendum)
Medication Changes:  PLEASE START: SILDENAFIL THREE TIMES DAILY   Lab Work:  Labs done today, your results will be available in MyChart, we will contact you for abnormal readings.  Testing/Procedures:  Your physician has recommended that you have a Cardioversion (DCCV). Electrical Cardioversion uses a jolt of electricity to your heart either through paddles or wired patches attached to your chest. This is a controlled, usually prescheduled, procedure. Defibrillation is done under light anesthesia in the hospital, and you usually go home the day of the procedure. This is done to get your heart back into a normal rhythm. You are not awake for the procedure. Please see the instruction sheet given to you today.  Special Instructions // Education:      Dear Carrie Guzman  You are scheduled for a Cardioversion on Friday, January 24 with Dr. Elwyn Lade.  Please arrive at the [facility] (Main Entrance A) at [facility]: [address] [place], [place] [address] at 6:30 AM (This time is 1 hour(s) before your procedure to ensure your preparation).   Free valet parking service is available. You will check in at ADMITTING.   *Please Note: You will receive a call the day before your procedure to confirm the appointment time. That time may have changed from the original time based on the schedule for that day.*   DIET:  Nothing to eat or drink after midnight except a sip of water with medications (see medication instructions below)  MEDICATION INSTRUCTIONS: !!IF ANY NEW MEDICATIONS ARE STARTED AFTER TODAY, PLEASE NOTIFY YOUR PROVIDER AS SOON AS POSSIBLE!!  FYI: Medications such as Semaglutide (Ozempic, [place]), Tirzepatide (Mounjaro, Zepbound), Dulaglutide (Trulicity), etc ("GLP1 agonists") AND Canagliflozin (Invokana), Dapagliflozin (Farxiga), Empagliflozin (Jardiance), Ertugliflozin (Steglatro), Bexagliflozin [employer]) or any combination with one of these drugs such as Invokamet  (Canagliflozin/Metformin), Synjardy (Empagliflozin/Metformin), etc ("SGLT2 inhibitors") must be held around the time of a procedure. This is not a comprehensive list of all of these drugs. Please review all of your medications and talk to your provider if you take any one of these. If you are not sure, ask your provider.        :Continue taking your anticoagulant (blood thinner): Apixaban (Eliquis).  You will need to continue this after your procedure until you are told by your provider that it is safe to stop.    DO NOT TAKE LASIX (FUROSEMIDE) THE MORNING OF PROCEDURE   LABS: TODAY   FYI:  For your safety, and to allow [place] to monitor your vital signs accurately during the surgery/procedure we request: If you have artificial nails, gel coating, SNS etc, please have those removed prior to your surgery/procedure. Not having the nail coverings /polish removed may result in cancellation or delay of your surgery/procedure.  Your support person will be asked to wait in the waiting room during your procedure.  It is OK to have someone drop you off and come back when you are ready to be discharged.  You cannot drive after the procedure and will need someone to drive you home.  Bring your insurance cards.  *Special Note: Every effort is made to have your procedure done on time. Occasionally there are emergencies that occur at the hospital that may cause delays. Please be patient if a delay does occur.    Follow-Up in: AS SCHEDULED WITH DR. Elwyn Lade   At the [facility], you and your health needs are our priority. We have a designated team specialized in the treatment of Heart  Failure. This Care Team includes your primary Heart Failure Specialized Cardiologist (physician), Advanced Practice Providers (APPs- Physician Assistants and Nurse Practitioners), and Pharmacist who all work together to provide you with the care you need, when you need it.   You may see any of the following  providers on your designated Care Team at your next follow up:  Dr. Arvilla Meres Dr. Marca Ancona Dr. Dorthula Nettles Dr. Theresia Bough Tonye Becket, NP Robbie Lis, [place] [facility] [place], [place] Brynda Peon, NP [place] Lee, NP Karle Plumber, PharmD   Please be sure to bring in all your medications bottles to every appointment.   Need to Contact us:  If you have any questions or concerns before your next appointment please send [place] a message through [place] or call our office at [phone].    TO LEAVE A MESSAGE FOR THE NURSE SELECT OPTION 2, PLEASE LEAVE A MESSAGE INCLUDING: YOUR NAME DATE OF BIRTH CALL BACK NUMBER REASON FOR CALL**this is important as we prioritize the call backs  YOU WILL RECEIVE A CALL BACK THE SAME DAY AS LONG AS YOU CALL BEFORE 4:00 PM

## 2023-11-23 NOTE — H&P (View-Only) (Signed)
PCP: Dr Nadene Rubins Primary HF Cardiologist: Dr Elwyn Lade  Chief Complaint: Palpitations/ Weight Gain/Dyspnea with exertion.   HPI: Carrie Guzman is a 78 year old with a history of pulmonary hypertension, chronic HFpEF, Cor Pulmonale, PE, lymph edema, thrombocytopenia, and pericardial effusion.   Admitted August 2024 with concerns of shortness of breath ongoing for 2-3 weeks. Patient was diagnosed with CHF exacerbation. She had elevated BNP, total bilirubin was elevated. Upon admission started on diuretics. Echocardiogram showed preserved EF with elevated PASP, cor pulmonale and pericardial effusion without tamponade. CTA chest showed acute pulmonary embolism but right side heart strain, case was discussed with pulmonary who recommended heparin otherwise no indication for thrombectomy at this time . Placed on Heparin drip followed by Eliquis. She was referred to [facility] clinic   RHC - 10/2023 -RA: 10, PA: 94/25 (48), PCWP 15, Fick CO/CI 6.27/3.16, PVR 5.27   11/21/23 - She called about 10 pound weight gain over the last 4-5 days. Placed on lasix 40 mg twice a day.   Today she returns for an acute visit for palpitations, weight gain, and dyspnea with exertion.  Over the last few weeks she started getting short of breath with exertion and gaining 1 pound per day.  Weight has gone up 205--->217 pounds. Denies PND/Orthopnea. Appetite ok. No fever or chills. Taking all medications but has not started sildenafil. Lives with her husband.    ROS: All systems negative except as listed in HPI, PMH and Problem List.  SH:  Social History   Socioeconomic History   Marital status: Married    Spouse name: Not on file   Number of children: Not on file   Years of education: Not on file   Highest education level: Not on file  Occupational History   Not on file  Tobacco Use   Smoking status: Former    Types: Cigarettes    Passive exposure: Never   Smokeless tobacco: Never  Vaping Use   Vaping status: Never Used   Substance and Sexual Activity   Alcohol use: Never   Drug use: Never   Sexual activity: Not on file  Other Topics Concern   Not on file  Social History Narrative   Not on file   Social Drivers of Health   Financial Resource Strain: Not on file  Food Insecurity: No Food Insecurity (06/21/2023)   Hunger Vital Sign    Worried About Running Out of Food in the Last Year: Never true    Ran Out of Food in the Last Year: Never true  Transportation Needs: No Transportation Needs (06/21/2023)   PRAPARE - Administrator, Civil Service (Medical): No    Lack of Transportation (Non-Medical): No  Physical Activity: Not on file  Stress: Not on file  Social Connections: Not on file  Intimate Partner Violence: Not At Risk (06/21/2023)   Humiliation, Afraid, Rape, and Kick questionnaire    Fear of Current or Ex-Partner: No    Emotionally Abused: No    Physically Abused: No    Sexually Abused: No    FH:  Family History  Problem Relation Age of Onset   Hypertension Father    Heart attack Father    Psoriasis Father    Diabetes Maternal Grandfather     Past Medical History:  Diagnosis Date   CHF (congestive heart failure) (HCC)    Elevated brain natriuretic peptide (BNP) level    Elevated cholesterol    Heart murmur    HLD (hyperlipidemia)  Hypertension    Lower extremity cellulitis    Lymphedema of lower extremity    Psoriasis    Squamous cell carcinoma of skin 05/12/2021   Left Lower Leg - anterior (in situ) (curet and 5FU)    Current Outpatient Medications  Medication Sig Dispense Refill   acetaminophen (TYLENOL) 650 MG CR tablet Take 1,300 mg by mouth as needed for pain.     apixaban (ELIQUIS) 5 MG TABS tablet Take 1 tablet (5 mg total) by mouth 2 (two) times daily. Please request future refills from Dr. Wynona Neat with pulmonology. 60 tablet 1   furosemide (LASIX) 40 MG tablet Take 40 mg by mouth 2 (two) times daily.     losartan (COZAAR) 25 MG tablet Take 1 tablet  (25 mg total) by mouth daily. 30 tablet 11   Menthol, Topical Analgesic, (BIOFREEZE EX) Apply 1 Application topically daily. As needed     metoprolol succinate (TOPROL XL) 25 MG 24 hr tablet Take 1 tablet (25 mg total) by mouth daily. 30 tablet 5   Multiple Vitamins-Minerals (CENTRUM SILVER 50+WOMEN) TABS Take 1 tablet by mouth daily.     OVER THE COUNTER MEDICATION Take 1 tablet by mouth daily at 2 am. smooth move     potassium chloride (KLOR-CON) 10 MEQ tablet Take 20 mEq by mouth daily.     rosuvastatin (CRESTOR) 10 MG tablet Take 10 mg by mouth daily.     triamcinolone (KENALOG) 0.025 % ointment Apply 1 Application topically daily.     sildenafil (REVATIO) 20 MG tablet Take 1 tablet (20 mg total) by mouth 3 (three) times daily. (Patient not taking: Reported on 11/23/2023) 90 tablet 3   No current facility-administered medications for this encounter.    Vitals:   11/23/23 1115  BP: 120/80  Pulse: (!) 104  Resp: 18  SpO2: 94%  Weight: 100.6 kg (221 lb 12.8 oz)   Wt Readings from Last 3 Encounters:  11/23/23 100.6 kg (221 lb 12.8 oz)  11/03/23 96.8 kg (213 lb 6.4 oz)  10/10/23 94.3 kg (208 lb)     PHYSICAL EXAM: General:  Arrived in a wheel chair. No resp difficulty HEENT: normal Neck: supple. JVP 8-9. Carotids 2+ bilaterally; no bruits. No lymphadenopathy or thryomegaly appreciated. Cor: PMI normal. Irregular rate & rhythm. No rubs, gallops or murmurs. Lungs: clear Abdomen: soft, nontender, nondistended. No hepatosplenomegaly. No bruits or masses. Good bowel sounds. Extremities: no cyanosis, clubbing, rash, R and LLE lymph edema wraps.  Neuro: alert & orientedx3, cranial nerves grossly intact. Moves all 4 extremities w/o difficulty. Affect pleasant.   ECG: Af ib RVR 110 RBBB  ASSESSMENT & PLAN:  1. Rtachycardia-->New Onset A fib  EKG 09/26/23- SR . No previous history EKG today A fib 110 bpm. Will need to set up cardioversion. Discussed with Dr Elwyn Lade.  Continue eliquis  5 mg twice a day. No indication for TEE. She has not missed doses of eliquis.  Discussed cardioversion. Scheduled cardioversion for next week.  Informed Consent   Shared Decision Making/Informed Consent{The risks (stroke, cardiac arrhythmias rarely resulting in the need for a temporary or permanent pacemaker, skin irritation or burns and complications associated with conscious sedation including aspiration, arrhythmia, respiratory failure and death), benefits (restoration of normal sinus rhythm) and alternatives of a direct current cardioversion were explained in detail to Carrie Guzman and she agrees to proceed.      2. Pulmonary Hypertension  H/O PE. CTA 06/2023- RUL pulmonary emboli. Echo Severe RV Failure. RVSP 94.  RHC 10/10/23 -PVR 5.27 Sleep study negative for OSA.   11/22/23  VQ scan- results pending. Continue eliquis 5 mg twice a day  She now has sildenafil at home but hasn't started. Asked her to started  sildenafil 20 mg tid.  Plan for repeat ECHO down the road and possible RHC. Consider adding ERA.  Will need once out of A fib.  Consider referral to Pulmonary Rehab.   3. 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  Grade IDD. NYHA III. Volume status trending up suspect in the setting of new onset A fib.  Yesterday she was started lasix. Plan to continue lasix 40 mg twice a day 20 meq potassium. Discussed limiting high sodium foods.  Check BMET today.  Next visit consider spiro.   4. Obesity  Body mass index is 38.07 kg/m. Discussed portion control.    Follow up next week for cardioversion and 6 weeks with Dr Elwyn Lade.   Carrie Holsapple NP-C  12:35 PM

## 2023-11-24 ENCOUNTER — Other Ambulatory Visit (HOSPITAL_COMMUNITY): Payer: Self-pay

## 2023-11-24 DIAGNOSIS — I4891 Unspecified atrial fibrillation: Secondary | ICD-10-CM

## 2023-11-24 MED ORDER — FUROSEMIDE 40 MG PO TABS: 40.0000 mg | ORAL_TABLET | Freq: Two times a day (BID) | ORAL | 6 refills | Status: DC

## 2023-11-24 MED ORDER — POTASSIUM CHLORIDE ER 10 MEQ PO TBCR: 20.0000 meq | EXTENDED_RELEASE_TABLET | Freq: Every day | ORAL | 6 refills | Status: DC

## 2023-11-24 NOTE — Addendum Note (Signed)
Addended by: Theresia Bough on: 11/24/2023 05:09 PM   Modules accepted: Orders

## 2023-11-29 ENCOUNTER — Encounter (HOSPITAL_COMMUNITY): Payer: Self-pay | Admitting: Cardiology

## 2023-11-30 ENCOUNTER — Telehealth (HOSPITAL_COMMUNITY): Payer: Self-pay

## 2023-11-30 NOTE — Telephone Encounter (Signed)
Spoke to patient about cardioversion scheduled for tomorrow. Aware of time and place. Has transportation.aware of not missing any Eliquis doses.Holding lasix in the morning

## 2023-11-30 NOTE — Progress Notes (Signed)
Instructed patient on medications to take, NPO after MN, a responsible adult drive and to stay with them for 24 hours post procedure, arrive at 0630am.

## 2023-12-01 ENCOUNTER — Ambulatory Visit (HOSPITAL_COMMUNITY)
Admission: RE | Admit: 2023-12-01 | Discharge: 2023-12-01 | Disposition: A | Discharge status: Home / Self Care | Payer: Medicare Other | Attending: Cardiology | Admitting: Cardiology

## 2023-12-01 ENCOUNTER — Ambulatory Visit (HOSPITAL_COMMUNITY): Payer: Medicare Other

## 2023-12-01 ENCOUNTER — Encounter (HOSPITAL_COMMUNITY)
Admission: RE | Disposition: A | Discharge status: Home / Self Care | Payer: Self-pay | Source: Home / Self Care | Attending: Cardiology

## 2023-12-01 ENCOUNTER — Other Ambulatory Visit: Payer: Self-pay

## 2023-12-01 DIAGNOSIS — I480 Paroxysmal atrial fibrillation: Secondary | ICD-10-CM | POA: Insufficient documentation

## 2023-12-01 DIAGNOSIS — I5032 Chronic diastolic (congestive) heart failure: Secondary | ICD-10-CM | POA: Diagnosis not present

## 2023-12-01 DIAGNOSIS — I4891 Unspecified atrial fibrillation: Secondary | ICD-10-CM

## 2023-12-01 DIAGNOSIS — Z87891 Personal history of nicotine dependence: Secondary | ICD-10-CM | POA: Insufficient documentation

## 2023-12-01 DIAGNOSIS — I272 Pulmonary hypertension, unspecified: Secondary | ICD-10-CM | POA: Diagnosis not present

## 2023-12-01 DIAGNOSIS — I5031 Acute diastolic (congestive) heart failure: Secondary | ICD-10-CM | POA: Diagnosis not present

## 2023-12-01 DIAGNOSIS — Z79899 Other long term (current) drug therapy: Secondary | ICD-10-CM | POA: Diagnosis not present

## 2023-12-01 DIAGNOSIS — Z8249 Family history of ischemic heart disease and other diseases of the circulatory system: Secondary | ICD-10-CM | POA: Diagnosis not present

## 2023-12-01 DIAGNOSIS — Z6838 Body mass index (BMI) 38.0-38.9, adult: Secondary | ICD-10-CM | POA: Diagnosis not present

## 2023-12-01 DIAGNOSIS — I110 Hypertensive heart disease with heart failure: Secondary | ICD-10-CM | POA: Diagnosis not present

## 2023-12-01 DIAGNOSIS — E669 Obesity, unspecified: Secondary | ICD-10-CM | POA: Insufficient documentation

## 2023-12-01 HISTORY — PX: CARDIOVERSION: EP1203

## 2023-12-01 SURGERY — CARDIOVERSION (CATH LAB)
Anesthesia: General

## 2023-12-01 MED ORDER — SODIUM CHLORIDE 0.9 % IV SOLN: INTRAVENOUS | Status: DC

## 2023-12-01 MED ORDER — PROPOFOL 10 MG/ML IV BOLUS
INTRAVENOUS | Status: DC | PRN
  Administered 2023-12-01: 60 mg via INTRAVENOUS

## 2023-12-01 MED ORDER — SPIRONOLACTONE 25 MG PO TABS: 12.5000 mg | ORAL_TABLET | Freq: Every day | ORAL | 3 refills | Status: DC

## 2023-12-01 MED ORDER — LIDOCAINE 2% (20 MG/ML) 5 ML SYRINGE
INTRAMUSCULAR | Status: DC | PRN
  Administered 2023-12-01: 60 mg via INTRAVENOUS

## 2023-12-01 MED ORDER — PHENYLEPHRINE HCL (PRESSORS) 10 MG/ML IV SOLN
INTRAVENOUS | Status: DC | PRN
  Administered 2023-12-01: 240 ug via INTRAVENOUS
  Administered 2023-12-01: 200 ug via INTRAVENOUS

## 2023-12-01 SURGICAL SUPPLY — 1 items: PAD DEFIB RADIO PHYSIO CONN (PAD) ×1 IMPLANT

## 2023-12-01 NOTE — Anesthesia Preprocedure Evaluation (Addendum)
Anesthesia Evaluation  Patient identified by MRN, date of birth, ID band Patient awake    Reviewed: Allergy & Precautions, NPO status , Patient's Chart, lab work & pertinent test results  Airway Mallampati: II  TM Distance: >3 FB Neck ROM: Full    Dental no notable dental hx.    Pulmonary former smoker   Pulmonary exam normal        Cardiovascular hypertension, Pt. on medications and Pt. on home beta blockers +CHF  + dysrhythmias Atrial Fibrillation  Rhythm:Irregular Rate:Normal     Neuro/Psych negative neurological ROS  negative psych ROS   GI/Hepatic negative GI ROS, Neg liver ROS,,,  Endo/Other  negative endocrine ROS    Renal/GU negative Renal ROS  negative genitourinary   Musculoskeletal negative musculoskeletal ROS (+)    Abdominal Normal abdominal exam  (+)   Peds  Hematology Lab Results      Component                Value               Date                      WBC                      5.2                 11/23/2023                HGB                      12.9                11/23/2023                HCT                      39.0                11/23/2023                MCV                      100.5 (H)           11/23/2023                PLT                      139 (L)             11/23/2023              Anesthesia Other Findings   Reproductive/Obstetrics                             Anesthesia Physical Anesthesia Plan  ASA: 3  Anesthesia Plan: General   Post-op Pain Management:    Induction:   PONV Risk Score and Plan: 3 and Treatment may vary due to age or medical condition  Airway Management Planned: Mask  Additional Equipment: None  Intra-op Plan:   Post-operative Plan:   Informed Consent: I have reviewed the patients History and Physical, chart, labs and discussed the procedure including the risks, benefits and alternatives for the proposed anesthesia with  the patient or authorized representative who has indicated his/her understanding and  acceptance.     Dental advisory given  Plan Discussed with: CRNA  Anesthesia Plan Comments:        Anesthesia Quick Evaluation

## 2023-12-01 NOTE — Discharge Instructions (Signed)

## 2023-12-01 NOTE — Interval H&P Note (Signed)
History and Physical Interval Note:  12/01/2023 7:30 AM  Carrie Guzman  has presented today for surgery, with the diagnosis of AFIB.  The various methods of treatment have been discussed with the patient and family. After consideration of risks, benefits and other options for treatment, the patient has consented to  Procedure(s): CARDIOVERSION (N/A) as a surgical intervention.  The patient's history has been reviewed, patient examined, no change in status, stable for surgery.  I have reviewed the patient's chart and labs.  Questions were answered to the patient's satisfaction.     Romie Minus

## 2023-12-01 NOTE — OR Nursing (Signed)
Treated BP with one NS bolus. Patient alert, oriented, and able to ambulate without symptoms. Per anesthesia, ok to DC home post cardioversion.

## 2023-12-01 NOTE — Transfer of Care (Signed)
Immediate Anesthesia Transfer of Care Note  Patient: Carrie Guzman  Procedure(s) Performed: CARDIOVERSION  Patient Location: PACU and Cath Lab  Anesthesia Type:MAC  Level of Consciousness: awake, alert , and patient cooperative  Airway & Oxygen Therapy: Patient Spontanous Breathing and Patient connected to face mask oxygen  Post-op Assessment: Report given to RN and Post -op Vital signs reviewed and stable  Post vital signs: Reviewed and stable  Last Vitals:  Vitals Value Taken Time  BP 96/57 12/01/23 0748  Temp 36.7 C 12/01/23 0748  Pulse 61 12/01/23 0751  Resp 16 12/01/23 0751  SpO2 98 % 12/01/23 0751  Vitals shown include unfiled device data.  Last Pain:  Vitals:   12/01/23 0748  TempSrc:   PainSc: 0-No pain         Complications: There were no known notable events for this encounter.

## 2023-12-01 NOTE — Procedures (Signed)
   DIRECT CURRENT CARDIOVERSION  NAME:  Carrie Guzman    MRN: [id number] DOB:  1945/12/13    ADMIT DATE: 12/01/2023  CARDIOVERSION:     Indications:  Symptomatic Atrial Fibrillation  Informed consent was obtained prior to the procedure. The risks, benefits and alternatives for the procedure were discussed and the patient comprehended these risks.  Risks include, but are not limited to treatment failure, burns to the chest, pain/discomfort, ventricular arrhythmia.   After a procedural time-out, sedation was performed by anesthesia. The patient had the defibrillator pads placed in the anterior and posterior position. Once an appropriate level of sedation was confirmed, the patient was cardioverted successfully with 200J of biphasic synchronized energy.  The patient converted to NSR.  MAP dropped to 50s due to sedation in the setting of severe PH, improved with phenylephrine.  The patient had normal neuro status and respiratory status post procedure with vitals stable as recorded elsewhere.  Adequate airway was maintained throughout and vital signs monitored per protocol.  COMPLICATIONS:    Complications: No complications Patient tolerated procedure well.  [name] Advanced Heart Failure 12:55 PM

## 2023-12-01 NOTE — Anesthesia Postprocedure Evaluation (Signed)
Anesthesia Post Note  Patient: Carrie Guzman  Procedure(s) Performed: CARDIOVERSION     Patient location during evaluation: PACU Anesthesia Type: General Level of consciousness: awake and alert Pain management: pain level controlled Vital Signs Assessment: post-procedure vital signs reviewed and stable Respiratory status: spontaneous breathing, nonlabored ventilation, respiratory function stable and patient connected to nasal cannula oxygen Cardiovascular status: blood pressure returned to baseline and stable Postop Assessment: no apparent nausea or vomiting Anesthetic complications: no   There were no known notable events for this encounter.  Last Vitals:  Vitals:   12/01/23 0755 12/01/23 0800  BP: (!) 97/52 (!) 92/51  Pulse: 64 69  Resp: 20 (!) 25  Temp:    SpO2: 92% 95%    Last Pain:  Vitals:   12/01/23 0748  TempSrc:   PainSc: 0-No pain                 Nelle Don Hollyanne Schloesser

## 2023-12-04 ENCOUNTER — Encounter (HOSPITAL_COMMUNITY): Payer: Self-pay | Admitting: Cardiology

## 2023-12-07 ENCOUNTER — Ambulatory Visit (INDEPENDENT_AMBULATORY_CARE_PROVIDER_SITE_OTHER): Payer: Medicare Other | Admitting: Pulmonary Disease

## 2023-12-07 ENCOUNTER — Encounter: Payer: Self-pay | Admitting: Pulmonary Disease

## 2023-12-07 VITALS — BP 124/78 | HR 95 | Temp 97.7°F | Ht 64.0 in | Wt 225.6 lb

## 2023-12-07 DIAGNOSIS — I5031 Acute diastolic (congestive) heart failure: Secondary | ICD-10-CM

## 2023-12-07 DIAGNOSIS — I272 Pulmonary hypertension, unspecified: Secondary | ICD-10-CM

## 2023-12-07 DIAGNOSIS — R0600 Dyspnea, unspecified: Secondary | ICD-10-CM

## 2023-12-07 MED ORDER — ALBUTEROL SULFATE HFA 108 (90 BASE) MCG/ACT IN AERS: 2.0000 | INHALATION_SPRAY | Freq: Four times a day (QID) | RESPIRATORY_TRACT | 6 refills | Status: AC | PRN

## 2023-12-07 NOTE — Patient Instructions (Signed)
I will see you in about 3 to 4 months  Will send the next prescription for an inhaler to be used as needed 2 puffs up to 4 times a day  You may use it about 15 minutes before exercise will help keep your breathing tubes as open as we can get it  Continue your water pills  Call us with significant concerns

## 2023-12-07 NOTE — Progress Notes (Signed)
Carrie Guzman    [id number]    1946/06/04  Primary Care Physician:Wile, Nyoka Cowden, MD  Referring Physician: Melida Quitter, MD [address] [place],  [place] [address]  Chief complaint:   Patient with recent PE, pulmonary hypertension  HPI:  Treated for pulmonary embolism in the hospital Noted to have severe pulmonary hypertension for which Carrie Guzman recently saw Dr. Elwyn Lade of cardiology -Records reviewed Recently had cardioversion for atrial fibrillation Recently had cardiac catheterization  Evaluation for sleep apnea was negative for significant sleep disordered breathing  Overall, has been having some more shortness of breath Herself and her spouse feel that her breathing did get worse following having a pulmonary function test performed  Denies wheezing  Does get short of breath with moderate activity Carrie Guzman stated that short duration activities do not make her short of breath  Optimizing diuretics at present  Encouraged to continue oxygen use at night Encouraged to continue her blood thinners as well  Past history of smoking quit in 1973 Was initially a social smoker smoked about a pack a day for about 8 years  History of chronic lower extremity lymphedema, hypertension, morbid obesity  Was evaluated for a pulmonary embolism following increasing complaints of shortness of breath - diagnosed with pulmonary embolism with right heart strain  Outpatient Encounter Medications as of 12/07/2023  Medication Sig   acetaminophen (TYLENOL) 650 MG CR tablet Take 1,300 mg by mouth every 8 (eight) hours as needed for pain.   apixaban (ELIQUIS) 5 MG TABS tablet Take 1 tablet (5 mg total) by mouth 2 (two) times daily. Please request future refills from Dr. Wynona Neat with pulmonology.   furosemide (LASIX) 40 MG tablet Take 1 tablet (40 mg total) by mouth 2 (two) times daily.   Menthol, Topical Analgesic, (BIOFREEZE EX) Apply 1 Application topically as needed (pain).   metoprolol  succinate (TOPROL XL) 25 MG 24 hr tablet Take 1 tablet (25 mg total) by mouth daily.   Multiple Vitamins-Minerals (CENTRUM SILVER 50+WOMEN) TABS Take 1 tablet by mouth daily.   OVER THE COUNTER MEDICATION Take 1 tablet by mouth daily at 2 am. smooth move   potassium chloride (KLOR-CON) 10 MEQ tablet Take 2 tablets (20 mEq total) by mouth daily. (Patient taking differently: Take 10 mEq by mouth 2 (two) times daily.)   rosuvastatin (CRESTOR) 10 MG tablet Take 10 mg by mouth daily.   sildenafil (REVATIO) 20 MG tablet Take 1 tablet (20 mg total) by mouth 3 (three) times daily.   spironolactone (ALDACTONE) 25 MG tablet Take 0.5 tablets (12.5 mg total) by mouth daily.   triamcinolone (KENALOG) 0.025 % ointment Apply 1 Application topically daily as needed (irritation).   No facility-administered encounter medications on file as of 12/07/2023.    Allergies as of 12/07/2023   (No Known Allergies)    Past Medical History:  Diagnosis Date   CHF (congestive heart failure) (HCC)    Elevated brain natriuretic peptide (BNP) level    Elevated cholesterol    Heart murmur    HLD (hyperlipidemia)    Hypertension    Lower extremity cellulitis    Lymphedema of lower extremity    Psoriasis    Squamous cell carcinoma of skin 05/12/2021   Left Lower Leg - anterior (in situ) (curet and 5FU)    Past Surgical History:  Procedure Laterality Date   ABDOMINAL HERNIA REPAIR     ABDOMINAL HYSTERECTOMY     ANKLE FRACTURE SURGERY  CARDIOVERSION N/A 12/01/2023   Procedure: CARDIOVERSION;  Surgeon: Romie Minus, MD;  Location: [facility] INVASIVE CV LAB;  Service: Cardiovascular;  Laterality: N/A;   CATARACT EXTRACTION     CHOLECYSTECTOMY     RIGHT HEART CATH N/A 10/10/2023   Procedure: RIGHT HEART CATH;  Surgeon: Romie Minus, MD;  Location[facility] INVASIVE CV LAB;  Service: Cardiovascular;  Laterality: N/A;    Family History  Problem Relation Age of Onset   Hypertension Father    Heart attack Father     Psoriasis Father    Diabetes Maternal Grandfather     Social History   Socioeconomic History   Marital status: Married    Spouse name: Not on file   Number of children: Not on file   Years of education: Not on file   Highest education level: Not on file  Occupational History   Not on file  Tobacco Use   Smoking status: Former    Types: Cigarettes    Passive exposure: Never   Smokeless tobacco: Never  Vaping Use   Vaping status: Never Used  Substance and Sexual Activity   Alcohol use: Never   Drug use: Never   Sexual activity: Not on file  Other Topics Concern   Not on file  Social History Narrative   Not on file   Social Drivers of Health   Financial Resource Strain: Not on file  Food Insecurity: No Food Insecurity (06/21/2023)   Hunger Vital Sign    Worried About Running Out of Food in the Last Year: Never true    Ran Out of Food in the Last Year: Never true  Transportation Needs: No Transportation Needs (06/21/2023)   PRAPARE - Administrator, Civil Service (Medical): No    Lack of Transportation (Non-Medical): No  Physical Activity: Not on file  Stress: Not on file  Social Connections: Not on file  Intimate Partner Violence: Not At Risk (06/21/2023)   Humiliation, Afraid, Rape, and Kick questionnaire    Fear of Current or Ex-Partner: No    Emotionally Abused: No    Physically Abused: No    Sexually Abused: No    Review of Systems  Constitutional:  Negative for fatigue.  Respiratory:  Positive for shortness of breath.   Cardiovascular:  Positive for leg swelling.  Psychiatric/Behavioral:  Negative for sleep disturbance.     Vitals:   12/07/23 0934  BP: 124/78  Pulse: 95  Temp: 97.7 F (36.5 C)  SpO2: 93%     Physical Exam Constitutional:      Appearance: Carrie Guzman is obese.  HENT:     Head: Normocephalic.     Mouth/Throat:     Mouth: Mucous membranes are moist.  Eyes:     General: No scleral icterus. Cardiovascular:     Rate and  Rhythm: Normal rate and regular rhythm.     Heart sounds: No murmur heard.    No friction rub.  Pulmonary:     Effort: No respiratory distress.     Breath sounds: No stridor. No wheezing or rhonchi.  Musculoskeletal:     Cervical back: No rigidity or tenderness.  Neurological:     Mental Status: Carrie Guzman is alert.  Psychiatric:        Mood and Affect: Mood normal.    Data Reviewed: Recent hospital records reviewed  Pulmonary function test from 11/06/2023 does show significant bronchodilator response  Echocardiogram reviewed showing grade 1 diastolic dysfunction, severely reduced right ventricular function, severely dilated  right atrial size with severe pulmonary hypertension  Recent sleep study 10/15/2023, home sleep test revealed no significant sleep disordered breathing, O2 nadir of 86%  Cardiac catheterization PA pressure 94/25 with a mean of 48, wedge pressure of 15, PVR 5.27 Wood units  Assessment:  Recent pulmonary embolism  Severe pulmonary hypertension -Will follow-up with cardiology, plan is to start on vasodilator therapy at next clinic visit  Does not have significant sleep disordered breathing  Deconditioning -Continues to participate in rehab and is getting stronger  Hypoxemia -Appears to overall have improved  Pulmonary function test with significant bronchodilator response -I did discuss options of treatment with the patient -We agreed on trying a rescue inhaler to be used as needed or before endurance activities  Plan/Recommendations:  Bronchodilator treatment trial  Encouraged to make sure Carrie Guzman follows up with cardiology for initiation of vasodilator therapy  Graded activities as tolerated  I plan to see her back within about 3 to 4 months  Continue diuretics  I believe Carrie Guzman should see some improvement in symptoms/quality of life with management of severe pulmonary hypertension  Virl Diamond MD [facility] 12/07/2023, 9:40  AM  CC: Melida Quitter, MD

## 2023-12-11 ENCOUNTER — Other Ambulatory Visit (HOSPITAL_COMMUNITY): Payer: Self-pay | Admitting: Cardiology

## 2023-12-11 MED ORDER — APIXABAN 5 MG PO TABS: 5.0000 mg | ORAL_TABLET | Freq: Two times a day (BID) | ORAL | 1 refills | Status: DC

## 2023-12-12 ENCOUNTER — Other Ambulatory Visit: Payer: Self-pay | Admitting: Hematology

## 2023-12-12 DIAGNOSIS — D696 Thrombocytopenia, unspecified: Secondary | ICD-10-CM

## 2023-12-12 DIAGNOSIS — I2609 Other pulmonary embolism with acute cor pulmonale: Secondary | ICD-10-CM

## 2023-12-13 ENCOUNTER — Inpatient Hospital Stay: Payer: Medicare Other

## 2023-12-13 ENCOUNTER — Inpatient Hospital Stay: Payer: Medicare Other | Attending: Hematology | Admitting: Hematology

## 2023-12-13 VITALS — BP 155/76 | HR 83 | Temp 97.6°F | Resp 17 | Ht 64.0 in | Wt 221.7 lb

## 2023-12-13 DIAGNOSIS — D696 Thrombocytopenia, unspecified: Secondary | ICD-10-CM | POA: Diagnosis present

## 2023-12-13 DIAGNOSIS — I2609 Other pulmonary embolism with acute cor pulmonale: Secondary | ICD-10-CM

## 2023-12-13 DIAGNOSIS — Z86711 Personal history of pulmonary embolism: Secondary | ICD-10-CM | POA: Insufficient documentation

## 2023-12-13 DIAGNOSIS — Z79899 Other long term (current) drug therapy: Secondary | ICD-10-CM | POA: Diagnosis not present

## 2023-12-13 LAB — CMP (CANCER CENTER ONLY)
ALT: 9 U/L (ref 0–44)
AST: 19 U/L (ref 15–41)
Albumin: 3.5 g/dL (ref 3.5–5.0)
Alkaline Phosphatase: 73 U/L (ref 38–126)
Anion gap: 5 (ref 5–15)
BUN: 17 mg/dL (ref 8–23)
CO2: 31 mmol/L (ref 22–32)
Calcium: 11 mg/dL — ABNORMAL HIGH (ref 8.9–10.3)
Chloride: 105 mmol/L (ref 98–111)
Creatinine: 0.77 mg/dL (ref 0.44–1.00)
GFR, Estimated: >60 mL/min (ref >60)
Glucose, Bld: 117 mg/dL — ABNORMAL HIGH (ref 70–99)
Potassium: 3.9 mmol/L (ref 3.5–5.1)
Sodium: 141 mmol/L (ref 135–145)
Total Bilirubin: 1.9 mg/dL — ABNORMAL HIGH (ref 0.0–1.2)
Total Protein: 7.1 g/dL (ref 6.5–8.1)

## 2023-12-13 LAB — CBC WITH DIFFERENTIAL (CANCER CENTER ONLY)
Abs Immature Granulocytes: 0.02 10*3/uL (ref 0.00–0.07)
Basophils Absolute: 0 10*3/uL (ref 0.0–0.1)
Basophils Relative: 0 %
Eosinophils Absolute: 0.1 10*3/uL (ref 0.0–0.5)
Eosinophils Relative: 2 %
HCT: 39.6 % (ref 36.0–46.0)
Hemoglobin: 12.7 g/dL (ref 12.0–15.0)
Immature Granulocytes: 0 %
Lymphocytes Relative: 28 %
Lymphs Abs: 1.3 10*3/uL (ref 0.7–4.0)
MCH: 32.1 pg (ref 26.0–34.0)
MCHC: 32.1 g/dL (ref 30.0–36.0)
MCV: 100 fL (ref 80.0–100.0)
Monocytes Absolute: 0.6 10*3/uL (ref 0.1–1.0)
Monocytes Relative: 14 %
Neutro Abs: 2.5 10*3/uL (ref 1.7–7.7)
Neutrophils Relative %: 56 %
Platelet Count: 133 10*3/uL — ABNORMAL LOW (ref 150–400)
RBC: 3.96 MIL/uL (ref 3.87–5.11)
RDW: 13.9 % (ref 11.5–15.5)
WBC Count: 4.5 10*3/uL (ref 4.0–10.5)
nRBC: 0 % (ref 0.0–0.2)

## 2023-12-13 LAB — VITAMIN D 25 HYDROXY (VIT D DEFICIENCY, FRACTURES): Vit D, 25-Hydroxy: 48.79 ng/mL (ref 30–100)

## 2023-12-13 LAB — LACTATE DEHYDROGENASE: LDH: 129 U/L (ref 98–192)

## 2023-12-13 NOTE — Progress Notes (Signed)
 HEMATOLOGY/ONCOLOGY CLINIC VISIT NOTE  Date of Service: 12/13/2023  Patient Care Team: Stephane Leita DEL, MD as PCP - General (Internal Medicine) Shlomo Wilbert SAUNDERS, MD as PCP - Cardiology (Cardiology) Livingston Rigg, MD (Inactive) as Consulting Physician (Dermatology) Onesimo Emaline Brink, MD as Consulting Physician (Hematology)  CHIEF COMPLAINTS/PURPOSE OF CONSULTATION:  Thrombocytopenia and Pulmonary embolism   HISTORY OF PRESENTING ILLNESS:    Carrie Guzman is a wonderful 78 y.o. female who has been referred to us  by attending Dr. Burgess Dare, MD, for evaluation and management of Thrombocytopenia. Patient's lab shows platelets have dropped from 111 K, on 06/21/2023, to 65 K today, 06/23/2023.    Patient has a history of chronic lower extremity lymphedema/venous insufficiency, hypertension, morbid obesity .Body mass index is 46.31 kg/m.  Who was seen as outpatient for 2 weeks ago for upper respiratory infection and had testing which showed she was COVID-negative.  Patient notes she was treated with steroids. She presented again to [facility] with increasing shortness of breath and was noted to have heart rate in the 50s and oxygen saturation of 86% with some decreased mentation per her husband. Patient was initially thought to have heart failure and was started on diuretics and had an echo which showed severe pulmonary hypertension and right-sided cor pulmonale. Subsequent CTA of the chest showed significant right upper lobe segmental and subsegmental pulmonary emboli with evidence of right heart strain.  Cardiomegaly with moderate to large pericardial effusion.   Patient is thought to have obstructive sleep apnea/obesity related hypoventilation at baseline that is undiagnosed might be causing some element of pulmonary hypertension.    She was started on IV heparin .  She was noted to have elevated bilirubin level on admission likely due to hepatic congestion this is  improving. She was also noted to have slight thrombocytopenia of 111k on admission.  Platelet counts today dropped to 65k will consulted to evaluate her thrombocytopenia. No other recent hospitalization blood counts were available for comparison to determine if her thrombocytopenia is chronic. Patient denies any bleeding issues.  No nosebleeds no gum bleeds hematuria or hematochezia. Denies any previous known history of thrombocytopenia. Has been taking Tylenol  arthritis for joint issues.  Denies significant other NSAID use. Husband was at bedside during this hospital  interview.  INTERVAL HISTORY:  Carrie Guzman is a wonderful 78 y.o. female who is here for continued evaluation and management of Thrombocytopenia and Pulmonary embolism. Patient had a phone visit with me on 08/23/2023 to discuss lab workup.  Today, she is accompanied by her husband and presents with a walker. Patient reports that her overall status has been fluctuating. Patient reports no new symptoms.   Patient did have a cardioversion for her afib. Patient reports that her injection did improve her SOB and her afib was noted to be contributing to her SOB. She notes that she did not endorse any SOB while walking to clinic today.   Patient did have a sleep study recently which showed no major sleep apnea.   Patient reports having elevated blood pressure yesterday and her BP in clinic today is 155/76. She reports that she has been taken off of Losartan  and has not taken it for about two weeks. She does check her blood pressure at home and she reports having elevated reading at home for a couple of days.  Patient has an upcoming appointment with Dr. Bethena on 12/18/2023.   She denies any major leg swelling. Patient does wrap her legs and  keeps her leg elevated. She generally does not use compression socks.   Patient generally endorses an episode of epistaxis once in a while, but denies any significant bleeding concerns.    Patient complains of discomfort in her biceps, which she attributes to leaning too heavily on one side while using her walker. She denies any back pain or bone pain.   She reports that she has not had a discussion in regards to high calcium  with PCP. Patient generally follows with her PCP twice a year.   MEDICAL HISTORY:  Past Medical History:  Diagnosis Date   CHF (congestive heart failure) (HCC)    Elevated brain natriuretic peptide (BNP) level    Elevated cholesterol    Heart murmur    HLD (hyperlipidemia)    Hypertension    Lower extremity cellulitis    Lymphedema of lower extremity    Psoriasis    Squamous cell carcinoma of skin 05/12/2021   Left Lower Leg - anterior (in situ) (curet and 5FU)    SURGICAL HISTORY: Past Surgical History:  Procedure Laterality Date   ABDOMINAL HERNIA REPAIR     ABDOMINAL HYSTERECTOMY     ANKLE FRACTURE SURGERY     CARDIOVERSION N/A 12/01/2023   Procedure: CARDIOVERSION;  Surgeon: Zenaida Morene PARAS, MD;  Location: [facility] INVASIVE CV LAB;  Service: Cardiovascular;  Laterality: N/A;   CATARACT EXTRACTION     CHOLECYSTECTOMY     RIGHT HEART CATH N/A 10/10/2023   Procedure: RIGHT HEART CATH;  Surgeon: Zenaida Morene PARAS, MD;  Location: [facility] INVASIVE CV LAB;  Service: Cardiovascular;  Laterality: N/A;    SOCIAL HISTORY: Social History   Socioeconomic History   Marital status: Married    Spouse name: Not on file   Number of children: Not on file   Years of education: Not on file   Highest education level: Not on file  Occupational History   Not on file  Tobacco Use   Smoking status: Former    Types: Cigarettes    Passive exposure: Never   Smokeless tobacco: Never  Vaping Use   Vaping status: Never Used  Substance and Sexual Activity   Alcohol use: Never   Drug use: Never   Sexual activity: Not on file  Other Topics Concern   Not on file  Social History Narrative   Not on file   Social Drivers of Health   Financial Resource  Strain: Not on file  Food Insecurity: No Food Insecurity (06/21/2023)   Hunger Vital Sign    Worried About Running Out of Food in the Last Year: Never true    Ran Out of Food in the Last Year: Never true  Transportation Needs: No Transportation Needs (06/21/2023)   PRAPARE - Administrator, Civil Service (Medical): No    Lack of Transportation (Non-Medical): No  Physical Activity: Not on file  Stress: Not on file  Social Connections: Not on file  Intimate Partner Violence: Not At Risk (06/21/2023)   Humiliation, Afraid, Rape, and Kick questionnaire    Fear of Current or Ex-Partner: No    Emotionally Abused: No    Physically Abused: No    Sexually Abused: No    FAMILY HISTORY: Family History  Problem Relation Age of Onset   Hypertension Father    Heart attack Father    Psoriasis Father    Diabetes Maternal Grandfather     ALLERGIES:  has no known allergies.  MEDICATIONS:  Current Outpatient Medications  Medication Sig Dispense  Refill   acetaminophen  (TYLENOL ) 650 MG CR tablet Take 1,300 mg by mouth every 8 (eight) hours as needed for pain.     albuterol  (VENTOLIN  HFA) 108 (90 Base) MCG/ACT inhaler Inhale 2 puffs into the lungs every 6 (six) hours as needed for wheezing or shortness of breath. 8 g 6   apixaban  (ELIQUIS ) 5 MG TABS tablet Take 1 tablet (5 mg total) by mouth 2 (two) times daily. Please request future refills from Dr. Neda with pulmonology. 60 tablet 1   furosemide  (LASIX ) 40 MG tablet Take 1 tablet (40 mg total) by mouth 2 (two) times daily. 60 tablet 6   Menthol, Topical Analgesic, (BIOFREEZE EX) Apply 1 Application topically as needed (pain).     metoprolol  succinate (TOPROL  XL) 25 MG 24 hr tablet Take 1 tablet (25 mg total) by mouth daily. 30 tablet 5   Multiple Vitamins-Minerals (CENTRUM SILVER 50+WOMEN) TABS Take 1 tablet by mouth daily.     OVER THE COUNTER MEDICATION Take 1 tablet by mouth daily at 2 am. smooth move     potassium chloride   (KLOR-CON ) 10 MEQ tablet Take 2 tablets (20 mEq total) by mouth daily. (Patient taking differently: Take 10 mEq by mouth 2 (two) times daily.) 60 tablet 6   rosuvastatin  (CRESTOR ) 10 MG tablet Take 10 mg by mouth daily.     sildenafil  (REVATIO ) 20 MG tablet Take 1 tablet (20 mg total) by mouth 3 (three) times daily. 90 tablet 3   spironolactone  (ALDACTONE ) 25 MG tablet Take 0.5 tablets (12.5 mg total) by mouth daily. 45 tablet 3   triamcinolone (KENALOG) 0.025 % ointment Apply 1 Application topically daily as needed (irritation).     No current facility-administered medications for this visit.    REVIEW OF SYSTEMS:    10 Point review of Systems was done is negative except as noted above.   PHYSICAL EXAMINATION:   .BP (!) 155/76 (BP Location: Left Arm, Patient Position: Sitting) Comment: Nurse notified  Pulse 83   Temp 97.6 F (36.4 C)   Resp 17   Ht 5' 4 (1.626 m)   Wt 221 lb 11.2 oz (100.6 kg)   SpO2 96%   BMI 38.05 kg/m   GENERAL:alert, in no acute distress and comfortable SKIN: no acute rashes, no significant lesions EYES: conjunctiva are pink and non-injected, sclera anicteric OROPHARYNX: MMM, no exudates, no oropharyngeal erythema or ulceration NECK: supple, no JVD LYMPH:  no palpable lymphadenopathy in the cervical, axillary or inguinal regions LUNGS: clear to auscultation b/l with normal respiratory effort HEART: regular rate & rhythm ABDOMEN:  normoactive bowel sounds , non tender, not distended. Extremity: no pedal edema PSYCH: alert & oriented x 3 with fluent speech NEURO: no focal motor/sensory deficits   LABORATORY DATA:  I have reviewed the data as listed  .    Latest Ref Rng & Units 12/13/2023    8:25 AM 11/23/2023   12:00 PM 10/10/2023   12:42 PM  CBC  WBC 4.0 - 10.5 K/uL 4.5  5.2    Hemoglobin 12.0 - 15.0 g/dL 87.2  87.0  88.3   Hematocrit 36.0 - 46.0 % 39.6  39.0  34.0   Platelets 150 - 400 K/uL 133  139      .    Latest Ref Rng & Units 12/13/2023     9:29 AM 12/13/2023    8:25 AM 11/23/2023   12:00 PM  CMP  Glucose 70 - 99 mg/dL  882  899   BUN  8 - 23 mg/dL  17  17   Creatinine 9.55 - 1.00 mg/dL  9.22  9.14   Sodium 864 - 145 mmol/L  141  142   Potassium 3.5 - 5.1 mmol/L  3.9  4.1   Chloride 98 - 111 mmol/L  105  107   CO2 22 - 32 mmol/L  31  28   Calcium  8.7 - 10.3 mg/dL 89.2  88.9  88.6   Total Protein 6.5 - 8.1 g/dL  7.1    Total Bilirubin 0.0 - 1.2 mg/dL  1.9    Alkaline Phos 38 - 126 U/L  73    AST 15 - 41 U/L  19    ALT 0 - 44 U/L  9       RADIOGRAPHIC STUDIES: I have personally reviewed the radiological images as listed and agreed with the findings in the report. EP STUDY Result Date: 12/01/2023 See surgical note for result.  NM Pulmonary Perfusion Result Date: 11/28/2023 CLINICAL DATA:  Shortness of breath for 1 month. History of lung disease and smoking. EXAM: NUCLEAR MEDICINE PERFUSION LUNG SCAN TECHNIQUE: Perfusion images were obtained in multiple projections after intravenous injection of radiopharmaceutical. Ventilation scans intentionally deferred if perfusion scan and chest x-ray adequate for interpretation during COVID 19 epidemic. RADIOPHARMACEUTICALS:  3.8 mCi Tc-20m MAA IV COMPARISON:  Chest x-ray 11/22/2023 FINDINGS: There is normal perfusion to both lungs. No evidence for pleural based wedge-shaped defects. IMPRESSION: Normal perfusion. Electronically Signed   By: Almarie Daring M.D.   On: 11/28/2023 06:20   DG Chest 2 View Result Date: 11/28/2023 CLINICAL DATA:  Pulmonary hypertension. EXAM: CHEST - 2 VIEW COMPARISON:  10/02/2023 FINDINGS: There is marked enlargement of the heart, similar to prior studies. There are small bilateral pleural effusions probably stable. There is no evidence for pulmonary edema. There are no focal consolidations. There are significant degenerative changes in both shoulders. IMPRESSION: 1. Marked cardiomegaly. 2. Small bilateral pleural effusions. Electronically Signed   By:  Almarie Daring M.D.   On: 11/28/2023 06:19    ASSESSMENT & PLAN:  78 year old female with a history of morbid obesity, possible CHF, hypertension, dyslipidemia with   #1 significant right upper lobe segmental and subsegmental pulmonary embolism with right heart strain.   #2 severe pulmonary hypertension thought to be related to PE but also due to likely undiagnosed OSA/OHS diastolic CHF.   #3 moderate to large pericardial effusion-being evaluated by cardiology.   #4 Thrombocytopenia in the context of large PE, recent URI recent use of heparin . No overt platelet clumping Could also be related to recent viral URI. Could also be from significant hepatic congestion in the context of increased venous pressures Dilutional due to volume overload  #5 Hypercalcemia PLAN:  -Discussed lab results on 12/13/23 in detail with patient. CBC stable, showed WBC of 4.5K, hemoglobin of 12.7, and platelets of 133K. -platelets are stable -CMP shows that her bilirubin has improved to 1.9 mg/dL from 3.7 six months ago. Will hold off on referral to a liver specialist at this time.  -CMP today shows elevated calcium  at 11.0 mg/dL. Her calcium  did nearly normalize to 10.7 one month ago before increasing again.  -patient has not discussed concerns of elevated calcium  with PCP -educated patient that factors including dehydration and increased parathyroid hormones can cause high calcium .  -will order whole body bone scan and blood tests to evaluate elevated calcium  levels -patient has not discussed concern of abnormal liver enzyme findings from chronic liver disease related to  congestion from heart issues with her PCP -educated patient that abnormal liver enzymes can cause low protein S levels -will follow up on Protein S lab. Discussed that if her Protein S levels continue to be low, it would be a reason to be on blood thinners for the long term along with other reasons involving her heart issues.  -discussed  that she should connect with her cardiologist to adjust her medications if her blood pressure remains elevated -continue blood thinners for blood clot prevention as well as due to Atrial fibrillation  -will plan for a phone visit in 2 weeks  FOLLOW-UP: Additional labs today Whole body skeletal survey Phone visit with Dr Onesimo in 3 weeks  The total time spent in the appointment was 32 minutes* .  All of the patient's questions were answered with apparent satisfaction. The patient knows to call the clinic with any problems, questions or concerns.   Emaline Onesimo MD MS AAHIVMS [facility] Hematology/Oncology Physician [facility]  .*Total Encounter Time as defined by the Centers for Medicare and Medicaid Services includes, in addition to the face-to-face time of a patient visit (documented in the note above) non-face-to-face time: obtaining and reviewing outside history, ordering and reviewing medications, tests or procedures, care coordination (communications with other health care professionals or caregivers) and documentation in the medical record.    I,Mitra Faeizi,acting as a neurosurgeon for Emaline Onesimo, MD.,have documented all relevant documentation on the behalf of Emaline Onesimo, MD,as directed by  Emaline Onesimo, MD while in the presence of Emaline Onesimo, MD.  .I have reviewed the above documentation for accuracy and completeness, and I agree with the above. .Atina Feeley Kishore Keirah Konitzer MD

## 2023-12-15 ENCOUNTER — Telehealth: Payer: Self-pay | Admitting: Hematology

## 2023-12-15 ENCOUNTER — Telehealth (HOSPITAL_COMMUNITY): Payer: Self-pay | Admitting: Cardiology

## 2023-12-15 LAB — PTH, INTACT AND CALCIUM
Calcium, Total (PTH): 10.7 mg/dL — ABNORMAL HIGH (ref 8.7–10.3)
PTH: 53 pg/mL (ref 15–65)

## 2023-12-15 LAB — KAPPA/LAMBDA LIGHT CHAINS
Kappa free light chain: 59.8 mg/L — ABNORMAL HIGH (ref 3.3–19.4)
Kappa, lambda light chain ratio: 1.3 (ref 0.26–1.65)
Lambda free light chains: 46.1 mg/L — ABNORMAL HIGH (ref 5.7–26.3)

## 2023-12-15 LAB — PROTEIN S PANEL
Protein S Activity: 65 % (ref 63–140)
Protein S Ag, Free: 84 % (ref 61–136)
Protein S Ag, Total: 74 % (ref 60–150)

## 2023-12-15 LAB — ANGIOTENSIN CONVERTING ENZYME: Angiotensin-Converting Enzyme: 71 U/L (ref 14–82)

## 2023-12-15 LAB — CALCITRIOL (1,25 DI-OH VIT D): Vit D, 1,25-Dihydroxy: 38.5 pg/mL (ref 24.8–81.5)

## 2023-12-15 LAB — CALCIUM, IONIZED: Calcium, Ionized, Serum: 5.9 mg/dL — ABNORMAL HIGH (ref 4.5–5.6)

## 2023-12-15 NOTE — Telephone Encounter (Signed)
 Spoke with patient confirming upcoming appointment

## 2023-12-15 NOTE — Telephone Encounter (Signed)
 Front office called patient at [phone] to remind patient of appointment with Dr. Zenaida on Monday 12/18/23 at 9:20 AM.   Front office spoke to patient and patient confirmed that she will be here for her appointment - - patient was also made aware of AHF clinic location for her appointment on Monday 12/18/23.  Front office let patient know to come to entrance [address] on [address] for her appointment.

## 2023-12-18 ENCOUNTER — Encounter (HOSPITAL_COMMUNITY): Payer: Self-pay | Admitting: Cardiology

## 2023-12-18 ENCOUNTER — Ambulatory Visit (HOSPITAL_COMMUNITY)
Admission: RE | Admit: 2023-12-18 | Discharge: 2023-12-18 | Disposition: A | Discharge status: Home / Self Care | Payer: Medicare Other | Source: Ambulatory Visit | Attending: Cardiology | Admitting: Cardiology

## 2023-12-18 VITALS — BP 120/68 | HR 75 | Wt 222.2 lb

## 2023-12-18 DIAGNOSIS — I480 Paroxysmal atrial fibrillation: Secondary | ICD-10-CM

## 2023-12-18 DIAGNOSIS — Z86711 Personal history of pulmonary embolism: Secondary | ICD-10-CM | POA: Insufficient documentation

## 2023-12-18 DIAGNOSIS — Z6838 Body mass index (BMI) 38.0-38.9, adult: Secondary | ICD-10-CM | POA: Insufficient documentation

## 2023-12-18 DIAGNOSIS — Z79899 Other long term (current) drug therapy: Secondary | ICD-10-CM | POA: Insufficient documentation

## 2023-12-18 DIAGNOSIS — E669 Obesity, unspecified: Secondary | ICD-10-CM | POA: Insufficient documentation

## 2023-12-18 DIAGNOSIS — Z7901 Long term (current) use of anticoagulants: Secondary | ICD-10-CM | POA: Insufficient documentation

## 2023-12-18 DIAGNOSIS — I272 Pulmonary hypertension, unspecified: Secondary | ICD-10-CM | POA: Insufficient documentation

## 2023-12-18 DIAGNOSIS — I4891 Unspecified atrial fibrillation: Secondary | ICD-10-CM | POA: Diagnosis not present

## 2023-12-18 DIAGNOSIS — I5032 Chronic diastolic (congestive) heart failure: Secondary | ICD-10-CM | POA: Insufficient documentation

## 2023-12-18 LAB — MULTIPLE MYELOMA PANEL, SERUM
Albumin SerPl Elph-Mcnc: 3.2 g/dL (ref 2.9–4.4)
Albumin/Glob SerPl: 0.9 (ref 0.7–1.7)
Alpha 1: 0.3 g/dL (ref 0.0–0.4)
Alpha2 Glob SerPl Elph-Mcnc: 0.5 g/dL (ref 0.4–1.0)
B-Globulin SerPl Elph-Mcnc: 1.1 g/dL (ref 0.7–1.3)
Gamma Glob SerPl Elph-Mcnc: 1.7 g/dL (ref 0.4–1.8)
Globulin, Total: 3.6 g/dL (ref 2.2–3.9)
IgA: 653 mg/dL — ABNORMAL HIGH (ref 64–422)
IgG (Immunoglobin G), Serum: 1591 mg/dL (ref 586–1602)
IgM (Immunoglobulin M), Srm: 192 mg/dL (ref 26–217)
M Protein SerPl Elph-Mcnc: 0.4 g/dL — ABNORMAL HIGH
Total Protein ELP: 6.8 g/dL (ref 6.0–8.5)

## 2023-12-18 MED ORDER — SPIRONOLACTONE 25 MG PO TABS: 25.0000 mg | ORAL_TABLET | Freq: Every day | ORAL | 5 refills | Status: DC

## 2023-12-18 MED ORDER — FUROSEMIDE 40 MG PO TABS: ORAL_TABLET | ORAL | 3 refills | Status: DC

## 2023-12-18 NOTE — Patient Instructions (Signed)
 Medication Changes:  INCREASE LASIX  (FUROSEMIDE ) TO 80MG  IN THE MORNING AND 40MG  IN THE EVENING  INCREASE SPIRONOLACTONE  TO 25MG  ONCE DAILY  Lab Work:  RETURN FOR LABS IN 2 WEEKS AS SCHEDULED   Follow-Up in: IN 2 MONTHS PLEASE CALL OUR OFFICE AROUND MARCH TO GET SCHEDULED FOR YOUR APPOINTMENT. PHONE NUMBER IS [phone] OPTION 2   At the [facility], you and your health needs are our priority. We have a designated team specialized in the treatment of Heart Failure. This Care Team includes your primary Heart Failure Specialized Cardiologist (physician), Advanced Practice Providers (APPs- Physician Assistants and Nurse Practitioners), and Pharmacist who all work together to provide you with the care you need, when you need it.   You may see any of the following providers on your designated Care Team at your next follow up:  Dr. Jules Oar Dr. Peder Bourdon Dr. Alwin Baars Dr. Judyth Nunnery Nieves Bars, NP Ruddy Corral, [place] [facility] [place], [place] Dennise Fitz, NP [place] Lee, NP Luster Salters, PharmD   Please be sure to bring in all your medications bottles to every appointment.   Need to Contact Us :  If you have any questions or concerns before your next appointment please send us  a message through [place] or call our office at [phone].    TO LEAVE A MESSAGE FOR THE NURSE SELECT OPTION 2, PLEASE LEAVE A MESSAGE INCLUDING: YOUR NAME DATE OF BIRTH CALL BACK NUMBER REASON FOR CALL**this is important as we prioritize the call backs  YOU WILL RECEIVE A CALL BACK THE SAME DAY AS LONG AS YOU CALL BEFORE 4:00 PM

## 2023-12-18 NOTE — Progress Notes (Signed)
   PULMONARY HYPERTENSION FOLLOW UP CLINIC NOTE  Referring Physician: Azalia Leo, MD  Primary Care: Azalia Leo, MD Primary Cardiologist:  HPI: Carrie Guzman is a 78 y.o. female with a PMH of lymphedema, thrombocytopenia, pericardial effusion who presents for follow up of pulmonary hypertension.      Patient had no previous cardiac history but was admitted to the hospital in August with concerns of shortness of breath ongoing for 2-3 weeks. Patient was diagnosed with CHF exacerbation. Echocardiogram showed preserved EF with elevated PASP, cor pulmonale and pericardial effusion without tamponade. CTA chest showed acute pulmonary embolism and right side heart strain. She was discharged with diuretics and seen in [facility] clinic for follow up.   She underwent RHC on 10/2023 that showed RA: 10, PA: 94/25 (48), PCWP 15, Fick CO/CI 6.27/3.16, PVR 5.27.   Patient was started on sildenafil  20 mg 3 times daily.  She presented to routine lab visit with new atrial fibrillation with RVR.  Cardioversion was arranged which was successfully completed after anticoagulation on 12/01/2023.     SUBJECTIVE:  Patient still feels well, though still has some extra fluid after the atrial fibrillation.  She remains in sinus rhythm and otherwise has no complaints today.  She is started taking the sildenafil  and has been tolerating it well, though her blood pressure is mildly elevated in the mornings before taking her medications.  PMH, current medications, allergies, social history, and family history reviewed in epic.  PHYSICAL EXAM: Vitals:   12/18/23 0914  BP: 120/68  Pulse: 75  SpO2: 94%    GENERAL: Chronically ill-appearing Lungs- Diminished breath sounds, normal WOB CARDIAC:  JVP: Moderately elevated          Normal rate and rhythm, prominent P2, no systolic murmur.  ABDOMEN: Soft, non-tender, non-distended EXTREMITIES: Extensive lymphedema, improved from prior NEUROLOGIC: Patient is oriented  x3 with no focal or lateralizing neurologic deficits.  SKIN: Warm and dry; no lesions or wounds.   DATA REVIEW  ECG: Sinus rhythm, right bundle branch block, frequent PACs.    ECHO: Severely dilated RV, elevated RVSP, near complete obliteration of LV, underfilled.  CATH: 10/2023: RHC with RA: 10, PA 94/25 (48), PCWP 15, Fick CO/CI 6.27/3.16, PVR 5.27.  Etiology of PH: Primarily group I WHO Functional class: III : at next visit Volume status:  Mildly hypervolemic Pulmonary vasodilators: Sildenafil   Work up: [employer] & Orders: Lab workup completed previously and normal Imaging: V/Q scan negative Functional: PFTs consistent with restrictive process Hemodynamics: RHC   ASSESSMENT & PLAN:  Pulmonary hypertension: RHC as above, patient with predominant precapillary PH with elevated PVR. V/Q scan negative. No sleep apnea, and based on CT scan imaging does not have extensive pulmonary disease. Tolerating sildenfil well, but given recent afib exacerbation will increase diuretics prior to increasing. Will plan on starting uptraiv when dry.  - Continue sildenafil  20mg  TID - Increase lasix  to 80mg /40mg  BID - 6 minute walk at next visit - Continue metoprolol  25mg  succinate daily - Follow up in 2 months, 6 minute walk at that time - Continue apixaban  5mg  BID - Increase spironolactone  to 25mg  daily - BMP in 2 weeks  Atrial fibrillation: Recent cardioversion 11/2023. In normal sinus rhythm today. - Continue apixaban  5mg  BID  Obesity: Body mass index is 38.14 kg/m. - Consider GLP-1 after diuresis   Arta Lark, MD Advanced Heart Failure Mechanical Circulatory Support 12/18/23

## 2023-12-20 ENCOUNTER — Other Ambulatory Visit (HOSPITAL_COMMUNITY): Payer: Medicare Other

## 2023-12-21 LAB — PTH-RELATED PEPTIDE: PTH-related peptide: <2 pmol/L

## 2024-01-01 ENCOUNTER — Other Ambulatory Visit (HOSPITAL_COMMUNITY): Payer: Medicare Other

## 2024-01-02 ENCOUNTER — Ambulatory Visit (HOSPITAL_COMMUNITY)
Admission: RE | Admit: 2024-01-02 | Discharge: 2024-01-02 | Disposition: A | Discharge status: Home / Self Care | Payer: Medicare Other | Source: Ambulatory Visit | Attending: Cardiology | Admitting: Cardiology

## 2024-01-02 DIAGNOSIS — I5032 Chronic diastolic (congestive) heart failure: Secondary | ICD-10-CM | POA: Insufficient documentation

## 2024-01-02 LAB — BASIC METABOLIC PANEL
Anion gap: 7 (ref 5–15)
BUN: 14 mg/dL (ref 8–23)
CO2: 29 mmol/L (ref 22–32)
Calcium: 10.8 mg/dL — ABNORMAL HIGH (ref 8.9–10.3)
Chloride: 103 mmol/L (ref 98–111)
Creatinine, Ser: 0.86 mg/dL (ref 0.44–1.00)
GFR, Estimated: >60 mL/min (ref >60)
Glucose, Bld: 132 mg/dL — ABNORMAL HIGH (ref 70–99)
Potassium: 3.7 mmol/L (ref 3.5–5.1)
Sodium: 139 mmol/L (ref 135–145)

## 2024-01-02 LAB — BRAIN NATRIURETIC PEPTIDE: B Natriuretic Peptide: 373.7 pg/mL — ABNORMAL HIGH (ref 0.0–100.0)

## 2024-01-03 ENCOUNTER — Encounter (HOSPITAL_COMMUNITY): Payer: Self-pay | Admitting: Cardiology

## 2024-01-05 ENCOUNTER — Telehealth: Payer: Medicare Other | Admitting: Hematology

## 2024-01-12 ENCOUNTER — Telehealth (HOSPITAL_COMMUNITY): Payer: Self-pay | Admitting: Cardiology

## 2024-01-12 MED ORDER — METOPROLOL SUCCINATE ER 25 MG PO TB24: 50.0000 mg | ORAL_TABLET | Freq: Every day | ORAL | 5 refills | Status: DC

## 2024-01-12 NOTE — Telephone Encounter (Signed)
 Pt aware voiced understanding Add on 3/10 @ 840 Meds updated

## 2024-01-12 NOTE — Telephone Encounter (Signed)
 Sounds like she may be back in afib with RVR. Increase metoprolol to 50 mg daily. Needs urgent follow-up next week. If feeling poorly over weekend with sustained tachycardia should go to ED

## 2024-01-12 NOTE — Telephone Encounter (Signed)
 Patient called to report HR elevated at 136 and mildly SOB  Would like to know what she could do to decrease HR  No additional symptoms   Per 12/18/23 OV Atrial fibrillation: Recent cardioversion 11/2023. In normal sinus rhythm today.  HR 75   Please advise

## 2024-01-15 ENCOUNTER — Ambulatory Visit (HOSPITAL_COMMUNITY)
Admission: RE | Admit: 2024-01-15 | Discharge: 2024-01-15 | Disposition: A | Discharge status: Home / Self Care | Source: Ambulatory Visit | Attending: Cardiology | Admitting: Cardiology

## 2024-01-15 VITALS — BP 114/64 | HR 61 | Wt 190.0 lb

## 2024-01-15 DIAGNOSIS — Z7901 Long term (current) use of anticoagulants: Secondary | ICD-10-CM | POA: Insufficient documentation

## 2024-01-15 DIAGNOSIS — I272 Pulmonary hypertension, unspecified: Secondary | ICD-10-CM | POA: Insufficient documentation

## 2024-01-15 DIAGNOSIS — Z79899 Other long term (current) drug therapy: Secondary | ICD-10-CM | POA: Diagnosis not present

## 2024-01-15 DIAGNOSIS — I4891 Unspecified atrial fibrillation: Secondary | ICD-10-CM | POA: Insufficient documentation

## 2024-01-15 DIAGNOSIS — Z86711 Personal history of pulmonary embolism: Secondary | ICD-10-CM | POA: Diagnosis not present

## 2024-01-15 DIAGNOSIS — Z6832 Body mass index (BMI) 32.0-32.9, adult: Secondary | ICD-10-CM | POA: Insufficient documentation

## 2024-01-15 DIAGNOSIS — I480 Paroxysmal atrial fibrillation: Secondary | ICD-10-CM | POA: Diagnosis not present

## 2024-01-15 DIAGNOSIS — I5032 Chronic diastolic (congestive) heart failure: Secondary | ICD-10-CM | POA: Insufficient documentation

## 2024-01-15 DIAGNOSIS — E669 Obesity, unspecified: Secondary | ICD-10-CM | POA: Diagnosis not present

## 2024-01-15 DIAGNOSIS — I491 Atrial premature depolarization: Secondary | ICD-10-CM | POA: Insufficient documentation

## 2024-01-15 MED ORDER — FUROSEMIDE 40 MG PO TABS: 80.0000 mg | ORAL_TABLET | Freq: Every day | ORAL | 3 refills | Status: DC

## 2024-01-15 MED ORDER — SILDENAFIL CITRATE 20 MG PO TABS: 40.0000 mg | ORAL_TABLET | Freq: Three times a day (TID) | ORAL | 8 refills | Status: DC

## 2024-01-15 MED ORDER — SOTALOL HCL 80 MG PO TABS: 80.0000 mg | ORAL_TABLET | Freq: Two times a day (BID) | ORAL | 3 refills | Status: DC

## 2024-01-15 NOTE — Progress Notes (Signed)
 PULMONARY HYPERTENSION FOLLOW UP CLINIC NOTE  Referring Physician: Melida Quitter, MD  Primary Care: Melida Quitter, MD Primary Cardiologist:   HPI: Carrie Guzman is a 78 y.o. female with a PMH of lymphedema, thrombocytopenia, pericardial effusion who presents for follow up of pulmonary hypertension.      Patient had no previous cardiac history but was admitted to the hospital in August with concerns of shortness of breath ongoing for 2-3 weeks. Patient was diagnosed with CHF exacerbation. Echocardiogram showed preserved EF with elevated PASP, cor pulmonale and pericardial effusion without tamponade. CTA chest showed acute pulmonary embolism and right side heart strain. She was discharged with diuretics and seen in [facility] clinic for follow up.   She underwent RHC on 10/2023 that showed RA: 10, PA: 94/25 (48), PCWP 15, Fick CO/CI 6.27/3.16, PVR 5.27.   Patient was started on sildenafil 20 mg 3 times daily.  She presented to routine lab visit with new atrial fibrillation with RVR.  Cardioversion was arranged which was successfully completed after anticoagulation on 12/01/2023.  Called back in 3/7 with recurrent tachycardia and follow up was made.      SUBJECTIVE:  Has lost a significant amount of weight on the increased doses of lasix.  She reported feeling better after taking the evening dose of metoprolol, and her EKG shows sinus rhythm in clinic today.  She has been urinating constantly and is lost about 30 pounds.  She has been breathing much better and was even able to walk around for 45 minutes at [employer].  She has been progressively increasing her walking distance in the morning, and is now up to walking 7 minutes at a time twice a day.  PMH, current medications, allergies, social history, and family history reviewed in epic.  PHYSICAL EXAM: Vitals:   01/15/24 0838  BP: 114/64  Pulse: 61  SpO2: 92%     GENERAL: Chronically ill-appearing Lungs- Diminished breath  sounds, normal WOB CARDIAC:  JVP: Moderately elevated          Normal rate and rhythm, no systolic murmur.  ABDOMEN: Soft, non-tender, non-distended EXTREMITIES: Extensive lymphedema, improved from prior NEUROLOGIC: Patient is oriented x3 with no focal or lateralizing neurologic deficits.    DATA REVIEW  ECG: Sinus rhythm, right bundle branch block, frequent PACs.    ECHO: Severely dilated RV, elevated RVSP, near complete obliteration of LV, underfilled.  CATH: 10/2023: RHC with RA: 10, PA 94/25 (48), PCWP 15, Fick CO/CI 6.27/3.16, PVR 5.27.  Etiology of PH: Primarily group I WHO Functional class: III : 182.78m Volume status:  Mildly hypervolemic Pulmonary vasodilators: Sildenafil  Work up: [employer] & Orders: Lab workup completed previously and normal Imaging: V/Q scan negative, sleep study negative Functional: PFTs consistent with restrictive process Hemodynamics: RHC as above   ASSESSMENT & PLAN:  Pulmonary hypertension: RHC as above, patient with predominant precapillary PH with elevated PVR. V/Q scan negative. No sleep apnea, and based on CT scan imaging does not have extensive pulmonary disease. Increase sildenafil today to 40mg  TID, reduce diuretics. Uptravi at next visit.  - Increase sildenafil to 40mg  TID - Decrease lasix to 80mg  daily - 6 minute walk ordered - Transition to sotalol as below - Continue apixaban 5mg  BID - Continue spironolactone 25mg  daily - BMP, mag at next visit  Atrial fibrillation: Cardioversion 11/2023. EKG today with normal sinus rhythm.  Given recurrent episodes of sytmpomatic afib at home we will start patient on sotalol. -Stop metoprolol, start sotalol 80 mg twice  daily -Labs at next visit - Continue apixaban 5mg  BID  Obesity: Body mass index is 32.61 kg/m. - Consider GLP-1 after diuresis, significantly improved though currently   Carrie Hasten, MD Advanced Heart Failure Mechanical Circulatory Support 01/15/24

## 2024-01-15 NOTE — Progress Notes (Signed)
 6 Min Walk Test Completed  Pt ambulated 641ft (182.8 m) O2 Sat ranged 92%-90% on room air HR ranged 70-103

## 2024-01-15 NOTE — Patient Instructions (Signed)
 STOP Metoprolol  START Sotalol 80 mg Twice daily  CHANGE Lasix to 80 mg daily.  INCREASE Sildenafil to 40 mg Three times a day   Your physician recommends that you schedule a follow-up appointment in: 3 months (June) ** PLEASE CALL THE OFFICE IN APRIL TO ARRANGE YOUR FOLLOW UP APPOINTMENT.**  If you have any questions or concerns before your next appointment please send [place] a message through [place] or call our office at [phone].    TO LEAVE A MESSAGE FOR THE NURSE SELECT OPTION 2, PLEASE LEAVE A MESSAGE INCLUDING: YOUR NAME DATE OF BIRTH CALL BACK NUMBER REASON FOR CALL**this is important as we prioritize the call backs  YOU WILL RECEIVE A CALL BACK THE SAME DAY AS LONG AS YOU CALL BEFORE 4:00 PM  At the [facility], you and your health needs are our priority. As part of our continuing mission to provide you with exceptional heart care, we have created designated Provider Care Teams. These Care Teams include your primary Cardiologist (physician) and Advanced Practice Providers (APPs- Physician Assistants and Nurse Practitioners) who all work together to provide you with the care you need, when you need it.   You may see any of the following providers on your designated Care Team at your next follow up: Dr Arvilla Meres Dr Marca Ancona Dr. Dorthula Nettles Dr. [name] Amy Filbert Schilder, NP Robbie Lis, [facility] [place], [place] Brynda Peon, NP [place] Lee, NP Clarisa Kindred, NP Karle Plumber, PharmD Enos Fling, PharmD   Please be sure to bring in all your medications bottles to every appointment.    Thank you for choosing [facility]

## 2024-01-16 ENCOUNTER — Ambulatory Visit (HOSPITAL_COMMUNITY)
Admission: RE | Admit: 2024-01-16 | Discharge: 2024-01-16 | Disposition: A | Discharge status: Home / Self Care | Payer: Medicare Other | Source: Ambulatory Visit | Attending: Hematology | Admitting: Hematology

## 2024-01-22 ENCOUNTER — Inpatient Hospital Stay: Payer: Medicare Other | Attending: Hematology | Admitting: Hematology

## 2024-01-22 DIAGNOSIS — Z87891 Personal history of nicotine dependence: Secondary | ICD-10-CM | POA: Insufficient documentation

## 2024-01-22 DIAGNOSIS — D696 Thrombocytopenia, unspecified: Secondary | ICD-10-CM | POA: Insufficient documentation

## 2024-01-22 DIAGNOSIS — Z7901 Long term (current) use of anticoagulants: Secondary | ICD-10-CM | POA: Diagnosis not present

## 2024-01-22 DIAGNOSIS — Z79899 Other long term (current) drug therapy: Secondary | ICD-10-CM | POA: Insufficient documentation

## 2024-01-22 DIAGNOSIS — Z86711 Personal history of pulmonary embolism: Secondary | ICD-10-CM | POA: Insufficient documentation

## 2024-01-22 DIAGNOSIS — I2609 Other pulmonary embolism with acute cor pulmonale: Secondary | ICD-10-CM | POA: Diagnosis not present

## 2024-01-22 NOTE — Progress Notes (Signed)
 HEMATOLOGY/ONCOLOGY CLINIC VISIT NOTE  Date of Service: 01/22/2024  Patient Care Team: Melida Quitter, MD as PCP - General (Internal Medicine) Quintella Reichert, MD as PCP - Cardiology (Cardiology) Janalyn Harder, MD (Inactive) as Consulting Physician (Dermatology) Johney Maine, MD as Consulting Physician (Hematology)  CHIEF COMPLAINTS/PURPOSE OF CONSULTATION:  Thrombocytopenia and Pulmonary embolism   HISTORY OF PRESENTING ILLNESS:    Carrie Guzman is a wonderful 78 y.o. female who has been referred to [place] by attending Dr. Stephania Fragmin, MD, for evaluation and management of Thrombocytopenia. Patient's lab shows platelets have dropped from 111 K, on 06/21/2023, to 65 K today, 06/23/2023.    Patient has a history of chronic lower extremity lymphedema/venous insufficiency, hypertension, morbid obesity .Body mass index is 46.31 kg/m.  Who was seen as outpatient for 2 weeks ago for upper respiratory infection and had testing which showed she was COVID-negative.  Patient notes she was treated with steroids. She presented again to [facility] with increasing shortness of breath and was noted to have heart rate in the 50s and oxygen saturation of 86% with some decreased mentation per her husband. Patient was initially thought to have heart failure and was started on diuretics and had an echo which showed severe pulmonary hypertension and right-sided cor pulmonale. Subsequent CTA of the chest showed significant right upper lobe segmental and subsegmental pulmonary emboli with evidence of right heart strain.  Cardiomegaly with moderate to large pericardial effusion.   Patient is thought to have obstructive sleep apnea/obesity related hypoventilation at baseline that is undiagnosed might be causing some element of pulmonary hypertension.    She was started on IV heparin.  She was noted to have elevated bilirubin level on admission likely due to hepatic congestion this is  improving. She was also noted to have slight thrombocytopenia of 111k on admission.  Platelet counts today dropped to 65k will consulted to evaluate her thrombocytopenia. No other recent hospitalization blood counts were available for comparison to determine if her thrombocytopenia is chronic. Patient denies any bleeding issues.  No nosebleeds no gum bleeds hematuria or hematochezia. Denies any previous known history of thrombocytopenia. Has been taking Tylenol arthritis for joint issues.  Denies significant other NSAID use. Husband was at bedside during this hospital  interview.  INTERVAL HISTORY:  Carrie Guzman is a wonderful 78 y.o. female who is here for continued evaluation and management of Thrombocytopenia and Pulmonary embolism.   .I connected with Lynnell Catalan on 01/22/2024 at  3:30 PM EDT by telephone visit and verified that I am speaking with the correct person using two identifiers.   Patient was last seen by me on 12/13/2023 and she complained of mild SOB upon exertion, one episode of epistaxis, and bilateral upper arm discomfort.   Patient notes she has been doing well overall since our last visit. She denies any new infection issues, fever, chills, night sweats, unexpected weight loss, back pain, chest pain, SOB, or leg swelling.   She had a recent follow-up with her Cardiologist, who did not change any medication or treatment.   Discussed additional lab results from 12/13/2023 in detail with the patient.    I discussed the limitations, risks, security and privacy concerns of performing an evaluation and management service by telemedicine and the availability of in-person appointments. I also discussed with the patient that there may be a patient responsible charge related to this service. The patient expressed understanding and agreed to proceed.   Other persons  participating in the visit and their role in the encounter: None   Patient's location: Home   Provider's location: [facility]   Chief Complaint: Thrombocytopenia and Pulmonary embolism.     MEDICAL HISTORY:  Past Medical History:  Diagnosis Date   CHF (congestive heart failure) (HCC)    Elevated brain natriuretic peptide (BNP) level    Elevated cholesterol    Heart murmur    HLD (hyperlipidemia)    Hypertension    Lower extremity cellulitis    Lymphedema of lower extremity    Psoriasis    Squamous cell carcinoma of skin 05/12/2021   Left Lower Leg - anterior (in situ) (curet and 5FU)    SURGICAL HISTORY: Past Surgical History:  Procedure Laterality Date   ABDOMINAL HERNIA REPAIR     ABDOMINAL HYSTERECTOMY     ANKLE FRACTURE SURGERY     CARDIOVERSION N/A 12/01/2023   Procedure: CARDIOVERSION;  Surgeon: Romie Minus, MD;  Location: [facility] INVASIVE CV LAB;  Service: Cardiovascular;  Laterality: N/A;   CATARACT EXTRACTION     CHOLECYSTECTOMY     RIGHT HEART CATH N/A 10/10/2023   Procedure: RIGHT HEART CATH;  Surgeon: Romie Minus, MD;  Location: [facility] INVASIVE CV LAB;  Service: Cardiovascular;  Laterality: N/A;    SOCIAL HISTORY: Social History   Socioeconomic History   Marital status: Married    Spouse name: Not on file   Number of children: Not on file   Years of education: Not on file   Highest education level: Not on file  Occupational History   Not on file  Tobacco Use   Smoking status: Former    Types: Cigarettes    Passive exposure: Never   Smokeless tobacco: Never  Vaping Use   Vaping status: Never Used  Substance and Sexual Activity   Alcohol use: Never   Drug use: Never   Sexual activity: Not on file  Other Topics Concern   Not on file  Social History Narrative   Not on file   Social Drivers of Health   Financial Resource Strain: Not on file  Food Insecurity: No Food Insecurity (06/21/2023)   Hunger Vital Sign    Worried About Running Out of Food in the Last Year: Never true    Ran Out of Food in the Last Year: Never true   Transportation Needs: No Transportation Needs (06/21/2023)   PRAPARE - Administrator, Civil Service (Medical): No    Lack of Transportation (Non-Medical): No  Physical Activity: Not on file  Stress: Not on file  Social Connections: Not on file  Intimate Partner Violence: Not At Risk (06/21/2023)   Humiliation, Afraid, Rape, and Kick questionnaire    Fear of Current or Ex-Partner: No    Emotionally Abused: No    Physically Abused: No    Sexually Abused: No    FAMILY HISTORY: Family History  Problem Relation Age of Onset   Hypertension Father    Heart attack Father    Psoriasis Father    Diabetes Maternal Grandfather     ALLERGIES:  has no known allergies.  MEDICATIONS:  Current Outpatient Medications  Medication Sig Dispense Refill   acetaminophen (TYLENOL) 650 MG CR tablet Take 1,300 mg by mouth every 8 (eight) hours as needed for pain.     albuterol (VENTOLIN HFA) 108 (90 Base) MCG/ACT inhaler Inhale 2 puffs into the lungs every 6 (six) hours as needed for wheezing or shortness of breath. 8 g 6   apixaban (  ELIQUIS) 5 MG TABS tablet Take 1 tablet (5 mg total) by mouth 2 (two) times daily. Please request future refills from Dr. Wynona Neat with pulmonology. 60 tablet 1   furosemide (LASIX) 40 MG tablet Take 2 tablets (80 mg total) by mouth daily. 90 tablet 3   Menthol, Topical Analgesic, (BIOFREEZE EX) Apply 1 Application topically as needed (pain).     Multiple Vitamins-Minerals (CENTRUM SILVER 50+WOMEN) TABS Take 1 tablet by mouth daily.     OVER THE COUNTER MEDICATION Take 1 tablet by mouth daily at 2 am. smooth move     potassium chloride (KLOR-CON) 10 MEQ tablet Take 2 tablets (20 mEq total) by mouth daily. 60 tablet 6   rosuvastatin (CRESTOR) 10 MG tablet Take 10 mg by mouth daily.     sildenafil (REVATIO) 20 MG tablet Take 2 tablets (40 mg total) by mouth 3 (three) times daily. 180 tablet 8   sotalol (BETAPACE) 80 MG tablet Take 1 tablet (80 mg total) by mouth 2  (two) times daily. 180 tablet 3   spironolactone (ALDACTONE) 25 MG tablet Take 1 tablet (25 mg total) by mouth daily. 30 tablet 5   triamcinolone (KENALOG) 0.025 % ointment Apply 1 Application topically daily as needed (irritation).     No current facility-administered medications for this visit.    REVIEW OF SYSTEMS:    10 Point review of Systems was done is negative except as noted above.   PHYSICAL EXAMINATION:  Telemedicine visit  LABORATORY DATA:  I have reviewed the data as listed  .    Latest Ref Rng & Units 12/13/2023    8:25 AM 11/23/2023   12:00 PM 10/10/2023   12:42 PM  CBC  WBC 4.0 - 10.5 K/uL 4.5  5.2    Hemoglobin 12.0 - 15.0 g/dL 40.9  81.1  91.4   Hematocrit 36.0 - 46.0 % 39.6  39.0  34.0   Platelets 150 - 400 K/uL 133  139      .    Latest Ref Rng & Units 01/02/2024    8:35 AM 12/13/2023    9:29 AM 12/13/2023    8:25 AM  CMP  Glucose 70 - 99 mg/dL 782   956   BUN 8 - 23 mg/dL 14   17   Creatinine 2.13 - 1.00 mg/dL 0.86   5.78   Sodium 469 - 145 mmol/L 139   141   Potassium 3.5 - 5.1 mmol/L 3.7   3.9   Chloride 98 - 111 mmol/L 103   105   CO2 22 - 32 mmol/L 29   31   Calcium 8.9 - 10.3 mg/dL 62.9  52.8  41.3   Total Protein 6.5 - 8.1 g/dL   7.1   Total Bilirubin 0.0 - 1.2 mg/dL   1.9   Alkaline Phos 38 - 126 U/L   73   AST 15 - 41 U/L   19   ALT 0 - 44 U/L   9      RADIOGRAPHIC STUDIES: I have personally reviewed the radiological images as listed and agreed with the findings in the report. No results found.   ASSESSMENT & PLAN:  78 year old female with a history of morbid obesity, possible CHF, hypertension, dyslipidemia with   #1 significant right upper lobe segmental and subsegmental pulmonary embolism with right heart strain.   #2 severe pulmonary hypertension thought to be related to PE but also due to likely undiagnosed OSA/OHS diastolic CHF.   #3 moderate to large  pericardial effusion-being evaluated by cardiology.   #4 Thrombocytopenia  in the context of large PE, recent URI recent use of heparin. No overt platelet clumping Could also be related to recent viral URI. Could also be from significant hepatic congestion in the context of increased venous pressures Dilutional due to volume overload  #5 Hypercalcemia PLAN:  - Bone survey results pending from 01/16/2024 -Discussed additional lab results from 12/13/2023 in detail with the patient. LDH of 129.Vitamin-D level of 38.5. Angiotensin-converting enzyme level in the normal range of 71. Calcium levels elevated at 5.9. PTH-related peptide in the normal range.  -Multiple myeloma panel results from 12/13/2023 showed M-protein of 0.4.  -RTC with labs in 6 months. -continue blood thinners for blood clot prevention as well as due to Atrial fibrillation, -Answered all of patient's questions.   FOLLOW-UP: Phone visit with Dr Candise Che in 6 months  Labs 2 weeks prior to phone visit  The total time spent in the appointment was 20 minutes* .  All of the patient's questions were answered with apparent satisfaction. The patient knows to call the clinic with any problems, questions or concerns.   Wyvonnia Lora MD MS AAHIVMS [facility] Hematology/Oncology Physician [facility]  .*Total Encounter Time as defined by the Centers for Medicare and Medicaid Services includes, in addition to the face-to-face time of a patient visit (documented in the note above) non-face-to-face time: obtaining and reviewing outside history, ordering and reviewing medications, tests or procedures, care coordination (communications with other health care professionals or caregivers) and documentation in the medical record.   I,Param Shah,acting as a Neurosurgeon for Wyvonnia Lora, MD.,have documented all relevant documentation on the behalf of Wyvonnia Lora, MD,as directed by  Wyvonnia Lora, MD while in the presence of Wyvonnia Lora, MD.  .I have reviewed the above documentation for accuracy and completeness, and I  agree with the above. Johney Maine MD

## 2024-01-23 ENCOUNTER — Telehealth: Payer: Self-pay | Admitting: Hematology

## 2024-01-23 NOTE — Telephone Encounter (Signed)
 Spoke with patient confirming upcoming appointment

## 2024-01-26 ENCOUNTER — Telehealth (HOSPITAL_COMMUNITY): Payer: Self-pay | Admitting: Cardiology

## 2024-01-26 NOTE — Telephone Encounter (Signed)
 Front office left patient a voice message on her mobile telephone. Patient is due for a 2 mo f/u appt with Dr. Elwyn Lade in April 2025.

## 2024-02-13 ENCOUNTER — Other Ambulatory Visit (HOSPITAL_COMMUNITY): Payer: Self-pay

## 2024-02-13 ENCOUNTER — Encounter (HOSPITAL_COMMUNITY): Payer: Self-pay | Admitting: Cardiology

## 2024-02-13 ENCOUNTER — Telehealth (HOSPITAL_COMMUNITY): Payer: Self-pay | Admitting: Pharmacist

## 2024-02-13 ENCOUNTER — Ambulatory Visit (HOSPITAL_COMMUNITY)
Admission: RE | Admit: 2024-02-13 | Discharge: 2024-02-13 | Disposition: A | Discharge status: Home / Self Care | Source: Ambulatory Visit | Attending: Cardiology | Admitting: Cardiology

## 2024-02-13 VITALS — BP 122/70 | HR 54 | Wt 186.6 lb

## 2024-02-13 DIAGNOSIS — I480 Paroxysmal atrial fibrillation: Secondary | ICD-10-CM | POA: Diagnosis not present

## 2024-02-13 DIAGNOSIS — E669 Obesity, unspecified: Secondary | ICD-10-CM | POA: Diagnosis not present

## 2024-02-13 DIAGNOSIS — Z86711 Personal history of pulmonary embolism: Secondary | ICD-10-CM | POA: Insufficient documentation

## 2024-02-13 DIAGNOSIS — I5032 Chronic diastolic (congestive) heart failure: Secondary | ICD-10-CM

## 2024-02-13 DIAGNOSIS — Z7901 Long term (current) use of anticoagulants: Secondary | ICD-10-CM | POA: Diagnosis not present

## 2024-02-13 DIAGNOSIS — I4891 Unspecified atrial fibrillation: Secondary | ICD-10-CM | POA: Diagnosis not present

## 2024-02-13 DIAGNOSIS — I890 Lymphedema, not elsewhere classified: Secondary | ICD-10-CM | POA: Diagnosis not present

## 2024-02-13 DIAGNOSIS — I4510 Unspecified right bundle-branch block: Secondary | ICD-10-CM | POA: Insufficient documentation

## 2024-02-13 DIAGNOSIS — Z79899 Other long term (current) drug therapy: Secondary | ICD-10-CM | POA: Insufficient documentation

## 2024-02-13 DIAGNOSIS — I509 Heart failure, unspecified: Secondary | ICD-10-CM | POA: Insufficient documentation

## 2024-02-13 DIAGNOSIS — I272 Pulmonary hypertension, unspecified: Secondary | ICD-10-CM | POA: Insufficient documentation

## 2024-02-13 DIAGNOSIS — Z6832 Body mass index (BMI) 32.0-32.9, adult: Secondary | ICD-10-CM | POA: Diagnosis not present

## 2024-02-13 NOTE — Progress Notes (Signed)
 PULMONARY HYPERTENSION FOLLOW UP CLINIC NOTE  Referring Physician: Melida Quitter, MD  Primary Care: Melida Quitter, MD Primary Cardiologist:   HPI: Carrie Guzman is a 78 y.o. female with a PMH of lymphedema, thrombocytopenia, pericardial effusion who presents for follow up of pulmonary hypertension.      Patient had no previous cardiac history but was admitted to the hospital in August with concerns of shortness of breath ongoing for 2-3 weeks. Patient was diagnosed with CHF exacerbation. Echocardiogram showed preserved EF with elevated PASP, cor pulmonale and pericardial effusion without tamponade. CTA chest showed acute pulmonary embolism and right side heart strain. She was discharged with diuretics and seen in [facility] clinic for follow up.   She underwent RHC on 10/2023 that showed RA: 10, PA: 94/25 (48), PCWP 15, Fick CO/CI 6.27/3.16, PVR 5.27.   Patient was started on sildenafil 20 mg 3 times daily.  She presented to routine lab visit with new atrial fibrillation with RVR.  Cardioversion was arranged which was successfully completed after anticoagulation on 12/01/2023.  Called back in 3/7 with recurrent tachycardia and follow up was made.   600 feet on 01/15/24.      SUBJECTIVE:  She overall feels great. Is having no issues tolerating the increased sildeanafil dose or the sotalol. She went to two stores the other day and was able to walk around for an hour and a half. Continues to lose a significant amount of weight. Denies any dizziness or presyncopal symptoms.   PMH, current medications, allergies, social history, and family history reviewed in epic.  PHYSICAL EXAM: Vitals:   02/13/24 0839  BP: 122/70  Pulse: (!) 54  SpO2: 95%      GENERAL: Chronically ill-appearing Lungs- Diminished breath sounds, normal WOB CARDIAC:  JVP: Mildly elevated          Bradycardic, regular rate ABDOMEN: Soft, non-tender, non-distended EXTREMITIES: Extensive lymphedema, continues  to improve, leg wraps in place NEUROLOGIC: Patient is oriented x3 with no focal or lateralizing neurologic deficits.    DATA REVIEW  ECG: Sinus rhythm, right bundle branch block, frequent PACs.    ECHO: Severely dilated RV, elevated RVSP, near complete obliteration of LV, underfilled.  CATH: 10/2023: RHC with RA: 10, PA 94/25 (48), PCWP 15, Fick CO/CI 6.27/3.16, PVR 5.27.  Etiology of PH: Primarily group I WHO Functional class: III : 182.49m Volume status:  Mildly hypervolemic Pulmonary vasodilators: Sildenafil  Work up: [employer] & Orders: Lab workup completed previously and normal Imaging: V/Q scan negative, sleep study negative Functional: PFTs consistent with restrictive process Hemodynamics: RHC as above   ASSESSMENT & PLAN:  Pulmonary hypertension: RHC as above, patient with predominant precapillary PH with elevated PVR. V/Q scan negative. No sleep apnea, and based on CT scan imaging does not have extensive pulmonary disease. Continue sildenafil. - Continue sildenafil to 40mg  TID - Continue lasix 80mg  daily - Pharmacy discussion, will start selexipag. Avoiding ERA given extensive lymphedema, borderline PCWP pressure, and recent overload with atrial fibrillation - Continue sotalol 80mg  BID - Continue apixaban 5mg  BID, suspect will need lifelong anticoagulation - Continue spironolactone 25mg  daily  Atrial fibrillation: Cardioversion 11/2023. EKG today with normal sinus rhythm.  Given recurrent episodes of sytmpomatic afib at home we will start patient on sotalol. -Stop metoprolol, start sotalol 80 mg twice daily -Labs at next visit - Continue apixaban 5mg  BID  Obesity: Body mass index is 32.03 kg/m. - Consider GLP-1 after diuresis, significantly improved though currently  Hypercalcemia: Recent bone scan  negative, follows with hematology.  Pulmonary embolism: Prior, V/Q scan negative. - Follows with hematology who reports need for lifelong OAC (will need given  Afib)    [name], MD Advanced Heart Failure Mechanical Circulatory Support 02/13/24

## 2024-02-13 NOTE — Telephone Encounter (Signed)
 Patient Advocate Encounter   Received notification from [employer] that prior authorization for Carrie Guzman is required.   PA submitted on CoverMyMeds Key [id number] Status is pending   Will continue to follow.   Karle Plumber, PharmD, BCPS, BCCP, CPP Heart Failure Clinic Pharmacist [phone]

## 2024-02-13 NOTE — Telephone Encounter (Signed)
 Advanced Heart Failure Patient Advocate Encounter  Prior Authorization for Carrie Guzman has been approved.     Effective dates:  11/08/2023 - 02/12/2027  Karle Plumber, PharmD, BCPS, BCCP, CPP Heart Failure Clinic Pharmacist [phone]

## 2024-02-13 NOTE — Telephone Encounter (Signed)
 Advanced Heart Failure Patient Advocate Encounter  Prior Authorization for sildenafil has been approved (approved for Q180/30 days).    Effective dates: 11/08/2023 - 02/12/2027  Patients co-pay is $5.00/30 days

## 2024-02-13 NOTE — Patient Instructions (Signed)
 You will be contacted about your uptravi.  Your physician recommends that you schedule a follow-up appointment in: 2 months.  If you have any questions or concerns before your next appointment please send [place] a message through [place] or call our office at [phone].    TO LEAVE A MESSAGE FOR THE NURSE SELECT OPTION 2, PLEASE LEAVE A MESSAGE INCLUDING: YOUR NAME DATE OF BIRTH CALL BACK NUMBER REASON FOR CALL**this is important as we prioritize the call backs  YOU WILL RECEIVE A CALL BACK THE SAME DAY AS LONG AS YOU CALL BEFORE 4:00 PM  At the [facility], you and your health needs are our priority. As part of our continuing mission to provide you with exceptional heart care, we have created designated Provider Care Teams. These Care Teams include your primary Cardiologist (physician) and Advanced Practice Providers (APPs- Physician Assistants and Nurse Practitioners) who all work together to provide you with the care you need, when you need it.   You may see any of the following providers on your designated Care Team at your next follow up: Dr Arvilla Meres Dr Marca Ancona Dr. Dorthula Nettles Dr. [name] Amy Filbert Schilder, NP Robbie Lis, [place] [facility] [place], [place] Brynda Peon, NP [place] Lee, NP Clarisa Kindred, NP Karle Plumber, PharmD Enos Fling, PharmD   Please be sure to bring in all your medications bottles to every appointment.    Thank you for choosing [facility]

## 2024-02-13 NOTE — Telephone Encounter (Signed)
 Patient Advocate Encounter   Received notification from [employer] that prior authorization for sildenafil is required.   PA submitted on CoverMyMeds Key [id number]  Status is pending   Will continue to follow.  Karle Plumber, PharmD, BCPS, BCCP, CPP Heart Failure Clinic Pharmacist [phone]

## 2024-02-13 NOTE — Telephone Encounter (Signed)
 Sent in enrollment application to [employer] for [employer].    Application pending, will continue to follow.   Karle Plumber, PharmD, BCPS, BCCP, CPP Heart Failure Clinic Pharmacist [phone]

## 2024-02-19 ENCOUNTER — Other Ambulatory Visit (HOSPITAL_COMMUNITY): Payer: Self-pay

## 2024-02-27 ENCOUNTER — Other Ambulatory Visit (HOSPITAL_COMMUNITY): Payer: Self-pay | Admitting: Cardiology

## 2024-02-27 MED ORDER — UPTRAVI TITRATION 200 & 800 MCG PO TBPK: 200.0000 ug | ORAL_TABLET | Freq: Two times a day (BID) | ORAL | 3 refills | Status: DC

## 2024-03-06 ENCOUNTER — Ambulatory Visit (INDEPENDENT_AMBULATORY_CARE_PROVIDER_SITE_OTHER): Payer: Medicare Other | Admitting: Pulmonary Disease

## 2024-03-06 ENCOUNTER — Encounter: Payer: Self-pay | Admitting: Pulmonary Disease

## 2024-03-06 VITALS — BP 140/60 | HR 60 | Temp 97.6°F | Ht 64.0 in | Wt 181.2 lb

## 2024-03-06 DIAGNOSIS — I272 Pulmonary hypertension, unspecified: Secondary | ICD-10-CM

## 2024-03-06 DIAGNOSIS — I5031 Acute diastolic (congestive) heart failure: Secondary | ICD-10-CM

## 2024-03-06 DIAGNOSIS — R0600 Dyspnea, unspecified: Secondary | ICD-10-CM

## 2024-03-06 NOTE — Patient Instructions (Signed)
 I will see you back in about 6 months  Continue your graded activities as tolerated  Continue the sildenafil   Call us  with significant concerns  Make sure you are taking your water pills on a regular basis  You do not have to continue with albuterol 

## 2024-03-06 NOTE — Progress Notes (Signed)
 Carrie Guzman    [id number]    April 27, 1946  Primary Care Physician:Wile, Chales [place], MD  Referring Physician: Azalia Leo, MD [address] [place],  [place] [address]  Chief complaint:   Patient with history of PE, pulmonary hypertension In for follow-up today  HPI:  Much improved from her last visit.  Prior to that was treated for pulmonary embolism, pulmonary hypertension Has been following up with cardiology started on sildenafil  On diuretics. Activity level has improved Able to exercise on a regular basis doing 3 days of exercise for 45 minutes.  She recollects lately being able to walk for an hour History of atrial fibrillation for which she had cardioversion previously  Was evaluated for sleep apnea previously-negative for significant sleep disordered breathing.  Breathing and activity tolerance overall better  No chest pains or chest discomfort, no wheezing  Encouraged to continue oxygen use at night Encouraged to continue her blood thinners as well  Past history of smoking quit in 1973 Was initially a social smoker smoked about a pack a day for about 8 years  History of chronic lower extremity lymphedema, hypertension, morbid obesity  Was evaluated for a pulmonary embolism following increasing complaints of shortness of breath - diagnosed with pulmonary embolism with right heart strain  Outpatient Encounter Medications as of 03/06/2024  Medication Sig   acetaminophen  (TYLENOL ) 650 MG CR tablet Take 1,300 mg by mouth every 8 (eight) hours as needed for pain.   albuterol  (VENTOLIN  HFA) 108 (90 Base) MCG/ACT inhaler Inhale 2 puffs into the lungs every 6 (six) hours as needed for wheezing or shortness of breath.   apixaban  (ELIQUIS ) 5 MG TABS tablet Take 1 tablet (5 mg total) by mouth 2 (two) times daily. Please request future refills from Dr. Gaynell Keeler with pulmonology.   furosemide  (LASIX ) 40 MG tablet Take 2 tablets (80 mg total) by mouth daily.    Menthol, Topical Analgesic, (BIOFREEZE EX) Apply 1 Application topically as needed (pain).   Multiple Vitamins-Minerals (CENTRUM SILVER 50+WOMEN) TABS Take 1 tablet by mouth daily.   OVER THE COUNTER MEDICATION Take 1 tablet by mouth daily at 2 am. smooth move   potassium chloride  (KLOR-CON  M) 10 MEQ tablet Take 10 mEq by mouth daily.   rosuvastatin  (CRESTOR ) 10 MG tablet Take 10 mg by mouth daily.   Selexipag  (UPTRAVI  TITRATION) 200 & 800 MCG TBPK Take 200 mcg by mouth 2 (two) times daily. Uptitrate by 200 mcg BID every week to target dose of 1600 mcg BID   sildenafil  (REVATIO ) 20 MG tablet Take 2 tablets (40 mg total) by mouth 3 (three) times daily.   sotalol  (BETAPACE ) 80 MG tablet Take 1 tablet (80 mg total) by mouth 2 (two) times daily.   spironolactone  (ALDACTONE ) 25 MG tablet Take 1 tablet (25 mg total) by mouth daily.   triamcinolone (KENALOG) 0.025 % ointment Apply 1 Application topically daily as needed (irritation).   No facility-administered encounter medications on file as of 03/06/2024.    Allergies as of 03/06/2024   (No Known Allergies)    Past Medical History:  Diagnosis Date   CHF (congestive heart failure) (HCC)    Elevated brain natriuretic peptide (BNP) level    Elevated cholesterol    Heart murmur    HLD (hyperlipidemia)    Hypertension    Lower extremity cellulitis    Lymphedema of lower extremity    Psoriasis    Squamous cell carcinoma of skin 05/12/2021  Left Lower Leg - anterior (in situ) (curet and 5FU)    Past Surgical History:  Procedure Laterality Date   ABDOMINAL HERNIA REPAIR     ABDOMINAL HYSTERECTOMY     ANKLE FRACTURE SURGERY     CARDIOVERSION N/A 12/01/2023   Procedure: CARDIOVERSION;  Surgeon: Lauralee Poll, MD;  Location: [facility] INVASIVE CV LAB;  Service: Cardiovascular;  Laterality: N/A;   CATARACT EXTRACTION     CHOLECYSTECTOMY     RIGHT HEART CATH N/A 10/10/2023   Procedure: RIGHT HEART CATH;  Surgeon: Lauralee Poll, MD;   [facility];  Service: Cardiovascular;  Laterality: N/A;    Family History  Problem Relation Age of Onset   Hypertension Father    Heart attack Father    Psoriasis Father    Diabetes Maternal Grandfather     Social History   Socioeconomic History   Marital status: Married    Spouse name: Not on file   Number of children: Not on file   Years of education: Not on file   Highest education level: Not on file  Occupational History   Not on file  Tobacco Use   Smoking status: Former    Types: Cigarettes    Passive exposure: Never   Smokeless tobacco: Never  Vaping Use   Vaping status: Never Used  Substance and Sexual Activity   Alcohol use: Never   Drug use: Never   Sexual activity: Not on file  Other Topics Concern   Not on file  Social History Narrative   Not on file   Social Drivers of Health   Financial Resource Strain: Not on file  Food Insecurity: No Food Insecurity (06/21/2023)   Hunger Vital Sign    Worried About Running Out of Food in the Last Year: Never true    Ran Out of Food in the Last Year: Never true  Transportation Needs: No Transportation Needs (06/21/2023)   PRAPARE - Administrator, Civil Service (Medical): No    Lack of Transportation (Non-Medical): No  Physical Activity: Not on file  Stress: Not on file  Social Connections: Not on file  Intimate Partner Violence: Not At Risk (06/21/2023)   Humiliation, Afraid, Rape, and Kick questionnaire    Fear of Current or Ex-Partner: No    Emotionally Abused: No    Physically Abused: No    Sexually Abused: No    Review of Systems  Constitutional:  Negative for fatigue.  Respiratory:  Positive for shortness of breath.   Cardiovascular:  Positive for leg swelling.  Psychiatric/Behavioral:  Negative for sleep disturbance.     There were no vitals filed for this visit.    Physical Exam Constitutional:      Appearance: She is obese.  HENT:     Head: Normocephalic.      Mouth/Throat:     Mouth: Mucous membranes are moist.  Eyes:     General: No scleral icterus. Cardiovascular:     Rate and Rhythm: Normal rate and regular rhythm.     Heart sounds: No murmur heard.    No friction rub.  Pulmonary:     Effort: No respiratory distress.     Breath sounds: No stridor. No wheezing or rhonchi.  Musculoskeletal:        General: Swelling present.     Cervical back: No rigidity or tenderness.  Neurological:     Mental Status: She is alert.  Psychiatric:        Mood  and Affect: Mood normal.     Data Reviewed:  Pulmonary function test from 11/06/2023 does show significant bronchodilator response  Echocardiogram reviewed showing grade 1 diastolic dysfunction, severely reduced right ventricular function, severely dilated right atrial size with severe pulmonary hypertension  Recent sleep study 10/15/2023, home sleep test revealed no significant sleep disordered breathing, O2 nadir of 86%  Cardiac catheterization PA pressure 94/25 with a mean of 48, wedge pressure of 15, PVR 5.27 Wood units  Assessment:  History of pulmonary embolism - On anticoagulation  Severe pulmonary hypertension - Following up with cardiology - On sildenafil   Deconditioning has improved - Exercising regularly  Pulmonary function test with significant bronchodilator response - Rarely needs rescue inhaler  Plan/Recommendations:  Encouraged to continue activity as tolerated  Continue vasodilator therapy  Follow-up in about 6 months  Continue diuretics  Encouraged to give us  a call with significant concerns  She rarely has needed her rescue inhalers lately  Myer Artis MD [facility] 03/06/2024, 9:19 AM  CC: Azalia Leo, MD

## 2024-03-21 ENCOUNTER — Ambulatory Visit (INDEPENDENT_AMBULATORY_CARE_PROVIDER_SITE_OTHER): Admitting: Podiatry

## 2024-03-21 ENCOUNTER — Encounter: Payer: Self-pay | Admitting: Podiatry

## 2024-03-21 DIAGNOSIS — M79675 Pain in left toe(s): Secondary | ICD-10-CM

## 2024-03-21 DIAGNOSIS — D696 Thrombocytopenia, unspecified: Secondary | ICD-10-CM

## 2024-03-21 DIAGNOSIS — D689 Coagulation defect, unspecified: Secondary | ICD-10-CM

## 2024-03-21 DIAGNOSIS — B351 Tinea unguium: Secondary | ICD-10-CM

## 2024-03-21 DIAGNOSIS — M79674 Pain in right toe(s): Secondary | ICD-10-CM

## 2024-03-21 NOTE — Progress Notes (Signed)
 This patient returns to my office for at risk foot care.  This patient requires this care by a professional since this patient will be at risk due to having coagulation defect and thrombocytopenia.  This patient is unable to cut nails herself since the patient cannot reach his nails.These nails are painful walking and wearing shoes.  This patient presents for at risk foot care today.  General Appearance  Alert, conversant and in no acute stress.  Vascular  Dorsalis pedis and posterior tibial  pulses are weakly  palpable  bilaterally.  Capillary return is within normal limits  bilaterally. Temperature is within normal limits  bilaterally.  Swelling legs/feet.  Neurologic  Senn-Weinstein monofilament wire test within normal limits  bilaterally. Muscle power within normal limits bilaterally.  Nails Thick disfigured discolored nails with subungual debris  from hallux to fifth toes bilaterally. No evidence of bacterial infection or drainage bilaterally.  Orthopedic  No limitations of motion  feet .  No crepitus or effusions noted.  No bony pathology or digital deformities noted.  Skin  normotropic skin with no porokeratosis noted bilaterally.  No signs of infections or ulcers noted.     Onychomycosis  Pain in right toes  Pain in left toes  Consent was obtained for treatment procedures.   Mechanical debridement of nails 1-5  bilaterally performed with a nail nipper.  Filed with dremel without incident.    Return office visit    3 months                  Told patient to return for periodic foot care and evaluation due to potential at risk complications.   Ruffin Cotton DPM

## 2024-04-08 ENCOUNTER — Other Ambulatory Visit: Payer: Self-pay | Admitting: Cardiology

## 2024-04-08 DIAGNOSIS — I480 Paroxysmal atrial fibrillation: Secondary | ICD-10-CM

## 2024-04-08 NOTE — Telephone Encounter (Signed)
 Prescription refill request for Eliquis  received. Indication: Afib  Last office visit: 02/13/24 Alease Amend)  Scr: 0.86 (01/02/24)  Age: 78 Weight: 82.2kg  Appropriate dose. Refill sent.

## 2024-04-12 ENCOUNTER — Encounter (HOSPITAL_COMMUNITY): Payer: Self-pay | Admitting: Cardiology

## 2024-04-12 ENCOUNTER — Ambulatory Visit (HOSPITAL_COMMUNITY)
Admission: RE | Admit: 2024-04-12 | Discharge: 2024-04-12 | Disposition: A | Discharge status: Home / Self Care | Source: Ambulatory Visit | Attending: Cardiology | Admitting: Cardiology

## 2024-04-12 VITALS — BP 118/60 | HR 64 | Wt 179.2 lb

## 2024-04-12 DIAGNOSIS — I480 Paroxysmal atrial fibrillation: Secondary | ICD-10-CM

## 2024-04-12 DIAGNOSIS — Z6830 Body mass index (BMI) 30.0-30.9, adult: Secondary | ICD-10-CM | POA: Diagnosis not present

## 2024-04-12 DIAGNOSIS — I5032 Chronic diastolic (congestive) heart failure: Secondary | ICD-10-CM | POA: Diagnosis not present

## 2024-04-12 DIAGNOSIS — I50810 Right heart failure, unspecified: Secondary | ICD-10-CM

## 2024-04-12 DIAGNOSIS — I272 Pulmonary hypertension, unspecified: Secondary | ICD-10-CM | POA: Insufficient documentation

## 2024-04-12 DIAGNOSIS — E669 Obesity, unspecified: Secondary | ICD-10-CM | POA: Diagnosis not present

## 2024-04-12 DIAGNOSIS — I4891 Unspecified atrial fibrillation: Secondary | ICD-10-CM | POA: Diagnosis not present

## 2024-04-12 DIAGNOSIS — I890 Lymphedema, not elsewhere classified: Secondary | ICD-10-CM | POA: Diagnosis not present

## 2024-04-12 DIAGNOSIS — Z79899 Other long term (current) drug therapy: Secondary | ICD-10-CM | POA: Insufficient documentation

## 2024-04-12 DIAGNOSIS — Z86711 Personal history of pulmonary embolism: Secondary | ICD-10-CM | POA: Diagnosis not present

## 2024-04-12 DIAGNOSIS — Z7901 Long term (current) use of anticoagulants: Secondary | ICD-10-CM | POA: Insufficient documentation

## 2024-04-12 NOTE — Patient Instructions (Signed)
 There has been no changes to your medications.  Please follow up with our heart failure pharmacist in 3 weeks.  Your physician recommends that you schedule a follow-up appointment in: 3 months.  If you have any questions or concerns before your next appointment please send us  a message through [place] or call our office at [phone].    TO LEAVE A MESSAGE FOR THE NURSE SELECT OPTION 2, PLEASE LEAVE A MESSAGE INCLUDING: YOUR NAME DATE OF BIRTH CALL BACK NUMBER REASON FOR CALL**this is important as we prioritize the call backs  YOU WILL RECEIVE A CALL BACK THE SAME DAY AS LONG AS YOU CALL BEFORE 4:00 PM  At the [facility], you and your health needs are our priority. As part of our continuing mission to provide you with exceptional heart care, we have created designated Provider Care Teams. These Care Teams include your primary Cardiologist (physician) and Advanced Practice Providers (APPs- Physician Assistants and Nurse Practitioners) who all work together to provide you with the care you need, when you need it.   You may see any of the following providers on your designated Care Team at your next follow up: Dr Jules Oar Dr Peder Bourdon Dr. Alwin Baars Dr. Arta Lark Amy Marijane Shoulders, NP Ruddy Corral, [place] [facility] [place], [place] Dennise Fitz, NP [place] Lee, NP Shawnee Dellen, NP Luster Salters, PharmD Bevely Brush, PharmD   Please be sure to bring in all your medications bottles to every appointment.    Thank you for choosing [facility]

## 2024-04-12 NOTE — Progress Notes (Signed)
 PULMONARY HYPERTENSION FOLLOW UP CLINIC NOTE  Referring Physician: Azalia Leo, MD  Primary Care: Azalia Leo, MD Primary Cardiologist:   HPI: Carrie Guzman is a 78 y.o. female with a PMH of lymphedema, thrombocytopenia, pericardial effusion who presents for follow up of pulmonary hypertension.      Patient had no previous cardiac history but was admitted to the hospital in August with concerns of shortness of breath ongoing for 2-3 weeks. Patient was diagnosed with CHF exacerbation. Echocardiogram showed preserved EF with elevated PASP, cor pulmonale and pericardial effusion without tamponade. CTA chest showed acute pulmonary embolism and right side heart strain. She was discharged with diuretics and seen in [facility] clinic for follow up.   She underwent RHC on 10/2023 that showed RA: 10, PA: 94/25 (48), PCWP 15, Fick CO/CI 6.27/3.16, PVR 5.27.   Patient was started on sildenafil  20 mg 3 times daily.  She presented to routine lab visit with new atrial fibrillation with RVR.  Cardioversion was arranged which was successfully completed after anticoagulation on 12/01/2023.  Called back in 3/7 with recurrent tachycardia and follow up was made.   600 feet on 01/15/24.   Started on selexipag  02/2024.      SUBJECTIVE:  Recently started on uptravi , now on the 200 dose and tolerating well. Breathing better than she has in years, walking around the store faster. Her lymphedema is also significantly improved as well. Weight stable since last visit.  PMH, current medications, allergies, social history, and family history reviewed in epic.  PHYSICAL EXAM: Vitals:   04/12/24 0848  BP: 118/60  Pulse: 64  SpO2: 98%       GENERAL: Chronically ill-appearing Lungs- Diminished breath sounds, normal WOB CARDIAC:  JVP: Mildly elevated          Normal rate and rhythm ABDOMEN: Soft, non-tender, non-distended EXTREMITIES: Moderate lymphedema, continues to improve, leg wraps in  place NEUROLOGIC: Patient is oriented x3 with no focal or lateralizing neurologic deficits.    DATA REVIEW  ECG: Sinus rhythm, right bundle branch block, frequent PACs.    ECHO: Severely dilated RV, elevated RVSP, near complete obliteration of LV, underfilled.  CATH: 10/2023: RHC with RA: 10, PA 94/25 (48), PCWP 15, Fick CO/CI 6.27/3.16, PVR 5.27.  Etiology of PH: Primarily group I WHO Functional class: II : 182.99m Volume status:  Euvolemic  Pulmonary vasodilators: Sildenafil , selexipag   Work up: [employer] Orders: Lab workup completed previously and normal Imaging: V/Q scan negative, sleep study negative Functional: PFTs consistent with restrictive process Hemodynamics: RHC as above   ASSESSMENT & PLAN:  Pulmonary hypertension: RHC as above, patient with predominant precapillary PH with elevated PVR. Has done well with sildenafil , starting selexipag  given persistent FC III symptoms. Tolerating well - Continue sildenafil  40mg  TID - Continue lasix  80mg  daliy, can consider decreasing after selexipag  titrated -  Avoiding ERA given extensive lymphedema, borderline PCWP pressure, and recent overload with atrial fibrillation. On selexipag  200mcg BID - Continue sotalol  80mg  BID - Continue apixaban  5mg  BID, suspect will need lifelong anticoagulation - Continue spironolactone  25mg  daily  Atrial fibrillation: Cardioversion 11/2023. EKG today with normal sinus rhythm.  Stable on current medical therapy.  -Continue sotalol  80mg  BID -EKG and repeat labs at next visit - Continue apixaban  5mg  BID  Obesity: Body mass index is 30.76 kg/m. - Improving  Hypercalcemia: Recent bone scan negative, follows with hematology.  Pulmonary embolism: Prior, V/Q scan negative. - Follows with hematology who reports need for lifelong OAC (will need  given Afib)    Arta Lark, MD Advanced Heart Failure Mechanical Circulatory Support 04/14/24

## 2024-04-15 ENCOUNTER — Telehealth (HOSPITAL_COMMUNITY): Payer: Self-pay | Admitting: Cardiology

## 2024-04-15 NOTE — Telephone Encounter (Signed)
 [facility]ity pharmacy called to report pt stopped Uptravi  due to diarrhea. Last dose 04/13/24  Pt was educated on benefits of meds and given advise on how to mange symptoms however pt declined all and stopped meds   Treatment lasted 2-4 weeks   Message to provider as Carrie Guzman

## 2024-04-15 NOTE — Telephone Encounter (Signed)
 Sorry to hear this, we can talk next week Lauren about any potential alternatives.  Thanks, [employer]

## 2024-05-07 ENCOUNTER — Other Ambulatory Visit (HOSPITAL_COMMUNITY): Payer: Self-pay | Admitting: Cardiology

## 2024-05-07 DIAGNOSIS — I480 Paroxysmal atrial fibrillation: Secondary | ICD-10-CM

## 2024-05-07 MED ORDER — APIXABAN 5 MG PO TABS: 5.0000 mg | ORAL_TABLET | Freq: Two times a day (BID) | ORAL | 5 refills | Status: DC

## 2024-05-09 ENCOUNTER — Other Ambulatory Visit (HOSPITAL_COMMUNITY)

## 2024-06-03 ENCOUNTER — Encounter: Payer: Self-pay | Admitting: Podiatry

## 2024-06-03 ENCOUNTER — Ambulatory Visit (INDEPENDENT_AMBULATORY_CARE_PROVIDER_SITE_OTHER): Admitting: Podiatry

## 2024-06-03 DIAGNOSIS — M79674 Pain in right toe(s): Secondary | ICD-10-CM | POA: Diagnosis not present

## 2024-06-03 DIAGNOSIS — B351 Tinea unguium: Secondary | ICD-10-CM | POA: Diagnosis not present

## 2024-06-03 DIAGNOSIS — M79675 Pain in left toe(s): Secondary | ICD-10-CM

## 2024-06-03 DIAGNOSIS — D696 Thrombocytopenia, unspecified: Secondary | ICD-10-CM

## 2024-06-03 NOTE — Progress Notes (Signed)
 This patient returns to my office for at risk foot care.  This patient requires this care by a professional since this patient will be at risk due to having coagulation defect and thrombocytopenia.  This patient is unable to cut nails herself since the patient cannot reach his nails.These nails are painful walking and wearing shoes.  This patient presents for at risk foot care today.  General Appearance  Alert, conversant and in no acute stress.  Vascular  Dorsalis pedis and posterior tibial  pulses are weakly  palpable  bilaterally.  Capillary return is within normal limits  bilaterally. Temperature is within normal limits  bilaterally.  Swelling legs/feet.  Neurologic  Senn-Weinstein monofilament wire test within normal limits  bilaterally. Muscle power within normal limits bilaterally.  Nails Thick disfigured discolored nails with subungual debris  from hallux to fifth toes bilaterally. No evidence of bacterial infection or drainage bilaterally.  Orthopedic  No limitations of motion  feet .  No crepitus or effusions noted.  No bony pathology or digital deformities noted.  Skin  normotropic skin with no porokeratosis noted bilaterally.  No signs of infections or ulcers noted.   Callus sub 5th met right foot.  Onychomycosis  Pain in right toes  Pain in left toes   Callus right foot   Consent was obtained for treatment procedures.   Mechanical debridement of nails 1-5  bilaterally performed with a nail nipper.  Filed with dremel without incident. Debride with # 15 blade and dremel tool.   Return office visit    10 weeks                  Told patient to return for periodic foot care and evaluation due to potential at risk complications.   Cordella Bold DPM

## 2024-06-12 ENCOUNTER — Other Ambulatory Visit (HOSPITAL_COMMUNITY): Payer: Self-pay | Admitting: Cardiology

## 2024-07-09 ENCOUNTER — Other Ambulatory Visit (HOSPITAL_COMMUNITY): Payer: Self-pay | Admitting: Cardiology

## 2024-07-09 MED ORDER — SPIRONOLACTONE 25 MG PO TABS: 25.0000 mg | ORAL_TABLET | Freq: Every day | ORAL | 5 refills | Status: DC

## 2024-07-11 ENCOUNTER — Other Ambulatory Visit: Payer: Self-pay | Admitting: Hematology

## 2024-07-11 DIAGNOSIS — I2609 Other pulmonary embolism with acute cor pulmonale: Secondary | ICD-10-CM

## 2024-07-11 DIAGNOSIS — D696 Thrombocytopenia, unspecified: Secondary | ICD-10-CM

## 2024-07-12 ENCOUNTER — Inpatient Hospital Stay: Attending: Hematology

## 2024-07-12 DIAGNOSIS — Z86711 Personal history of pulmonary embolism: Secondary | ICD-10-CM | POA: Insufficient documentation

## 2024-07-12 DIAGNOSIS — I2609 Other pulmonary embolism with acute cor pulmonale: Secondary | ICD-10-CM

## 2024-07-12 DIAGNOSIS — Z87891 Personal history of nicotine dependence: Secondary | ICD-10-CM | POA: Diagnosis not present

## 2024-07-12 DIAGNOSIS — Z7901 Long term (current) use of anticoagulants: Secondary | ICD-10-CM | POA: Insufficient documentation

## 2024-07-12 DIAGNOSIS — D696 Thrombocytopenia, unspecified: Secondary | ICD-10-CM | POA: Insufficient documentation

## 2024-07-12 LAB — CBC WITH DIFFERENTIAL (CANCER CENTER ONLY)
Abs Immature Granulocytes: 0.01 K/uL (ref 0.00–0.07)
Basophils Absolute: 0 K/uL (ref 0.0–0.1)
Basophils Relative: 0 %
Eosinophils Absolute: 0.2 K/uL (ref 0.0–0.5)
Eosinophils Relative: 3 %
HCT: 39.1 % (ref 36.0–46.0)
Hemoglobin: 13.2 g/dL (ref 12.0–15.0)
Immature Granulocytes: 0 %
Lymphocytes Relative: 26 %
Lymphs Abs: 1.6 K/uL (ref 0.7–4.0)
MCH: 32.4 pg (ref 26.0–34.0)
MCHC: 33.8 g/dL (ref 30.0–36.0)
MCV: 95.8 fL (ref 80.0–100.0)
Monocytes Absolute: 0.6 K/uL (ref 0.1–1.0)
Monocytes Relative: 11 %
Neutro Abs: 3.6 K/uL (ref 1.7–7.7)
Neutrophils Relative %: 60 %
Platelet Count: 144 K/uL — ABNORMAL LOW (ref 150–400)
RBC: 4.08 MIL/uL (ref 3.87–5.11)
RDW: 13.3 % (ref 11.5–15.5)
WBC Count: 6.1 K/uL (ref 4.0–10.5)
nRBC: 0 % (ref 0.0–0.2)

## 2024-07-12 LAB — CMP (CANCER CENTER ONLY)
ALT: 12 U/L (ref 0–44)
AST: 21 U/L (ref 15–41)
Albumin: 3.7 g/dL (ref 3.5–5.0)
Alkaline Phosphatase: 67 U/L (ref 38–126)
Anion gap: 3 — ABNORMAL LOW (ref 5–15)
BUN: 20 mg/dL (ref 8–23)
CO2: 32 mmol/L (ref 22–32)
Calcium: 10.9 mg/dL — ABNORMAL HIGH (ref 8.9–10.3)
Chloride: 104 mmol/L (ref 98–111)
Creatinine: 0.66 mg/dL (ref 0.44–1.00)
GFR, Estimated: >60 mL/min
Glucose, Bld: 77 mg/dL (ref 70–99)
Potassium: 4.1 mmol/L (ref 3.5–5.1)
Sodium: 139 mmol/L (ref 135–145)
Total Bilirubin: 1.3 mg/dL — ABNORMAL HIGH (ref 0.0–1.2)
Total Protein: 7.1 g/dL (ref 6.5–8.1)

## 2024-07-15 LAB — KAPPA/LAMBDA LIGHT CHAINS
Kappa free light chain: 50.5 mg/L — ABNORMAL HIGH (ref 3.3–19.4)
Kappa, lambda light chain ratio: 1.26 (ref 0.26–1.65)
Lambda free light chains: 40.2 mg/L — ABNORMAL HIGH (ref 5.7–26.3)

## 2024-07-16 LAB — MULTIPLE MYELOMA PANEL, SERUM
Albumin SerPl Elph-Mcnc: 3.1 g/dL (ref 2.9–4.4)
Albumin/Glob SerPl: 1 (ref 0.7–1.7)
Alpha 1: 0.3 g/dL (ref 0.0–0.4)
Alpha2 Glob SerPl Elph-Mcnc: 0.6 g/dL (ref 0.4–1.0)
B-Globulin SerPl Elph-Mcnc: 1 g/dL (ref 0.7–1.3)
Gamma Glob SerPl Elph-Mcnc: 1.5 g/dL (ref 0.4–1.8)
Globulin, Total: 3.4 g/dL (ref 2.2–3.9)
IgA: 576 mg/dL — ABNORMAL HIGH (ref 64–422)
IgG (Immunoglobin G), Serum: 1291 mg/dL (ref 586–1602)
IgM (Immunoglobulin M), Srm: 175 mg/dL (ref 26–217)
M Protein SerPl Elph-Mcnc: 0.4 g/dL — ABNORMAL HIGH
Total Protein ELP: 6.5 g/dL (ref 6.0–8.5)

## 2024-07-17 ENCOUNTER — Other Ambulatory Visit (HOSPITAL_COMMUNITY): Payer: Self-pay | Admitting: Cardiology

## 2024-07-26 ENCOUNTER — Telehealth: Admitting: Hematology

## 2024-07-29 ENCOUNTER — Telehealth (HOSPITAL_COMMUNITY): Payer: Self-pay | Admitting: Cardiology

## 2024-07-29 NOTE — Telephone Encounter (Signed)
 Called to confirm/remind patient of their appointment at the [facility] on 07/29/2024.   Appointment:   [] Confirmed  [x] Left mess   [] No answer/No voice mail  [] VM Full/unable to leave message  [] Phone not in service  Patient reminded to bring all medications and/or complete list.  Confirmed patient has transportation. Gave directions, instructed to utilize valet parking.

## 2024-07-30 ENCOUNTER — Ambulatory Visit (HOSPITAL_COMMUNITY)
Admission: RE | Admit: 2024-07-30 | Discharge: 2024-07-30 | Disposition: A | Discharge status: Home / Self Care | Source: Ambulatory Visit | Attending: Cardiology | Admitting: Cardiology

## 2024-07-30 ENCOUNTER — Ambulatory Visit (HOSPITAL_COMMUNITY): Payer: Self-pay | Admitting: Cardiology

## 2024-07-30 ENCOUNTER — Encounter (HOSPITAL_COMMUNITY): Payer: Self-pay | Admitting: Cardiology

## 2024-07-30 VITALS — BP 138/82 | HR 50 | Ht 64.0 in | Wt 184.6 lb

## 2024-07-30 DIAGNOSIS — I890 Lymphedema, not elsewhere classified: Secondary | ICD-10-CM | POA: Insufficient documentation

## 2024-07-30 DIAGNOSIS — E669 Obesity, unspecified: Secondary | ICD-10-CM | POA: Diagnosis not present

## 2024-07-30 DIAGNOSIS — I480 Paroxysmal atrial fibrillation: Secondary | ICD-10-CM

## 2024-07-30 DIAGNOSIS — I272 Pulmonary hypertension, unspecified: Secondary | ICD-10-CM | POA: Diagnosis not present

## 2024-07-30 DIAGNOSIS — I50810 Right heart failure, unspecified: Secondary | ICD-10-CM

## 2024-07-30 DIAGNOSIS — Z6831 Body mass index (BMI) 31.0-31.9, adult: Secondary | ICD-10-CM | POA: Insufficient documentation

## 2024-07-30 DIAGNOSIS — I4891 Unspecified atrial fibrillation: Secondary | ICD-10-CM | POA: Insufficient documentation

## 2024-07-30 DIAGNOSIS — Z86711 Personal history of pulmonary embolism: Secondary | ICD-10-CM | POA: Diagnosis not present

## 2024-07-30 DIAGNOSIS — I5032 Chronic diastolic (congestive) heart failure: Secondary | ICD-10-CM | POA: Insufficient documentation

## 2024-07-30 DIAGNOSIS — I3139 Other pericardial effusion (noninflammatory): Secondary | ICD-10-CM | POA: Insufficient documentation

## 2024-07-30 DIAGNOSIS — Z79899 Other long term (current) drug therapy: Secondary | ICD-10-CM | POA: Diagnosis not present

## 2024-07-30 DIAGNOSIS — D696 Thrombocytopenia, unspecified: Secondary | ICD-10-CM | POA: Diagnosis not present

## 2024-07-30 LAB — TSH: TSH: 1.51 u[IU]/mL (ref 0.350–4.500)

## 2024-07-30 LAB — T4, FREE: Free T4: 1.05 ng/dL (ref 0.61–1.12)

## 2024-07-30 LAB — BRAIN NATRIURETIC PEPTIDE: B Natriuretic Peptide: 186.6 pg/mL — ABNORMAL HIGH (ref 0.0–100.0)

## 2024-07-30 MED ORDER — POTASSIUM CHLORIDE CRYS ER 10 MEQ PO TBCR: 10.0000 meq | EXTENDED_RELEASE_TABLET | Freq: Every day | ORAL | 3 refills | Status: AC

## 2024-07-30 MED ORDER — ROSUVASTATIN CALCIUM 10 MG PO TABS: 10.0000 mg | ORAL_TABLET | Freq: Every day | ORAL | 3 refills | Status: AC

## 2024-07-30 MED ORDER — SILDENAFIL CITRATE 20 MG PO TABS: 40.0000 mg | ORAL_TABLET | Freq: Three times a day (TID) | ORAL | 8 refills | Status: DC

## 2024-07-30 MED ORDER — APIXABAN 5 MG PO TABS: 5.0000 mg | ORAL_TABLET | Freq: Two times a day (BID) | ORAL | 11 refills | Status: DC

## 2024-07-30 MED ORDER — SPIRONOLACTONE 25 MG PO TABS: 25.0000 mg | ORAL_TABLET | Freq: Every day | ORAL | 3 refills | Status: AC

## 2024-07-30 MED ORDER — FUROSEMIDE 40 MG PO TABS: 80.0000 mg | ORAL_TABLET | Freq: Every day | ORAL | 3 refills | Status: AC

## 2024-07-30 NOTE — Patient Instructions (Signed)
 There has been no changes to your medications.  Labs done today, your results will be available in MyChart, we will contact you for abnormal readings.  Your physician has requested that you have an echocardiogram. Echocardiography is a painless test that uses sound waves to create images of your heart. It provides your doctor with information about the size and shape of your heart and how well your heart's chambers and valves are working. This procedure takes approximately one hour. There are no restrictions for this procedure. Please do NOT wear cologne, perfume, aftershave, or lotions (deodorant is allowed). Please arrive 15 minutes prior to your appointment time.  Please note: We ask at that you not bring children with you during ultrasound (echo/ vascular) testing. Due to room size and safety concerns, children are not allowed in the ultrasound rooms during exams. Our front office staff cannot provide observation of children in our lobby area while testing is being conducted. An adult accompanying a patient to their appointment will only be allowed in the ultrasound room at the discretion of the ultrasound technician under special circumstances. We apologize for any inconvenience.  Your physician recommends that you schedule a follow-up appointment in: 3 months.  If you have any questions or concerns before your next appointment please send [place] a message through [place] or call our office at [phone].    TO LEAVE A MESSAGE FOR THE NURSE SELECT OPTION 2, PLEASE LEAVE A MESSAGE INCLUDING: YOUR NAME DATE OF BIRTH CALL BACK NUMBER REASON FOR CALL**this is important as we prioritize the call backs  YOU WILL RECEIVE A CALL BACK THE SAME DAY AS LONG AS YOU CALL BEFORE 4:00 PM  At the [facility], you and your health needs are our priority. As part of our continuing mission to provide you with exceptional heart care, we have created designated Provider Care Teams. These Care  Teams include your primary Cardiologist (physician) and Advanced Practice Providers (APPs- Physician Assistants and Nurse Practitioners) who all work together to provide you with the care you need, when you need it.   You may see any of the following providers on your designated Care Team at your next follow up: Dr Arvilla Meres Dr Marca Ancona Dr. Dorthula Nettles Dr. [name] Amy Filbert Schilder, NP Robbie Lis, [place] [facility] [place], [place] Brynda Peon, NP [place] Lee, NP Clarisa Kindred, NP Karle Plumber, PharmD Enos Fling, PharmD   Please be sure to bring in all your medications bottles to every appointment.    Thank you for choosing [facility]

## 2024-07-31 ENCOUNTER — Inpatient Hospital Stay: Admitting: Hematology

## 2024-07-31 DIAGNOSIS — D472 Monoclonal gammopathy: Secondary | ICD-10-CM

## 2024-07-31 DIAGNOSIS — I8290 Acute embolism and thrombosis of unspecified vein: Secondary | ICD-10-CM

## 2024-07-31 DIAGNOSIS — D696 Thrombocytopenia, unspecified: Secondary | ICD-10-CM | POA: Diagnosis not present

## 2024-07-31 LAB — T3, FREE: T3, Free: 3.3 pg/mL (ref 2.0–4.4)

## 2024-08-03 NOTE — Progress Notes (Signed)
 PULMONARY HYPERTENSION FOLLOW UP CLINIC NOTE  Referring Physician: Stephane Leita DEL, MD  Primary Care: Stephane Leita DEL, MD Primary Cardiologist:   HPI: Carrie Guzman is a 78 y.o. female with a PMH of lymphedema, thrombocytopenia, pericardial effusion who presents for follow up of pulmonary hypertension.      Patient had no previous cardiac history but was admitted to the hospital in August with concerns of shortness of breath ongoing for 2-3 weeks. Patient was diagnosed with CHF exacerbation. Echocardiogram showed preserved EF with elevated PASP, cor pulmonale and pericardial effusion without tamponade. CTA chest showed acute pulmonary embolism and right side heart strain. She was discharged with diuretics and seen in [facility] clinic for follow up.   She underwent RHC on 10/2023 that showed RA: 10, PA: 94/25 (48), PCWP 15, Fick CO/CI 6.27/3.16, PVR 5.27.   Patient was started on sildenafil  20 mg 3 times daily.  She presented to routine lab visit with new atrial fibrillation with RVR.  Cardioversion was arranged which was successfully completed after anticoagulation on 12/01/2023.  Called back in 3/7 with recurrent tachycardia and follow up was made.   600 feet on 01/15/24.   Started on selexipag  02/2024. Did not tolerate due to diarrhea.      SUBJECTIVE:  Patient overall ports that she is doing well.  She had significant diarrhea with the Uptravi  and had to stop it.  She has been taking the remainder of her medication without issues.  Her weight has overall been stable.  She denies any worsening shortness of breath, lower extremity swelling.  She is still able to get out and do everything she wants to outside of the house.  PMH, current medications, allergies, social history, and family history reviewed in epic.  PHYSICAL EXAM: Vitals:   07/30/24 0832  BP: 138/82  Pulse: (!) 50  SpO2: 95%       GENERAL: NAD, fair appearing PULM:  Normal work of breathing, CTAB CARDIAC:   JVP: Flat         Normal rate with regular rhythm. No murmurs, rubs or gallops.  Moderate lower extremity lymphedema. Warm and well perfused extremities. ABDOMEN: Soft, non-tender, non-distended. NEUROLOGIC: Patient is oriented x3 with no focal or lateralizing neurologic deficits.     DATA REVIEW  ECG: Sinus rhythm, right bundle branch block, frequent PACs.    ECHO: Severely dilated RV, elevated RVSP, near complete obliteration of LV, underfilled.  CATH: 10/2023: RHC with RA: 10, PA 94/25 (48), PCWP 15, Fick CO/CI 6.27/3.16, PVR 5.27.  Etiology of PH: Primarily group I WHO Functional class: II : 182.49m Volume status:  Euvolemic  Pulmonary vasodilators: Sildenafil   Work up: [employer] & Orders: Lab workup completed previously and normal Imaging: V/Q scan negative, sleep study negative Functional: PFTs consistent with restrictive process Hemodynamics: RHC as above   ASSESSMENT & PLAN:  Pulmonary hypertension: RHC as above, patient with predominant precapillary PH with elevated PVR. Has done well with sildenafil , improved symptoms. Unfortunately did not tolerate selexipag . Additionally is not interested in further medication titration - Repeat echocardiogram, will assess RV at that time - Continue sildenafil  40mg  TID - Continue lasix  80mg  daily -  Avoiding ERA given extensive lymphedema, borderline PCWP pressure, and prior overload with atrial fibrillation - Did not tolerate selexipag  - Continue sotalol  80mg  BID - Continue apixaban  5mg  BID - Continue spironolactone  25mg  daily  Atrial fibrillation: Cardioversion 11/2023. Remains in NSR today.  Stable on current medical therapy.  -Continue sotalol  80mg  BID - Continue  apixaban  5mg  BID  Obesity: Body mass index is 31.69 kg/m. - Improving  Hypercalcemia: Recent bone scan negative, follows with hematology.  Pulmonary embolism: Prior, V/Q scan negative. - Follows with hematology who reports need for lifelong OAC (will need  given Afib)  REVEAL Lite 2.0 score 6  Morene Brownie, MD Advanced Heart Failure Mechanical Circulatory Support 08/03/24

## 2024-08-07 NOTE — Progress Notes (Signed)
 HEMATOLOGY/ONCOLOGY CLINIC VISIT NOTE  Date of Service: .07/31/2024  Patient Care Team: Stephane Leita DEL, MD as PCP - General (Internal Medicine) Shlomo Wilbert SAUNDERS, MD as PCP - Cardiology (Cardiology) Livingston Rigg, MD as Consulting Physician (Dermatology) Onesimo Emaline Brink, MD as Consulting Physician (Hematology)  CHIEF COMPLAINTS/PURPOSE OF CONSULTATION:  Thrombocytopenia and Pulmonary embolism   HISTORY OF PRESENTING ILLNESS:    Carrie Guzman is a wonderful 78 y.o. female who has been referred to us  by attending Dr. Burgess Dare, MD, for evaluation and management of Thrombocytopenia. Patient's lab shows platelets have dropped from 111 K, on 06/21/2023, to 65 K today, 06/23/2023.    Patient has a history of chronic lower extremity lymphedema/venous insufficiency, hypertension, morbid obesity .Body mass index is 46.31 kg/m.  Who was seen as outpatient for 2 weeks ago for upper respiratory infection and had testing which showed she was COVID-negative.  Patient notes she was treated with steroids. She presented again to [facility] with increasing shortness of breath and was noted to have heart rate in the 50s and oxygen saturation of 86% with some decreased mentation per her husband. Patient was initially thought to have heart failure and was started on diuretics and had an echo which showed severe pulmonary hypertension and right-sided cor pulmonale. Subsequent CTA of the chest showed significant right upper lobe segmental and subsegmental pulmonary emboli with evidence of right heart strain.  Cardiomegaly with moderate to large pericardial effusion.   Patient is thought to have obstructive sleep apnea/obesity related hypoventilation at baseline that is undiagnosed might be causing some element of pulmonary hypertension.    She was started on IV heparin .  She was noted to have elevated bilirubin level on admission likely due to hepatic congestion this is  improving. She was also noted to have slight thrombocytopenia of 111k on admission.  Platelet counts today dropped to 65k will consulted to evaluate her thrombocytopenia. No other recent hospitalization blood counts were available for comparison to determine if her thrombocytopenia is chronic. Patient denies any bleeding issues.  No nosebleeds no gum bleeds hematuria or hematochezia. Denies any previous known history of thrombocytopenia. Has been taking Tylenol  arthritis for joint issues.  Denies significant other NSAID use. Husband was at bedside during this hospital  interview.  INTERVAL HISTORY:  Carrie Guzman is a wonderful 78 y.o. female who is here for continued evaluation and management of Thrombocytopenia and Pulmonary embolism.   .I connected with Rollene Macario Hawks on .07/31/2024 at  8:40 AM EDT by telephone visit and verified that I am speaking with the correct person using two identifiers.   I discussed the limitations, risks, security and privacy concerns of performing an evaluation and management service by telemedicine and the availability of in-person appointments. I also discussed with the patient that there may be a patient responsible charge related to this service. The patient expressed understanding and agreed to proceed.   Other persons participating in the visit and their role in the encounter: None   Patient's location: Home  Provider's location: [facility]   Chief Complaint: Thrombocytopenia and Pulmonary embolism.    Patient notes no acute new symptoms over the last 6 months.  No abnormal bleeding or bruising.  No new chest pain or shortness of breath.  She continues to be on Eliquis  without any bleeding issues. All the labs done on 07/12/2024 were discussed with her in detail.  MEDICAL HISTORY:  Past Medical History:  Diagnosis Date   CHF (congestive heart  failure) (HCC)    Elevated brain natriuretic peptide (BNP) level    Elevated cholesterol    Heart murmur     HLD (hyperlipidemia)    Hypertension    Lower extremity cellulitis    Lymphedema of lower extremity    Psoriasis    Squamous cell carcinoma of skin 05/12/2021   Left Lower Leg - anterior (in situ) (curet and 5FU)    SURGICAL HISTORY: Past Surgical History:  Procedure Laterality Date   ABDOMINAL HERNIA REPAIR     ABDOMINAL HYSTERECTOMY     ANKLE FRACTURE SURGERY     CARDIOVERSION N/A 12/01/2023   Procedure: CARDIOVERSION;  Surgeon: Zenaida Morene PARAS, MD;  Location: [facility] INVASIVE CV LAB;  Service: Cardiovascular;  Laterality: N/A;   CATARACT EXTRACTION     CHOLECYSTECTOMY     RIGHT HEART CATH N/A 10/10/2023   Procedure: RIGHT HEART CATH;  Surgeon: Zenaida Morene PARAS, MD;  Location: [facility] INVASIVE CV LAB;  Service: Cardiovascular;  Laterality: N/A;    SOCIAL HISTORY: Social History   Socioeconomic History   Marital status: Married    Spouse name: Not on file   Number of children: Not on file   Years of education: Not on file   Highest education level: Not on file  Occupational History   Not on file  Tobacco Use   Smoking status: Former    Types: Cigarettes    Passive exposure: Never   Smokeless tobacco: Never  Vaping Use   Vaping status: Never Used  Substance and Sexual Activity   Alcohol use: Never   Drug use: Never   Sexual activity: Not on file  Other Topics Concern   Not on file  Social History Narrative   Not on file   Social Drivers of Health   Financial Resource Strain: Not on file  Food Insecurity: No Food Insecurity (06/21/2023)   Hunger Vital Sign    Worried About Running Out of Food in the Last Year: Never true    Ran Out of Food in the Last Year: Never true  Transportation Needs: No Transportation Needs (06/21/2023)   PRAPARE - Administrator, Civil Service (Medical): No    Lack of Transportation (Non-Medical): No  Physical Activity: Not on file  Stress: Not on file  Social Connections: Not on file  Intimate Partner Violence: Not At Risk  (06/21/2023)   Humiliation, Afraid, Rape, and Kick questionnaire    Fear of Current or Ex-Partner: No    Emotionally Abused: No    Physically Abused: No    Sexually Abused: No    FAMILY HISTORY: Family History  Problem Relation Age of Onset   Hypertension Father    Heart attack Father    Psoriasis Father    Diabetes Maternal Grandfather     ALLERGIES:  has no known allergies.  MEDICATIONS:  Current Outpatient Medications  Medication Sig Dispense Refill   acetaminophen  (TYLENOL ) 650 MG CR tablet Take 1,300 mg by mouth every 8 (eight) hours as needed for pain.     albuterol  (VENTOLIN  HFA) 108 (90 Base) MCG/ACT inhaler Inhale 2 puffs into the lungs every 6 (six) hours as needed for wheezing or shortness of breath. 8 g 6   apixaban  (ELIQUIS ) 5 MG TABS tablet Take 1 tablet (5 mg total) by mouth 2 (two) times daily. 60 tablet 11   Cholecalciferol (VITAMIN D3) 125 MCG (5000 UT) CAPS as directed Orally     furosemide  (LASIX ) 40 MG tablet Take 2 tablets (80  mg total) by mouth daily. 90 tablet 3   Menthol, Topical Analgesic, (BIOFREEZE EX) Apply 1 Application topically as needed (pain).     Multiple Vitamins-Minerals (CENTRUM SILVER 50+WOMEN) TABS Take 1 tablet by mouth daily.     OVER THE COUNTER MEDICATION Take 1 tablet by mouth daily at 2 am. smooth move     potassium chloride  (KLOR-CON  M) 10 MEQ tablet Take 1 tablet (10 mEq total) by mouth daily. 90 tablet 3   rosuvastatin  (CRESTOR ) 10 MG tablet Take 1 tablet (10 mg total) by mouth daily. 90 tablet 3   sildenafil  (REVATIO ) 20 MG tablet Take 2 tablets (40 mg total) by mouth 3 (three) times daily. 180 tablet 8   sotalol  (BETAPACE ) 80 MG tablet Take 1 tablet (80 mg total) by mouth 2 (two) times daily. 180 tablet 3   spironolactone  (ALDACTONE ) 25 MG tablet Take 1 tablet (25 mg total) by mouth daily. 90 tablet 3   triamcinolone (KENALOG) 0.025 % ointment Apply 1 Application topically daily as needed (irritation).     No current  facility-administered medications for this visit.    REVIEW OF SYSTEMS:    10 Point review of Systems was done is negative except as noted above.  PHYSICAL EXAMINATION:  Telemedicine visit  LABORATORY DATA:  I have reviewed the data as listed  .    Latest Ref Rng & Units 07/12/2024    8:57 AM 12/13/2023    8:25 AM 11/23/2023   12:00 PM  CBC  WBC 4.0 - 10.5 K/uL 6.1  4.5  5.2   Hemoglobin 12.0 - 15.0 g/dL 86.7  87.2  87.0   Hematocrit 36.0 - 46.0 % 39.1  39.6  39.0   Platelets 150 - 400 K/uL 144  133  139     .    Latest Ref Rng & Units 07/12/2024    8:57 AM 01/02/2024    8:35 AM 12/13/2023    9:29 AM  CMP  Glucose 70 - 99 mg/dL 77  867    BUN 8 - 23 mg/dL 20  14    Creatinine 9.55 - 1.00 mg/dL 9.33  9.13    Sodium 864 - 145 mmol/L 139  139    Potassium 3.5 - 5.1 mmol/L 4.1  3.7    Chloride 98 - 111 mmol/L 104  103    CO2 22 - 32 mmol/L 32  29    Calcium  8.9 - 10.3 mg/dL 89.0  89.1  89.2   Total Protein 6.5 - 8.1 g/dL 7.1     Total Bilirubin 0.0 - 1.2 mg/dL 1.3     Alkaline Phos 38 - 126 U/L 67     AST 15 - 41 U/L 21     ALT 0 - 44 U/L 12        RADIOGRAPHIC STUDIES: I have personally reviewed the radiological images as listed and agreed with the findings in the report. No results found.   ASSESSMENT & PLAN:   78 year old female with a history of morbid obesity, possible CHF, hypertension, dyslipidemia with   #1 significant right upper lobe segmental and subsegmental pulmonary embolism with right heart strain.   #2 severe pulmonary hypertension thought to be related to PE but also due to likely undiagnosed OSA/OHS diastolic CHF.   #3 moderate to large pericardial effusion-being evaluated by cardiology.   #4 Thrombocytopenia in the context of large PE, recent URI recent use of heparin . 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 Zenaida for cardiology management. Patient continues remain on chronic anticoagulation with Eliquis  without any new issues with VTE or bleeding. Following with cardiology for severe pulmonary hypertension for which she is on sildenafil . Labs done on 07/12/2024 were reviewed with the patient in detail CBC shows normal hemoglobin of 13.2 with normal WBC count of 6.1k and near normal platelets of 144k CMP stable other than calcium  level of 10.9 Myeloma panel shows stable M spike of 0.4 g/dL of IgG IgA lambda monoclonal protein which is unchanged over the last 6 months Serum kappa lambda free light chain ratio is within normal limits - Bone survey r done on 01/16/2024 showed no notable bone lesions. -continue blood thinners for blood clot prevention as well as due to Atrial fibrillation, - Follow-up with PCP for further evaluation and management of parathyroid hypercalcemia.  Could be from her diuretics.  FOLLOW-UP: Phone visit with Dr Onesimo in 6 months  Labs 2 weeks prior to phone visit  The total time spent in the appointment was 20 minutes*.  All of the patient's questions were answered with apparent satisfaction. The patient knows to call the clinic with any problems, questions or concerns.   Emaline Onesimo MD MS AAHIVMS [facility] Hematology/Oncology Physician [facility]  .*Total Encounter Time as defined by the Centers for Medicare and Medicaid Services includes, in addition to the face-to-face time of a patient visit (documented in the note above) non-face-to-face time: obtaining and reviewing outside history, ordering and reviewing medications, tests or procedures, care coordination (communications with other health care professionals or caregivers) and documentation in the medical record.

## 2024-08-15 ENCOUNTER — Encounter: Payer: Self-pay | Admitting: Podiatry

## 2024-08-15 ENCOUNTER — Ambulatory Visit (INDEPENDENT_AMBULATORY_CARE_PROVIDER_SITE_OTHER): Admitting: Podiatry

## 2024-08-15 DIAGNOSIS — M79675 Pain in left toe(s): Secondary | ICD-10-CM | POA: Diagnosis not present

## 2024-08-15 DIAGNOSIS — B351 Tinea unguium: Secondary | ICD-10-CM | POA: Diagnosis not present

## 2024-08-15 DIAGNOSIS — D696 Thrombocytopenia, unspecified: Secondary | ICD-10-CM

## 2024-08-15 DIAGNOSIS — M79674 Pain in right toe(s): Secondary | ICD-10-CM

## 2024-08-15 NOTE — Progress Notes (Signed)
 This patient returns to my office for at risk foot care.  This patient requires this care by a professional since this patient will be at risk due to having coagulation defect and thrombocytopenia.  This patient is unable to cut nails herself since the patient cannot reach his nails.These nails are painful walking and wearing shoes.  This patient presents for at risk foot care today.  General Appearance  Alert, conversant and in no acute stress.  Vascular  Dorsalis pedis and posterior tibial  pulses are weakly  palpable  bilaterally.  Capillary return is within normal limits  bilaterally. Temperature is within normal limits  bilaterally.  Swelling legs/feet.  Neurologic  Senn-Weinstein monofilament wire test within normal limits  bilaterally. Muscle power within normal limits bilaterally.  Nails Thick disfigured discolored nails with subungual debris  from hallux to fifth toes bilaterally. No evidence of bacterial infection or drainage bilaterally.  Orthopedic  No limitations of motion  feet .  No crepitus or effusions noted.  No bony pathology or digital deformities noted.  Skin  normotropic skin with no porokeratosis noted bilaterally.  No signs of infections or ulcers noted.   Callus sub 5th met right foot.  Onychomycosis  Pain in right toes  Pain in left toes   Callus right foot   Consent was obtained for treatment procedures.   Mechanical debridement of nails 1-5  bilaterally performed with a nail nipper.  Filed with dremel without incident. Debride with # 15 blade and dremel tool.   Return office visit    10 weeks                  Told patient to return for periodic foot care and evaluation due to potential at risk complications.   Cordella Bold DPM

## 2024-09-19 ENCOUNTER — Ambulatory Visit: Admitting: Pulmonary Disease

## 2024-09-19 VITALS — BP 134/70 | HR 56 | Ht 64.0 in | Wt 191.0 lb

## 2024-09-19 DIAGNOSIS — I2729 Other secondary pulmonary hypertension: Secondary | ICD-10-CM

## 2024-09-19 NOTE — Progress Notes (Signed)
 Carrie Guzman    [id number]    [birth year]  Primary Care Physician:Wile, Leita DEL, MD  Referring Physician: Stephane Leita DEL, MD [address] [place],  [place] [address]  Chief complaint:   Patient with history of PE, pulmonary hypertension She is in for follow-up today  HPI:  Continues to improve Exercising regularly Doing about 11 minutes of walking up to 4 times a day  Breathing is a whole lot better Energy levels a whole lot better  She does have severe pulmonary hypertension, she is following up with cardiology  Compliant with water pills, compliant with sildenafil   She has not been using her oxygen during the day and she measures oximetry, staying above 90 always We did discuss need to use oxygen at night and agreed to obtain an overnight oximetry  History of atrial fibrillation for which she had cardioversion  She has no chest pains or chest discomfort  She is feeling a lot better Continues on blood thinners  Past history of smoking quit in 1973 only smoked for about 8 years as a social smoker  History of chronic lower extremity edema, hypertension, morbid obesity  History of pulmonary embolism with right heart strain, remains on anticoagulants  Outpatient Encounter Medications as of 09/19/2024  Medication Sig   acetaminophen  (TYLENOL ) 650 MG CR tablet Take 1,300 mg by mouth every 8 (eight) hours as needed for pain.   albuterol  (VENTOLIN  HFA) 108 (90 Base) MCG/ACT inhaler Inhale 2 puffs into the lungs every 6 (six) hours as needed for wheezing or shortness of breath.   apixaban  (ELIQUIS ) 5 MG TABS tablet Take 1 tablet (5 mg total) by mouth 2 (two) times daily.   Cholecalciferol (VITAMIN D3) 125 MCG (5000 UT) CAPS as directed Orally   furosemide  (LASIX ) 40 MG tablet Take 2 tablets (80 mg total) by mouth daily.   Menthol, Topical Analgesic, (BIOFREEZE EX) Apply 1 Application topically as needed (pain).   Multiple Vitamins-Minerals (CENTRUM SILVER  50+WOMEN) TABS Take 1 tablet by mouth daily.   OVER THE COUNTER MEDICATION Take 1 tablet by mouth daily at 2 am. smooth move   potassium chloride  (KLOR-CON  M) 10 MEQ tablet Take 1 tablet (10 mEq total) by mouth daily.   rosuvastatin  (CRESTOR ) 10 MG tablet Take 1 tablet (10 mg total) by mouth daily.   sildenafil  (REVATIO ) 20 MG tablet Take 2 tablets (40 mg total) by mouth 3 (three) times daily.   sotalol  (BETAPACE ) 80 MG tablet Take 1 tablet (80 mg total) by mouth 2 (two) times daily.   spironolactone  (ALDACTONE ) 25 MG tablet Take 1 tablet (25 mg total) by mouth daily.   triamcinolone (KENALOG) 0.025 % ointment Apply 1 Application topically daily as needed (irritation).   No facility-administered encounter medications on file as of 09/19/2024.    Allergies as of 09/19/2024   (No Known Allergies)    Past Medical History:  Diagnosis Date   CHF (congestive heart failure) (HCC)    Elevated brain natriuretic peptide (BNP) level    Elevated cholesterol    Heart murmur    HLD (hyperlipidemia)    Hypertension    Lower extremity cellulitis    Lymphedema of lower extremity    Psoriasis    Squamous cell carcinoma of skin 05/12/2021   Left Lower Leg - anterior (in situ) (curet and 5FU)    Past Surgical History:  Procedure Laterality Date   ABDOMINAL HERNIA REPAIR     ABDOMINAL HYSTERECTOMY  ANKLE FRACTURE SURGERY     CARDIOVERSION N/A 12/01/2023   Procedure: CARDIOVERSION;  Surgeon: Zenaida Morene PARAS, MD;  Location: [facility] INVASIVE CV LAB;  Service: Cardiovascular;  Laterality: N/A;   CATARACT EXTRACTION     CHOLECYSTECTOMY     RIGHT HEART CATH N/A 10/10/2023   Procedure: RIGHT HEART CATH;  Surgeon: Zenaida Morene PARAS, MD;  Location: [facility] INVASIVE CV LAB;  Service: Cardiovascular;  Laterality: N/A;    Family History  Problem Relation Age of Onset   Hypertension Father    Heart attack Father    Psoriasis Father    Diabetes Maternal Grandfather     Social History   Socioeconomic  History   Marital status: Married    Spouse name: Not on file   Number of children: Not on file   Years of education: Not on file   Highest education level: Not on file  Occupational History   Not on file  Tobacco Use   Smoking status: Former    Types: Cigarettes    Passive exposure: Never   Smokeless tobacco: Never  Vaping Use   Vaping status: Never Used  Substance and Sexual Activity   Alcohol use: Never   Drug use: Never   Sexual activity: Not on file  Other Topics Concern   Not on file  Social History Narrative   Not on file   Social Drivers of Health   Financial Resource Strain: Not on file  Food Insecurity: No Food Insecurity (06/21/2023)   Hunger Vital Sign    Worried About Running Out of Food in the Last Year: Never true    Ran Out of Food in the Last Year: Never true  Transportation Needs: No Transportation Needs (06/21/2023)   PRAPARE - Administrator, Civil Service (Medical): No    Lack of Transportation (Non-Medical): No  Physical Activity: Not on file  Stress: Not on file  Social Connections: Not on file  Intimate Partner Violence: Not At Risk (06/21/2023)   Humiliation, Afraid, Rape, and Kick questionnaire    Fear of Current or Ex-Partner: No    Emotionally Abused: No    Physically Abused: No    Sexually Abused: No    Review of Systems  Constitutional:  Negative for fatigue.  Respiratory:  Positive for shortness of breath.   Cardiovascular:  Positive for leg swelling.  Psychiatric/Behavioral:  Negative for sleep disturbance.     There were no vitals filed for this visit.  Physical Exam Constitutional:      Appearance: She is obese.  HENT:     Head: Normocephalic.     Mouth/Throat:     Mouth: Mucous membranes are moist.  Eyes:     General: No scleral icterus. Cardiovascular:     Rate and Rhythm: Normal rate and regular rhythm.     Heart sounds: No murmur heard.    No friction rub.  Pulmonary:     Effort: No respiratory  distress.     Breath sounds: No stridor. No wheezing or rhonchi.  Musculoskeletal:        General: Swelling present.     Cervical back: No rigidity or tenderness.  Neurological:     Mental Status: She is alert.  Psychiatric:        Mood and Affect: Mood normal.     Data Reviewed:  Pulmonary function test 11/06/2023 with significant bronchodilator response  Echocardiogram with grade 1 diastolic dysfunction, severely reduced right ventricular function, severely dilated right atrial size and  severe pulmonary hypertension  Recent sleep study 10/15/2023 with no significant sleep disordered breathing, O2 nadir of 86%  Cardiac catheterization with PA pressure of 94/25 with a mean of 48, wedge pressure of 15, PVR of 5.27 Woods units   Assessment:  History of pulmonary embolism - On anticoagulation - Continues on anticoagulation  Severe pulmonary hypertension - Continues to follow-up with cardiology -On sildenafil  and tolerating it okay - Did not tolerate selexipag  - She seems to be doing well  Deconditioning This has improved significantly since the last couple of visits  Pulmonary function test with significant bronchodilator response, however, has not really been using a rescue inhaler has been doing well  Nocturnal desaturations - She is wondering whether she needs to continue using oxygen as most of the time her oxygen levels have been stable during the day without oxygen on  Plan/Recommendations:  Obtain overnight oximetry  Encouraged to continue staying active  Continue vasodilator therapy  Follow-up in about 3 months  Encouraged to continue diuretics and anticoagulants  Encouraged to call with significant concerns  Use rescue inhaler as needed  The importance of staying active was discussed extensively  Jennet Epley MD [facility] 09/19/2024, 8:55 AM  CC: Stephane Leita DEL, MD

## 2024-09-19 NOTE — Patient Instructions (Signed)
 I will see you back in about 3 months  Continue to stay active  Continue with your water pills and the medication for your pulmonary hypertension  It is important to make sure you stay active  Use a rescue inhaler if you needed  We need to check your oxygen at night to make sure you do not need oxygen while you are sleeping, we will place a request-this has to be done with you off oxygen

## 2024-09-30 ENCOUNTER — Other Ambulatory Visit: Payer: Self-pay | Admitting: Cardiology

## 2024-09-30 DIAGNOSIS — I480 Paroxysmal atrial fibrillation: Secondary | ICD-10-CM

## 2024-09-30 NOTE — Telephone Encounter (Signed)
 Eliquis  5mg  refill request received. Patient is 78 years old, weight-86.6kg, Crea-0.66 on 07/12/24,  Diagnosis-Afib, and last seen by Dr. Zenaida on 07/30/24. Dose is appropriate based on dosing criteria. Will send in refill to requested pharmacy.

## 2024-10-08 ENCOUNTER — Other Ambulatory Visit (HOSPITAL_COMMUNITY): Payer: Self-pay | Admitting: Cardiology

## 2024-10-09 ENCOUNTER — Encounter: Payer: Self-pay | Admitting: Pulmonary Disease

## 2024-10-22 ENCOUNTER — Inpatient Hospital Stay (HOSPITAL_COMMUNITY): Admission: RE | Admit: 2024-10-22 | Discharge: 2024-10-22 | Attending: Cardiology | Admitting: Cardiology

## 2024-10-22 ENCOUNTER — Encounter (HOSPITAL_COMMUNITY): Payer: Self-pay | Admitting: Cardiology

## 2024-10-22 ENCOUNTER — Ambulatory Visit (HOSPITAL_COMMUNITY): Admission: RE | Admit: 2024-10-22 | Discharge: 2024-10-22 | Attending: Cardiology

## 2024-10-22 VITALS — BP 108/60 | HR 51 | Ht 64.0 in | Wt 192.0 lb

## 2024-10-22 DIAGNOSIS — I4891 Unspecified atrial fibrillation: Secondary | ICD-10-CM

## 2024-10-22 DIAGNOSIS — I2781 Cor pulmonale (chronic): Secondary | ICD-10-CM | POA: Diagnosis not present

## 2024-10-22 DIAGNOSIS — I272 Pulmonary hypertension, unspecified: Secondary | ICD-10-CM | POA: Insufficient documentation

## 2024-10-22 DIAGNOSIS — I5032 Chronic diastolic (congestive) heart failure: Secondary | ICD-10-CM

## 2024-10-22 DIAGNOSIS — Z86711 Personal history of pulmonary embolism: Secondary | ICD-10-CM | POA: Diagnosis not present

## 2024-10-22 DIAGNOSIS — I340 Nonrheumatic mitral (valve) insufficiency: Secondary | ICD-10-CM | POA: Diagnosis not present

## 2024-10-22 DIAGNOSIS — I480 Paroxysmal atrial fibrillation: Secondary | ICD-10-CM

## 2024-10-22 DIAGNOSIS — E669 Obesity, unspecified: Secondary | ICD-10-CM | POA: Diagnosis not present

## 2024-10-22 DIAGNOSIS — Z7901 Long term (current) use of anticoagulants: Secondary | ICD-10-CM | POA: Diagnosis not present

## 2024-10-22 DIAGNOSIS — I371 Nonrheumatic pulmonary valve insufficiency: Secondary | ICD-10-CM | POA: Diagnosis not present

## 2024-10-22 DIAGNOSIS — I499 Cardiac arrhythmia, unspecified: Secondary | ICD-10-CM | POA: Diagnosis not present

## 2024-10-22 DIAGNOSIS — Z6832 Body mass index (BMI) 32.0-32.9, adult: Secondary | ICD-10-CM | POA: Diagnosis not present

## 2024-10-22 DIAGNOSIS — I358 Other nonrheumatic aortic valve disorders: Secondary | ICD-10-CM | POA: Diagnosis not present

## 2024-10-22 DIAGNOSIS — I890 Lymphedema, not elsewhere classified: Secondary | ICD-10-CM | POA: Diagnosis not present

## 2024-10-22 DIAGNOSIS — Z79899 Other long term (current) drug therapy: Secondary | ICD-10-CM | POA: Diagnosis not present

## 2024-10-22 DIAGNOSIS — I3139 Other pericardial effusion (noninflammatory): Secondary | ICD-10-CM | POA: Diagnosis not present

## 2024-10-22 DIAGNOSIS — I517 Cardiomegaly: Secondary | ICD-10-CM | POA: Diagnosis not present

## 2024-10-22 LAB — ECHOCARDIOGRAM COMPLETE
AR max vel: 1.9 cm2
AV Area VTI: 1.76 cm2
AV Area mean vel: 1.77 cm2
AV Mean grad: 8.7 mmHg
AV Peak grad: 11.6 mmHg
Ao pk vel: 1.7 m/s
Area-P 1/2: 2.6 cm2
Calc EF: 37.6 %
S' Lateral: 3.1 cm
Single Plane A2C EF: 32.9 %
Single Plane A4C EF: 48.8 %

## 2024-10-22 NOTE — Progress Notes (Signed)
 PULMONARY HYPERTENSION FOLLOW UP CLINIC NOTE  Referring Physician: Stephane Leita DEL, MD  Primary Care: Stephane Leita DEL, MD Primary Cardiologist:   HPI: Carrie Guzman is a 78 y.o. female with a PMH of lymphedema, thrombocytopenia, pericardial effusion who presents for follow up of pulmonary hypertension.      Patient had no previous cardiac history but was admitted to the hospital in August with concerns of shortness of breath ongoing for 2-3 weeks. Patient was diagnosed with CHF exacerbation. Echocardiogram showed preserved EF with elevated PASP, cor pulmonale and pericardial effusion without tamponade. CTA chest showed acute pulmonary embolism and right side heart strain. She was discharged with diuretics and seen in [facility] clinic for follow up.   She underwent RHC on 10/2023 that showed RA: 10, PA: 94/25 (48), PCWP 15, Fick CO/CI 6.27/3.16, PVR 5.27.   Patient was started on sildenafil  20 mg 3 times daily.  She presented to routine lab visit with new atrial fibrillation with RVR.  Cardioversion was arranged which was successfully completed after anticoagulation on 12/01/2023.  Called back in 3/7 with recurrent tachycardia and follow up was made.   600 feet on 01/15/24.   Started on selexipag  02/2024. Did not tolerate due to diarrhea.      SUBJECTIVE:  Echo reviewed today, persistently reduced RV function, but significantly improved from prior. TR jet inadequate to estimate TR.  She reports that she is doing fabulous and has been increasing her walking to 15 minutes three times a day. No issues with fluid retention.   PMH, current medications, allergies, social history, and family history reviewed in epic.  PHYSICAL EXAM: Vitals:   10/22/24 0846  BP: 108/60  Pulse: (!) 51  SpO2: 98%     GENERAL: NAD, well appearing PULM:  Normal work of breathing, CTAB CARDIAC:  JVP: flat         Normal rate with regular rhythm. No murmurs, rubs or gallops.  Chronic lower extremity  lymphedema. Warm and well perfused extremities. ABDOMEN: Soft, non-tender, non-distended. NEUROLOGIC: Patient is oriented x3 with no focal or lateralizing neurologic deficits.      DATA REVIEW  ECG: Sinus rhythm, right bundle branch block, frequent PACs.    ECHO: 06/2023: Severely dilated RV, elevated RVSP, near complete obliteration of LV, underfilled. 10/2024: RV mildly reduced, unable to estimate RVSP, abnormal septal motion, mildly dilated RV, collapsible IVC  CATH: 10/2023: RHC with RA: 10, PA 94/25 (48), PCWP 15, Fick CO/CI 6.27/3.16, PVR 5.27.  Etiology of PH: Primarily group I WHO Functional class: II : 182.54m Volume status:  Euvolemic  Pulmonary vasodilators: Sildenafil   Work up: [employer] Orders: Lab workup completed previously and normal Imaging: V/Q scan negative, sleep study negative Functional: PFTs consistent with restrictive process Hemodynamics: RHC as above   ASSESSMENT & PLAN:  Pulmonary hypertension: Predominant precapillary PH (Group I PAH) with elevated PVR. Has done well with sildenafil , improved functional class IV to II. Unfortunately did not tolerate selexipag . Not interested in further medication titration at this time given improved symptoms.  - Repeat echo as above, significantly improved RV function - Continue sildenafil  40mg  TID - Continue lasix  80mg  daily, euvolemic - Reviewed renal labs from 07/2024, repeat at next visit with -  Avoiding ERA given extensive lymphedema, borderline PCWP pressure, and prior overload with atrial fibrillation - Did not tolerate selexipag  - Continue sotalol  80mg  BID - Continue apixaban  5mg  BID - Continue spironolactone  25mg  daily  Atrial fibrillation: Cardioversion 11/2023. Sinus bradycardia today.  Stable  on current medical therapy.  -Continue sotalol  80mg  BID - Continue apixaban  5mg  BID  Obesity: Body mass index is 32.96 kg/m. - Significantly improved, near 80lb weight loss  Hypercalcemia: Bone scan  negative, follows with hematology.  Pulmonary embolism: Prior, V/Q scan negative. - Follows with hematology who reports need for lifelong OAC (will need given Afib)  Risk score reevaluation at next visit  Morene Brownie, MD [facility] Mechanical Circulatory Support 10/22/2024

## 2024-10-22 NOTE — Patient Instructions (Signed)
 There has been no changes to your medications.  Your physician recommends that you schedule a follow-up appointment in: 4 months ( April 2026) ** PLEASE CALL THE OFFICE IN FEBRUARY TO ARRANGE YOUR FOLLOW UP APPOINTMENT.**  If you have any questions or concerns before your next appointment please send us  a message through [place] or call our office at [phone].    TO LEAVE A MESSAGE FOR THE NURSE SELECT OPTION 2, PLEASE LEAVE A MESSAGE INCLUDING: YOUR NAME DATE OF BIRTH CALL BACK NUMBER REASON FOR CALL**this is important as we prioritize the call backs  YOU WILL RECEIVE A CALL BACK THE SAME DAY AS LONG AS YOU CALL BEFORE 4:00 PM  At the [facility], you and your health needs are our priority. As part of our continuing mission to provide you with exceptional heart care, we have created designated Provider Care Teams. These Care Teams include your primary Cardiologist (physician) and Advanced Practice Providers (APPs- Physician Assistants and Nurse Practitioners) who all work together to provide you with the care you need, when you need it.   You may see any of the following providers on your designated Care Team at your next follow up: Dr Toribio Fuel Dr Ezra Shuck Dr. Morene Brownie Greig Mosses, NP Caffie Shed, [place] [facility] [place], [place] Beckey Coe, NP Jordan Lee, NP Ellouise Class, NP Tinnie Redman, PharmD Jaun Bash, PharmD   Please be sure to bring in all your medications bottles to every appointment.    Thank you for choosing [facility]

## 2024-10-24 ENCOUNTER — Other Ambulatory Visit (HOSPITAL_COMMUNITY): Payer: Self-pay | Admitting: Cardiology

## 2024-10-24 ENCOUNTER — Ambulatory Visit: Admitting: Podiatry

## 2024-10-24 DIAGNOSIS — I480 Paroxysmal atrial fibrillation: Secondary | ICD-10-CM

## 2024-10-24 NOTE — Telephone Encounter (Signed)
 Pt called to request RX renewals for all medications  Educated all cardiac medications were sent to pharmacy on file 07/30/24 #90 pills with 3 refills. A RX renewal is not needed at this time

## 2024-11-05 ENCOUNTER — Telehealth: Payer: Self-pay

## 2024-11-05 NOTE — Telephone Encounter (Signed)
 Copied from CRM #[id number]. Topic: Clinical - Medical Advice >> Nov 05, 2024 11:10 AM Isabell A wrote: Reason for CRM: Patient states she took her oxygen test on 12/1 - would like to confirm if she can stop using her oxygen at night.   Callback number: [phone]  Dr. Neda can you advise of pulse oximetry results.

## 2024-11-06 ENCOUNTER — Ambulatory Visit: Admitting: Podiatry

## 2024-11-08 ENCOUNTER — Other Ambulatory Visit (HOSPITAL_COMMUNITY): Payer: Self-pay | Admitting: Cardiology

## 2024-11-08 DIAGNOSIS — I270 Primary pulmonary hypertension: Secondary | ICD-10-CM

## 2024-11-18 NOTE — Telephone Encounter (Signed)
 She continues to have significant desaturations without oxygen on the study that was performed 10/07/2084  She should continue with oxygen use

## 2024-11-21 NOTE — Telephone Encounter (Signed)
 Patient is aware,verbalized understanding.NFN

## 2024-12-04 ENCOUNTER — Ambulatory Visit: Admitting: Podiatry

## 2024-12-04 ENCOUNTER — Encounter: Payer: Self-pay | Admitting: Podiatry

## 2024-12-04 DIAGNOSIS — M79674 Pain in right toe(s): Secondary | ICD-10-CM | POA: Diagnosis not present

## 2024-12-04 DIAGNOSIS — M79675 Pain in left toe(s): Secondary | ICD-10-CM

## 2024-12-04 DIAGNOSIS — D696 Thrombocytopenia, unspecified: Secondary | ICD-10-CM

## 2024-12-04 DIAGNOSIS — B351 Tinea unguium: Secondary | ICD-10-CM

## 2024-12-04 NOTE — Progress Notes (Signed)
 This patient returns to my office for at risk foot care.  This patient requires this care by a professional since this patient will be at risk due to having coagulation defect and thrombocytopenia.  This patient is unable to cut nails herself since the patient cannot reach his nails.These nails are painful walking and wearing shoes.  This patient presents for at risk foot care today.  General Appearance  Alert, conversant and in no acute stress.  Vascular  Dorsalis pedis and posterior tibial  pulses are weakly  palpable  bilaterally.  Capillary return is within normal limits  bilaterally. Temperature is within normal limits  bilaterally.  Swelling legs/feet.  Neurologic  Senn-Weinstein monofilament wire test within normal limits  bilaterally. Muscle power within normal limits bilaterally.  Nails Thick disfigured discolored nails with subungual debris  from hallux to fifth toes bilaterally. No evidence of bacterial infection or drainage bilaterally.  Orthopedic  No limitations of motion  feet .  No crepitus or effusions noted.  No bony pathology or digital deformities noted.  Skin  normotropic skin with no porokeratosis noted bilaterally.  No signs of infections or ulcers noted.   Callus sub 5th met right foot.  Onychomycosis  Pain in right toes  Pain in left toes     Consent was obtained for treatment procedures.   Mechanical debridement of nails 1-5  bilaterally performed with a nail nipper.  Filed with dremel without incident.    Return office visit    10 weeks                  Told patient to return for periodic foot care and evaluation due to potential at risk complications.   Cordella Bold DPM

## 2024-12-13 ENCOUNTER — Ambulatory Visit: Admitting: Pulmonary Disease

## 2025-01-24 ENCOUNTER — Inpatient Hospital Stay

## 2025-01-28 ENCOUNTER — Inpatient Hospital Stay

## 2025-01-29 ENCOUNTER — Inpatient Hospital Stay: Admitting: Hematology

## 2025-02-04 ENCOUNTER — Inpatient Hospital Stay: Admitting: Hematology

## 2025-02-28 ENCOUNTER — Ambulatory Visit (HOSPITAL_COMMUNITY): Admitting: Cardiology

## 2025-03-05 ENCOUNTER — Ambulatory Visit: Admitting: Podiatry

## 2025-03-14 ENCOUNTER — Ambulatory Visit: Admitting: Pulmonary Disease
